# Patient Record
Sex: Male | Born: 1940
Health system: Southern US, Community
[De-identification: ages and names within clinical notes are randomized; demographics above are authoritative.]

## PROBLEM LIST (undated history)

## (undated) DIAGNOSIS — D509 Iron deficiency anemia, unspecified: Secondary | ICD-10-CM

## (undated) DIAGNOSIS — J189 Pneumonia, unspecified organism: Secondary | ICD-10-CM

## (undated) DIAGNOSIS — F32A Depression, unspecified: Secondary | ICD-10-CM

## (undated) DIAGNOSIS — K259 Gastric ulcer, unspecified as acute or chronic, without hemorrhage or perforation: Secondary | ICD-10-CM

## (undated) DIAGNOSIS — J449 Chronic obstructive pulmonary disease, unspecified: Secondary | ICD-10-CM

## (undated) DIAGNOSIS — L570 Actinic keratosis: Secondary | ICD-10-CM

## (undated) DIAGNOSIS — E039 Hypothyroidism, unspecified: Secondary | ICD-10-CM

## (undated) DIAGNOSIS — M810 Age-related osteoporosis without current pathological fracture: Secondary | ICD-10-CM

## (undated) DIAGNOSIS — G8929 Other chronic pain: Secondary | ICD-10-CM

## (undated) DIAGNOSIS — K3580 Unspecified acute appendicitis: Secondary | ICD-10-CM

## (undated) DIAGNOSIS — E785 Hyperlipidemia, unspecified: Secondary | ICD-10-CM

## (undated) DIAGNOSIS — K5730 Diverticulosis of large intestine without perforation or abscess without bleeding: Secondary | ICD-10-CM

## (undated) DIAGNOSIS — C679 Malignant neoplasm of bladder, unspecified: Secondary | ICD-10-CM

## (undated) DIAGNOSIS — Z8719 Personal history of other diseases of the digestive system: Secondary | ICD-10-CM

## (undated) DIAGNOSIS — F329 Major depressive disorder, single episode, unspecified: Secondary | ICD-10-CM

## (undated) DIAGNOSIS — M1990 Unspecified osteoarthritis, unspecified site: Secondary | ICD-10-CM

## (undated) DIAGNOSIS — I10 Essential (primary) hypertension: Secondary | ICD-10-CM

## (undated) DIAGNOSIS — M545 Low back pain, unspecified: Secondary | ICD-10-CM

## (undated) DIAGNOSIS — M48062 Spinal stenosis, lumbar region with neurogenic claudication: Secondary | ICD-10-CM

## (undated) HISTORY — PX: JOINT REPLACEMENT: SHX530

## (undated) HISTORY — PX: FIXATION KYPHOPLASTY LUMBAR SPINE: SHX1642

## (undated) HISTORY — DX: Actinic keratosis: L57.0

## (undated) HISTORY — PX: APPENDECTOMY: SHX54

## (undated) HISTORY — PX: CARDIAC CATHETERIZATION: SHX172

## (undated) HISTORY — PX: BACK SURGERY: SHX140

---

## 2002-05-13 ENCOUNTER — Encounter: Payer: Self-pay | Admitting: Orthopedic Surgery

## 2002-05-17 ENCOUNTER — Encounter: Payer: Self-pay | Admitting: Orthopedic Surgery

## 2002-05-17 ENCOUNTER — Inpatient Hospital Stay (HOSPITAL_COMMUNITY): Admission: RE | Admit: 2002-05-17 | Discharge: 2002-05-20 | Payer: Self-pay | Admitting: Orthopedic Surgery

## 2002-05-17 HISTORY — PX: TOTAL HIP ARTHROPLASTY: SHX124

## 2004-12-06 ENCOUNTER — Ambulatory Visit: Payer: Self-pay | Admitting: Gastroenterology

## 2008-06-02 ENCOUNTER — Ambulatory Visit: Payer: Self-pay | Admitting: Gastroenterology

## 2008-11-29 ENCOUNTER — Ambulatory Visit: Payer: Self-pay | Admitting: Cardiology

## 2009-01-09 ENCOUNTER — Ambulatory Visit: Payer: Self-pay | Admitting: Gastroenterology

## 2009-02-27 ENCOUNTER — Ambulatory Visit: Payer: Self-pay | Admitting: Gastroenterology

## 2010-06-09 ENCOUNTER — Ambulatory Visit: Payer: Self-pay | Admitting: Sports Medicine

## 2010-10-25 ENCOUNTER — Ambulatory Visit
Admission: RE | Admit: 2010-10-25 | Discharge: 2010-10-25 | Payer: Self-pay | Source: Home / Self Care | Attending: Family Medicine | Admitting: Family Medicine

## 2010-11-29 NOTE — Assessment & Plan Note (Signed)
Summary: FLU SHOT/JBB  Nurse Visit   Immunizations Administered:  Influenza Vaccine:    Vaccine Type: FLULAVAL    Site: left deltoid    Mfr: GlaxoSmithKline    Dose: 0.5 ml    Route: IM    Given by: Levonne Spiller EMT-P    Exp. Date: 04/27/2011    Lot #: [id number]    VIS given: 05/22/10 version given October 25, 2010.   Immunizations Administered:  Influenza Vaccine:    Vaccine Type: FLULAVAL    Site: left deltoid    Mfr: GlaxoSmithKline    Dose: 0.5 ml    Route: IM    Given by: Levonne Spiller EMT-P    Exp. Date: 04/27/2011    Lot #: [id number]    VIS given: 05/22/10 version given October 25, 2010.  Flu Vaccine Consent Questions:    Do you have a history of severe allergic reactions to this vaccine? no    Any prior history of allergic reactions to egg and/or gelatin? no    Do you have a sensitivity to the preservative Thimersol? no    Do you have a past history of Guillan-Barre Syndrome? no    Do you currently have an acute febrile illness? no    Have you ever had a severe reaction to latex? no    Vaccine information given and explained to patient? yes

## 2011-03-12 ENCOUNTER — Ambulatory Visit: Payer: Self-pay

## 2011-03-15 ENCOUNTER — Ambulatory Visit: Payer: Self-pay | Admitting: Unknown Physician Specialty

## 2011-03-15 NOTE — Discharge Summary (Signed)
NAME:  Thomas Palmer, Thomas Palmer                  ACCOUNT NO.:  [id number]   MEDICAL RECORD NO.:  [id number]                   PATIENT TYPE:  INP   LOCATION:  5035                                 FACILITY:  [facility]   PHYSICIAN:  Jamelle Rushing, P.A.                DATE OF BIRTH:  Feb 07, 1941   DATE OF ADMISSION:  05/17/2002  DATE OF DISCHARGE:  05/20/2002                                 DISCHARGE SUMMARY   ADMISSION DIAGNOSES:  1. Osteoarthritis right hip.  2. Thyroid disease.   DISCHARGE DIAGNOSES:  1. Right total hip arthroplasty.  2. Thyroid disease.   HISTORY OF PRESENT ILLNESS:  The patient is a 70year-old white male with a  three to four-year history of gradually onset progressively worsening right  hip pain.  The pain has significantly worsened over the last six months.  It  is now a constant dull aching sensation on the posterior aspect of the hip  with sharp, shooting pains with awkward movements.  The pain is present with  laying down, sitting, and standing.  He does have night pain.  He has a  sense of catching in the hip with range of motion and has noted loss of  external rotation of the foot with pain and blocking.  He does note a  grinding sensation of the hip with ambulation.   ALLERGIES:  No known drug allergies   CURRENT MEDICATIONS:  1. Levoxyl 0.125 mg p.o. q.d.  2. Vioxx 25 mg p.o. q.d.  3. Aspirin 81 mg p.o. q.d.  4. Multivitamin 1 tablet p.o. q.d.   PAST SURGICAL HISTORY:  On May 17, 2002, the patient was taken to the  operating room by Dr. Mila Homer. Lucey, assisted by Dr. Jamelle Rushing, P.A.-  C.  Under general anesthesia, the patient underwent a right total hip  arthroplasty.  The procedure went well.  There were no complications.  There  were no drains left in place.  Estimated blood loss was 300 cc.  The patient  was transferred to the recovery room and then to the orthopedic floor in  good condition.   The following routine consults were  requested.  Physical therapy,  occupational therapy, case management.   HOSPITAL COURSE:  On May 17, 2002, the patient was admitted to [facility] under the care of Dr. Mila Homer. Lucey.  The patient was  taken to the OR where a right total hip arthroplasty was performed.  There  were no complications.  The patient tolerated the procedure well and was  transferred to the recovery room and then to the orthopedic floor for  routine postoperative care.  The patient was placed on Lovenox for routine  DVT prophylaxis.   The patient then incurred a total of three days of postoperative care on the  orthopedic floor in which the patient progressed and did very nicely with  physical therapy.  The patient had no  untorrid events.  His labs remained  stable, his vital signs stable, and he remained afebrile.  His wounds  remained benign for any signs of infection.  The leg remained  neurovascularly intact and he was very comfortable on Percocet p.r.n.   The patient worked very well and progressed very rapidly with physical  therapy and was ready for discharge home on postoperative day #3.   LABORATORY DATA:  CBC on May 20, 2002 showed a WBC of 8.7, hemoglobin 8.8,  hematocrit 25.7, platelets 149,000.  Routine chemistries on May 19, 2002  showed sodium of 129, potassium of 4.2, glucose of 158, BUN 19, creatinine  0.8.  Urinalysis on admission was normal.   DISCHARGE MEDICATIONS:  1. Colace 100 mg p.o. b.i.d.  2. Trinsicon 1 tablet p.o. t.i.d.  3. Laxative or enema of choice p.r.n.  4. Percocet 1-2 tablets q.4-6h. p.r.n.  5. Phenergan 25 mg p.o. q.4h. p.r.n.  6. Tylenol 650 mg p.o. q.4h. p.r.n.  7. Robaxin 500 mg p.o. q.6h. p.r.n.  8. Restoril 30 mg p.o. q.h.s. p.r.n.  9. Lovenox 30 mg subcutaneously q.12h.  10.      Synthroid 0.125 mg p.o. q.d.  11.      Vioxx 25 mg p.o. q.d.  12.      Multivitamin 1 tablet p.o. q.d.   DISCHARGE INSTRUCTIONS:  Continue home medications.   Percocet 1-2 tablets  q.4-6h. p.r.n.  Lovenox 40 mg injection once a day for 10 days.  Pain  management with Percocet as noted above.   ACTIVITY:  As instructed.   DIET:  No restrictions.   WOUND CARE:  Keep wound clean.  Change dressing daily.  Call if any signs of  an infection.   FOLLOW UP:  Call [phone] for an follow-up appointment in one week with Dr.  Mila Homer. Lucey.   CONDITION ON DISCHARGE:  His condition on discharge to home is listed as  improved and good.                                               Jamelle Rushing, P.A.    RWK/MEDQ  D:  06/04/2002  T:  06/09/2002  Job:  [id number]   cc:   Corliss [place]

## 2011-03-15 NOTE — H&P (Signed)
[place]  Patient:    Thomas Palmer, Thomas Palmer Visit Number: [id number] MRN: [id number]          Service Type: SUR Location: 5000 5035 01 Attending Physician:  Georgena Spurling Dictated by:   Jamelle Rushing, P.A. Admit Date:  05/17/2002                           History and Physical  DATE OF BIRTH:  06/06/1941  CHIEF COMPLAINT:  Right hip pain over the last three to four years.  HISTORY OF PRESENT ILLNESS:  The patient is a 70 year old white male with a complaint of progressively worsening right hip pain over the last three to four years.  Initially the pain was gradually increased in intensity, but since this last January it has significantly worsened at a far more rapid rate.  The patient does have a catching sensation with rotation of the hip. Otherwise he has a constant dull achy pain and with rotation and awkward turns at time, he does have a sharp shooting pain.  He does have pain at night.  The pain is located primarily in the deep right buttocks region.  He does note some loss of external rotation of the foot.  He does have a grinding sensation in the hip with ambulation.  DRUG ALLERGIES:  No known drug allergies.  CURRENT MEDICATIONS: 1. Levoxyl 0.125 mg p.o. q.d. 2. Vioxx 25 mg p.o. q.d. 3. Aspirin 81 mg p.o. q.d. 4. Multivitamins one tablet p.o. q.d.  PAST MEDICAL HISTORY:  Thyroid disease currently managed by Huntley Dec, M.D., at the [facility].  The patient denies any diabetes, hypertension, heart disease, ulcers, or respiratory problems.  PAST SURGICAL HISTORY:  None.  SOCIAL HISTORY:  The patient is a 70 year old white male who appears to be healthy.  He denies any history of smoking.  He does have one or two beers on a daily basis.  He is married.  He does have one daughter.  He lives in a [place] house.  He is currently employed in [employer].  His family physician is Huntley Dec, M.D., at the [facility],  [phone].  FAMILY MEDICAL HISTORY:  His mother is deceased from kidney failure due to hypertension.  His father is deceased from a heart attack.  The patient has one sister deceased from an acute asthma attack.  The patient has two sisters alive, both in good health.  The patient does have a strong family history of diabetes on his fathers side.  REVIEW OF SYSTEMS:  Positive for using reading glasses.  He does occasionally have shortness of breath with exertion, which is related to discomfort in his hip and loss of physical conditioning.  Otherwise the patient denies any other problems and any other sensory, respiratory, cardiac, GI, GU, hematologic, musculoskeletal, neurologic, or mental status problems not mentioned above.  PHYSICAL EXAMINATION:  Height is 5 feet 11 inches.  Weight is 205 pounds.  VITAL SIGNS:  The pulse is 72 and regular, respirations 12, temperature 99.2 degrees, and blood pressure 130/86.  GENERAL APPEARANCE:  This is a healthy-appearing white male who ambulates with a right-sided limp.  He is able to get on and off the exam table without any difficulty or significant obvious discomfort.  HEENT:  The head was normocephalic and atraumatic.  Nontender over maxillary and frontal sinuses.  Pupils equal, round, reactive, and accommodating to light.  Extraocular movements intact.  The sclerae are  nonicteric.  The conjunctivae are pink and moist.  External ears without deformities.  Canals patent.  TMs pearly gray and intact.  Gross hearing is intact.  The nasal septum is midline.  Mucous membranes pink and moist.  No polyps noted.  The oral buccal mucosa was pink and moist and without lesions.  Dentition was in good repair.  He does have a permanent partial plate on the upper left side. The uvula was midline and moved symmetrically with phonation.  He was able to swallow without difficulty.  CHEST:  The lung sounds were clear and equal bilaterally.  There were  no wheezes, rhonchi, or rubs noted.  HEART:  Regular rate and rhythm.  S1 and S2 were auscultated.  No murmurs, rubs, or gallops noted.  ABDOMEN:  Round, soft, and nontender.  Bowel sounds were normoactive throughout.  He had no tenderness to deep palpation and no hepatosplenomegaly. CVA was nontender to percussion.  EXTREMITIES:  The upper extremities were symmetric in size and shape.  He had good range of motion of his shoulders, elbows, and wrists without any difficulty.  Motor strength was 5/5.  The right hip had full extension and flexion up to 110 degrees.  He had 10 degrees of external rotation and 10 degrees of internal rotation limited by discomfort.  There were no mechanical symptoms with this exam.  The left hip had full extension and flexion up to 130 degrees.  He had 20-30 degrees of internal and external rotation.  Bilateral knees were symmetric in size and shape without any signs of erythema, ecchymosis, or effusion.  He was nontender along the medial or lateral joint line.  He had full extension and flexion back to 120 degrees.  No valgus or varus laxity.  No anterior or posterior drawer.  The calves were nontender.  The ankles were symmetrical with good dorsiflexion and plantar flexion.  NEUROLOGIC:  The patient was conscious, alert, and appropriate and held an easy conversation with the examiner.  Cranial nerves II-XII were grossly intact.  Deep tendon reflexes of the upper and lower extremities were symmetrical right to left.  The patient was grossly intact to light touch sensation from head to toe.  PERIPHERAL PULSES:  Cranial pulses were 2+, radial pulses 2+, and dorsalis pedis and posterior tibial pulses were 2+.  He had no lower extremity edema. He had a few scattered varicosities, but no pigmentation changes.  BREASTS:  Exam was deferred at this time.  RECTAL:  Exam was deferred at this time.  GENITOURINARY:  Exam was deferred at this  time.  IMPRESSION: 1. Osteoarthritis of the right hip. 2. Thyroid disease.   PLAN:  The patient will be admitted to [facility] on May 17, 2002, under the care of Georgena Spurling, M.D.  The patient will undergo all of the routine labs and tests prior to have a right total hip arthroplasty by Georgena Spurling, M.D. Dictated by:   Jamelle Rushing, P.A. Attending Physician:  Georgena Spurling DD:  05/13/02 TD:  05/17/02 Job: [id number]

## 2011-03-15 NOTE — Op Note (Signed)
[place]  Patient:    Thomas Palmer, Thomas Palmer Visit Number: [id number] MRN: [id number]          Service Type: SUR Location: 5000 5035 01 Attending Physician:  Georgena Spurling Dictated by:   Georgena Spurling, M.D. Proc. Date: 05/17/02 Admit Date:  05/17/2002 Discharge Date: 05/20/2002                             Operative Report  PREOPERATIVE DIAGNOSIS:  Right hip osteoarthritis.  POSTOPERATIVE DIAGNOSIS:  Right hip osteoarthritis.  OPERATION PERFORMED:  Right total hip arthroplasty.  SURGEON:  Georgena Spurling, M.D.  ASSISTANT:  Jamelle Rushing, P.A.  ANESTHESIA:  General.  INDICATIONS FOR PROCEDURE:  The patient is a 70 year old white male status post failure of conservative measures for treatment of osteoarthritis of the knee.  Informed consent was obtained.  DESCRIPTION OF PROCEDURE:  The patient was taken to the operating room, laid supine, administered general anesthesia with Foley catheter placement.  He was then placed in a left down, right up lateral decubitus position and the right hip was prepped and draped in the usual sterile fashion.  A straight incision was made with a #10 blade approximately four inches in length.  Dissection was continued down to the fascia lata and hemostasis was obtained.  I then made a straight incision in the fascia lata along the length of the incision and then placed a Charnley retractor in place.  I then performed a bursectomy.  Then dissected off the anterior one half of the gluteus medius, gluteus minimus and vastus lateralis as one complete sleeve.  I then removed the anterior hip capsule and dislocated the hip with external rotation and flexion with the leg off into a sterile pouch.  I then used a cutting guide to mark the femoral neck and cut it with a sagittal saw and removed the arthritic femoral head.  I then brought the leg back up under the table, then external rotation and laced a Hohmann  retractor anteriorly and posteriorly.  I then used extended Bovie and forceps to remove the labrum from 360 degrees and then released some of the inferior capsule.  I then changed sides of the table and reamed sequentially up to a 56 and placed in a 58 no hole, no spike, ____________ press-fit with 2 mm press-fit.  At this point I went back to the other side of the table, flexed and externally rotated the leg off the bed again and turned our attention to the femur.  I used a canal finder, a lateralizing reamer and sequentially broached from 9 up to 15.  I had cylindrically reamed up to 15 first.  I then left a 15 broach in place and trialed it with different liners and head and chose a standard offset liner with a 0 + 0 head.  I then removed all of the trial components, irrigated copiously and then placed in a 15 fully porous coated stem, tamped on a 0 head, tamped in a standard liner and located the hip.  Testing for stability was excellent.  I then irrigated once again, closed the minimus, medius, vastus lateralis sleeve through three drill holes and then reinforced with figure-of-eight #2 Ethibond sutures as well.  I then closed the fascia lata with interrupted #1 Vicryls and deep soft tissue with interrupted 0 Vicryls, the subcuticular with 2-0 Vicryls and skin staples. Dressed with Adaptic, 4 x 4s, sterile ABD and  Ioban drape.  The patient tolerated the procedure well.  COMPLICATIONS:  None.  DRAINS:  None.  ESTIMATED BLOOD LOSS:  300 cc. Dictated by:   Georgena Spurling, M.D. Attending Physician:  Georgena Spurling DD:  05/17/02 TD:  05/20/02 Job: [id number]

## 2011-03-19 ENCOUNTER — Ambulatory Visit: Payer: Self-pay | Admitting: Unknown Physician Specialty

## 2011-03-21 ENCOUNTER — Ambulatory Visit: Payer: Self-pay

## 2011-08-29 DIAGNOSIS — J189 Pneumonia, unspecified organism: Secondary | ICD-10-CM

## 2011-08-29 HISTORY — DX: Pneumonia, unspecified organism: J18.9

## 2011-09-08 ENCOUNTER — Inpatient Hospital Stay: Payer: Self-pay | Admitting: Internal Medicine

## 2012-12-28 ENCOUNTER — Ambulatory Visit: Payer: Self-pay | Admitting: Family Medicine

## 2012-12-28 LAB — CREATININE, SERUM
Creatinine: 0.88 mg/dL (ref 0.60–1.30)
EGFR (African American): >60
EGFR (Non-African Amer.): >60

## 2013-06-29 ENCOUNTER — Encounter (HOSPITAL_COMMUNITY): Payer: Self-pay | Admitting: Pharmacy Technician

## 2013-06-30 ENCOUNTER — Other Ambulatory Visit: Payer: Self-pay | Admitting: Orthopedic Surgery

## 2013-07-07 ENCOUNTER — Encounter (HOSPITAL_COMMUNITY)
Admission: RE | Admit: 2013-07-07 | Discharge: 2013-07-07 | Disposition: A | Discharge status: Home / Self Care | Payer: Medicare Other | Source: Ambulatory Visit | Attending: Orthopedic Surgery | Admitting: Orthopedic Surgery

## 2013-07-07 ENCOUNTER — Encounter (HOSPITAL_COMMUNITY): Payer: Self-pay

## 2013-07-07 DIAGNOSIS — Z01810 Encounter for preprocedural cardiovascular examination: Secondary | ICD-10-CM | POA: Insufficient documentation

## 2013-07-07 DIAGNOSIS — Z01818 Encounter for other preprocedural examination: Secondary | ICD-10-CM | POA: Insufficient documentation

## 2013-07-07 DIAGNOSIS — Z01812 Encounter for preprocedural laboratory examination: Secondary | ICD-10-CM | POA: Insufficient documentation

## 2013-07-07 HISTORY — DX: Depression, unspecified: F32.A

## 2013-07-07 HISTORY — DX: Personal history of other diseases of the digestive system: Z87.19

## 2013-07-07 HISTORY — DX: Hypothyroidism, unspecified: E03.9

## 2013-07-07 HISTORY — DX: Unspecified osteoarthritis, unspecified site: M19.90

## 2013-07-07 HISTORY — DX: Major depressive disorder, single episode, unspecified: F32.9

## 2013-07-07 HISTORY — DX: Pneumonia, unspecified organism: J18.9

## 2013-07-07 LAB — URINALYSIS, ROUTINE W REFLEX MICROSCOPIC
Glucose, UA: NEGATIVE mg/dL
Leukocytes, UA: NEGATIVE
Nitrite: NEGATIVE
Specific Gravity, Urine: 1.013 (ref 1.005–1.030)
pH: 7.5 (ref 5.0–8.0)

## 2013-07-07 LAB — TYPE AND SCREEN: ABO/RH(D): O POS

## 2013-07-07 LAB — CBC WITH DIFFERENTIAL/PLATELET
Basophils Absolute: 0.1 10*3/uL (ref 0.0–0.1)
Basophils Relative: 1 % (ref 0–1)
Eosinophils Relative: 3 % (ref 0–5)
HCT: 46.3 % (ref 39.0–52.0)
Lymphocytes Relative: 23 % (ref 12–46)
MCHC: 32.4 g/dL (ref 30.0–36.0)
Monocytes Absolute: 0.6 10*3/uL (ref 0.1–1.0)
Neutro Abs: 4.6 10*3/uL (ref 1.7–7.7)
Platelets: 219 10*3/uL (ref 150–400)
RDW: 13.6 % (ref 11.5–15.5)
WBC: 7.1 10*3/uL (ref 4.0–10.5)

## 2013-07-07 LAB — SURGICAL PCR SCREEN
MRSA, PCR: NEGATIVE
Staphylococcus aureus: NEGATIVE

## 2013-07-07 LAB — COMPREHENSIVE METABOLIC PANEL
ALT: 15 U/L (ref 0–53)
AST: 22 U/L (ref 0–37)
Albumin: 4 g/dL (ref 3.5–5.2)
Calcium: 9.6 mg/dL (ref 8.4–10.5)
Creatinine, Ser: 0.86 mg/dL (ref 0.50–1.35)
Sodium: 137 mEq/L (ref 135–145)

## 2013-07-07 LAB — PROTIME-INR: INR: 0.95 (ref 0.00–1.49)

## 2013-07-07 LAB — ABO/RH: ABO/RH(D): O POS

## 2013-07-07 LAB — APTT: aPTT: 33 seconds (ref 24–37)

## 2013-07-07 MED ORDER — CHLORHEXIDINE GLUCONATE 4 % EX LIQD: 60.0000 mL | Freq: Once | CUTANEOUS | Status: DC

## 2013-07-07 NOTE — Pre-Procedure Instructions (Signed)
Thomas Palmer.  07/07/2013   Your procedure is scheduled on:  Monday July 12, 2013  Report to [facility] at 0800 AM.  Call this number if you have problems the morning of surgery: [phone]   Remember:   Do not eat food or drink liquids after midnight Sunday.   Take these medicines the morning of surgery with A SIP OF WATER: Synthroid and Prozac. Stop  Aspirin  And all herbal medications/supplements, and NSAIDS.   Do not wear jewelry.  Do not wear lotions, powders, or perfumes. You may wear deodorant.             Men may shave face and neck.  Do not bring valuables to the hospital.  [facility] is not responsible for any belongings or valuables.  Contacts, dentures or bridgework may not be worn into surgery.  Leave suitcase in the car. After surgery it may be brought to your room.  For patients admitted to the hospital, checkout time is 11:00 AM the day of discharge.   Patients discharged the day of surgery will not be allowed to drive home.    Special Instructions: Incentive Spirometry - Practice and bring it with you on the day of surgery. Shower using CHG 2 nights before surgery and the night before surgery.  If you shower the day of surgery use CHG.  Use special wash - you have one bottle of CHG for all showers.  You should use approximately 1/3 of the bottle for each shower.   Please read over the following fact sheets that you were given: Pain Booklet, Coughing and Deep Breathing, Blood Transfusion Information, MRSA Information and Surgical Site Infection Prevention

## 2013-07-08 LAB — URINE CULTURE
Colony Count: NO GROWTH
Culture: NO GROWTH

## 2013-07-08 NOTE — Progress Notes (Signed)
Anesthesia Chart Review:  Patient is a 72 year old male scheduled for left THA on 07/12/13 by Dr. Sherlean Foot.  History includes former smoker, hypothyroidism, depression, PNA '12, arthritis, hiatal hernia, right THA 04/2002, kyphoplasty '13.  EKG on 07/07/13 showed NSR, right BBB.  CXR on 07/07/13 showed: No acute cardiopulmonary abnormality. Suspect hyperinflation. Moderate size hiatal hernia.  Preoperative labs noted.  Urine culture is still pending.  If no acute changes then anticipate that he can proceed as planned.    Velna Ochs [facility]/Anesthesiology Phone [phone] 07/08/2013 11:25 AM

## 2013-07-11 MED ORDER — TRANEXAMIC ACID 100 MG/ML IV SOLN
1000.0000 mg | INTRAVENOUS | Status: AC
  Administered 2013-07-12: 1000 mg via INTRAVENOUS
  Filled 2013-07-11: qty 10

## 2013-07-11 MED ORDER — CEFAZOLIN SODIUM-DEXTROSE 2-3 GM-% IV SOLR
2.0000 g | INTRAVENOUS | Status: AC
  Administered 2013-07-12: 2 g via INTRAVENOUS
  Filled 2013-07-11: qty 50

## 2013-07-12 ENCOUNTER — Encounter (HOSPITAL_COMMUNITY)
Admission: RE | Disposition: A | Discharge status: Home Health | Payer: Self-pay | Source: Ambulatory Visit | Attending: Orthopedic Surgery

## 2013-07-12 ENCOUNTER — Inpatient Hospital Stay (HOSPITAL_COMMUNITY): Payer: Medicare Other

## 2013-07-12 ENCOUNTER — Inpatient Hospital Stay (HOSPITAL_COMMUNITY)
Admission: RE | Admit: 2013-07-12 | Discharge: 2013-07-13 | DRG: 470 | Disposition: A | Discharge status: Home Health | Payer: Medicare Other | Source: Ambulatory Visit | Attending: Orthopedic Surgery | Admitting: Orthopedic Surgery

## 2013-07-12 ENCOUNTER — Encounter (HOSPITAL_COMMUNITY): Payer: Self-pay | Admitting: *Deleted

## 2013-07-12 ENCOUNTER — Encounter (HOSPITAL_COMMUNITY): Payer: Self-pay | Admitting: Vascular Surgery

## 2013-07-12 DIAGNOSIS — M1610 Unilateral primary osteoarthritis, unspecified hip: Principal | ICD-10-CM | POA: Diagnosis present

## 2013-07-12 DIAGNOSIS — Z23 Encounter for immunization: Secondary | ICD-10-CM

## 2013-07-12 DIAGNOSIS — Z87891 Personal history of nicotine dependence: Secondary | ICD-10-CM

## 2013-07-12 DIAGNOSIS — F329 Major depressive disorder, single episode, unspecified: Secondary | ICD-10-CM | POA: Diagnosis present

## 2013-07-12 DIAGNOSIS — M169 Osteoarthritis of hip, unspecified: Principal | ICD-10-CM | POA: Diagnosis present

## 2013-07-12 DIAGNOSIS — Z96649 Presence of unspecified artificial hip joint: Secondary | ICD-10-CM

## 2013-07-12 DIAGNOSIS — E039 Hypothyroidism, unspecified: Secondary | ICD-10-CM | POA: Diagnosis present

## 2013-07-12 DIAGNOSIS — F3289 Other specified depressive episodes: Secondary | ICD-10-CM | POA: Diagnosis present

## 2013-07-12 DIAGNOSIS — K449 Diaphragmatic hernia without obstruction or gangrene: Secondary | ICD-10-CM | POA: Diagnosis present

## 2013-07-12 DIAGNOSIS — D62 Acute posthemorrhagic anemia: Secondary | ICD-10-CM | POA: Diagnosis not present

## 2013-07-12 HISTORY — PX: TOTAL HIP ARTHROPLASTY: SHX124

## 2013-07-12 HISTORY — DX: Low back pain, unspecified: M54.50

## 2013-07-12 HISTORY — DX: Other chronic pain: G89.29

## 2013-07-12 HISTORY — DX: Low back pain: M54.5

## 2013-07-12 LAB — CBC
HCT: 40.3 % (ref 39.0–52.0)
Hemoglobin: 13.2 g/dL (ref 13.0–17.0)
MCH: 30.6 pg (ref 26.0–34.0)
MCV: 93.3 fL (ref 78.0–100.0)
Platelets: 183 10*3/uL (ref 150–400)
RBC: 4.32 MIL/uL (ref 4.22–5.81)
WBC: 11.9 10*3/uL — ABNORMAL HIGH (ref 4.0–10.5)

## 2013-07-12 LAB — CREATININE, SERUM: GFR calc Af Amer: >90 mL/min (ref >90)

## 2013-07-12 SURGERY — ARTHROPLASTY, HIP, TOTAL,POSTERIOR APPROACH
Anesthesia: General | Site: Hip | Laterality: Left | Wound class: Clean

## 2013-07-12 MED ORDER — TESTOSTERONE 12.5 MG/ACT (1%) TD GEL: 1.0000 | Freq: Every day | TRANSDERMAL | Status: DC

## 2013-07-12 MED ORDER — DOCUSATE SODIUM 100 MG PO CAPS
100.0000 mg | ORAL_CAPSULE | Freq: Two times a day (BID) | ORAL | Status: DC
  Administered 2013-07-12 – 2013-07-13 (×2): 100 mg via ORAL
  Filled 2013-07-12 (×3): qty 1

## 2013-07-12 MED ORDER — HYDROMORPHONE HCL PF 1 MG/ML IJ SOLN
0.2500 mg | INTRAMUSCULAR | Status: DC | PRN
  Administered 2013-07-12 (×4): 0.5 mg via INTRAVENOUS

## 2013-07-12 MED ORDER — METOCLOPRAMIDE HCL 10 MG PO TABS: 5.0000 mg | ORAL_TABLET | Freq: Three times a day (TID) | ORAL | Status: DC | PRN

## 2013-07-12 MED ORDER — CELECOXIB 200 MG PO CAPS
200.0000 mg | ORAL_CAPSULE | Freq: Two times a day (BID) | ORAL | Status: DC
  Administered 2013-07-12: 200 mg via ORAL
  Filled 2013-07-12 (×3): qty 1

## 2013-07-12 MED ORDER — PROPOFOL 10 MG/ML IV BOLUS
INTRAVENOUS | Status: DC | PRN
  Administered 2013-07-12: 150 mg via INTRAVENOUS
  Administered 2013-07-12: 50 mg via INTRAVENOUS

## 2013-07-12 MED ORDER — METOCLOPRAMIDE HCL 5 MG/ML IJ SOLN: 5.0000 mg | Freq: Three times a day (TID) | INTRAMUSCULAR | Status: DC | PRN

## 2013-07-12 MED ORDER — LUTEIN 20 MG PO CAPS: 1.0000 | ORAL_CAPSULE | Freq: Every day | ORAL | Status: DC

## 2013-07-12 MED ORDER — LEVOTHYROXINE SODIUM 137 MCG PO TABS
137.0000 ug | ORAL_TABLET | Freq: Every day | ORAL | Status: DC
  Administered 2013-07-13: 137 ug via ORAL
  Filled 2013-07-12 (×2): qty 1

## 2013-07-12 MED ORDER — ONDANSETRON HCL 4 MG PO TABS: 4.0000 mg | ORAL_TABLET | Freq: Four times a day (QID) | ORAL | Status: DC | PRN

## 2013-07-12 MED ORDER — OXYCODONE HCL 5 MG PO TABS
5.0000 mg | ORAL_TABLET | ORAL | Status: DC | PRN
  Administered 2013-07-12: 5 mg via ORAL
  Administered 2013-07-13: 10 mg via ORAL
  Administered 2013-07-13 (×2): 5 mg via ORAL
  Administered 2013-07-13: 10 mg via ORAL
  Filled 2013-07-12 (×2): qty 1
  Filled 2013-07-12 (×2): qty 2
  Filled 2013-07-12: qty 1

## 2013-07-12 MED ORDER — ROCURONIUM BROMIDE 100 MG/10ML IV SOLN
INTRAVENOUS | Status: DC | PRN
  Administered 2013-07-12: 40 mg via INTRAVENOUS
  Administered 2013-07-12: 10 mg via INTRAVENOUS

## 2013-07-12 MED ORDER — ALUM & MAG HYDROXIDE-SIMETH 200-200-20 MG/5ML PO SUSP: 30.0000 mL | ORAL | Status: DC | PRN

## 2013-07-12 MED ORDER — HYDROMORPHONE HCL PF 1 MG/ML IJ SOLN: 1.0000 mg | INTRAMUSCULAR | Status: DC | PRN

## 2013-07-12 MED ORDER — ACETAMINOPHEN 500 MG PO TABS
1000.0000 mg | ORAL_TABLET | Freq: Once | ORAL | Status: AC
  Administered 2013-07-12: 1000 mg via ORAL
  Filled 2013-07-12: qty 2

## 2013-07-12 MED ORDER — HYDROMORPHONE HCL PF 1 MG/ML IJ SOLN
INTRAMUSCULAR | Status: AC
  Filled 2013-07-12: qty 1

## 2013-07-12 MED ORDER — ONDANSETRON HCL 4 MG/2ML IJ SOLN: 4.0000 mg | Freq: Once | INTRAMUSCULAR | Status: DC | PRN

## 2013-07-12 MED ORDER — SENNOSIDES-DOCUSATE SODIUM 8.6-50 MG PO TABS: 1.0000 | ORAL_TABLET | Freq: Every evening | ORAL | Status: DC | PRN

## 2013-07-12 MED ORDER — FENTANYL CITRATE 0.05 MG/ML IJ SOLN
INTRAMUSCULAR | Status: DC | PRN
  Administered 2013-07-12: 150 ug via INTRAVENOUS
  Administered 2013-07-12 (×2): 100 ug via INTRAVENOUS

## 2013-07-12 MED ORDER — ENOXAPARIN SODIUM 40 MG/0.4ML ~~LOC~~ SOLN
40.0000 mg | SUBCUTANEOUS | Status: DC
  Administered 2013-07-13: 40 mg via SUBCUTANEOUS
  Filled 2013-07-12 (×2): qty 0.4

## 2013-07-12 MED ORDER — SODIUM CHLORIDE 0.9 % IR SOLN
Status: DC | PRN
  Administered 2013-07-12: 1000 mL

## 2013-07-12 MED ORDER — BUPIVACAINE LIPOSOME 1.3 % IJ SUSP
20.0000 mL | Freq: Once | INTRAMUSCULAR | Status: DC
  Filled 2013-07-12: qty 20

## 2013-07-12 MED ORDER — LIDOCAINE HCL (CARDIAC) 20 MG/ML IV SOLN
INTRAVENOUS | Status: DC | PRN
  Administered 2013-07-12: 70 mg via INTRAVENOUS

## 2013-07-12 MED ORDER — OXYCODONE HCL ER 10 MG PO T12A
10.0000 mg | EXTENDED_RELEASE_TABLET | Freq: Two times a day (BID) | ORAL | Status: DC
  Administered 2013-07-12 – 2013-07-13 (×2): 10 mg via ORAL
  Filled 2013-07-12 (×2): qty 1

## 2013-07-12 MED ORDER — INFLUENZA VAC SPLIT QUAD 0.5 ML IM SUSP
0.5000 mL | INTRAMUSCULAR | Status: AC
  Administered 2013-07-13: 0.5 mL via INTRAMUSCULAR
  Filled 2013-07-12: qty 0.5

## 2013-07-12 MED ORDER — FLUOXETINE HCL 20 MG PO CAPS
20.0000 mg | ORAL_CAPSULE | Freq: Every day | ORAL | Status: DC
  Administered 2013-07-12 – 2013-07-13 (×2): 20 mg via ORAL
  Filled 2013-07-12 (×2): qty 1

## 2013-07-12 MED ORDER — PNEUMOCOCCAL VAC POLYVALENT 25 MCG/0.5ML IJ INJ
0.5000 mL | INJECTION | INTRAMUSCULAR | Status: AC
  Administered 2013-07-13: 0.5 mL via INTRAMUSCULAR
  Filled 2013-07-12: qty 0.5

## 2013-07-12 MED ORDER — ZOLPIDEM TARTRATE 5 MG PO TABS: 5.0000 mg | ORAL_TABLET | Freq: Every evening | ORAL | Status: DC | PRN

## 2013-07-12 MED ORDER — TESTOSTERONE 50 MG/5GM (1%) TD GEL: 5.0000 g | Freq: Every day | TRANSDERMAL | Status: DC

## 2013-07-12 MED ORDER — ACETAMINOPHEN 650 MG RE SUPP: 650.0000 mg | Freq: Four times a day (QID) | RECTAL | Status: DC | PRN

## 2013-07-12 MED ORDER — ONDANSETRON HCL 4 MG/2ML IJ SOLN: 4.0000 mg | Freq: Four times a day (QID) | INTRAMUSCULAR | Status: DC | PRN

## 2013-07-12 MED ORDER — PHENOL 1.4 % MT LIQD: 1.0000 | OROMUCOSAL | Status: DC | PRN

## 2013-07-12 MED ORDER — MENTHOL 3 MG MT LOZG: 1.0000 | LOZENGE | OROMUCOSAL | Status: DC | PRN

## 2013-07-12 MED ORDER — VECURONIUM BROMIDE 10 MG IV SOLR
INTRAVENOUS | Status: DC | PRN
  Administered 2013-07-12: 2 mg via INTRAVENOUS
  Administered 2013-07-12: 3 mg via INTRAVENOUS

## 2013-07-12 MED ORDER — DIPHENHYDRAMINE HCL 12.5 MG/5ML PO ELIX: 12.5000 mg | ORAL_SOLUTION | ORAL | Status: DC | PRN

## 2013-07-12 MED ORDER — SODIUM CHLORIDE 0.9 % IV SOLN: INTRAVENOUS | Status: DC

## 2013-07-12 MED ORDER — ACETAMINOPHEN 325 MG PO TABS: 650.0000 mg | ORAL_TABLET | Freq: Four times a day (QID) | ORAL | Status: DC | PRN

## 2013-07-12 MED ORDER — METHOCARBAMOL 100 MG/ML IJ SOLN
500.0000 mg | Freq: Four times a day (QID) | INTRAVENOUS | Status: DC | PRN
  Filled 2013-07-12: qty 5

## 2013-07-12 MED ORDER — MIDAZOLAM HCL 5 MG/5ML IJ SOLN
INTRAMUSCULAR | Status: DC | PRN
  Administered 2013-07-12: 2 mg via INTRAVENOUS

## 2013-07-12 MED ORDER — ONDANSETRON HCL 4 MG/2ML IJ SOLN
INTRAMUSCULAR | Status: DC | PRN
  Administered 2013-07-12: 4 mg via INTRAVENOUS

## 2013-07-12 MED ORDER — LACTATED RINGERS IV SOLN
INTRAVENOUS | Status: DC
  Administered 2013-07-12 (×2): via INTRAVENOUS

## 2013-07-12 MED ORDER — BISACODYL 5 MG PO TBEC: 5.0000 mg | DELAYED_RELEASE_TABLET | Freq: Every day | ORAL | Status: DC | PRN

## 2013-07-12 MED ORDER — NEOSTIGMINE METHYLSULFATE 1 MG/ML IJ SOLN
INTRAMUSCULAR | Status: DC | PRN
  Administered 2013-07-12: 2.5 mg via INTRAVENOUS

## 2013-07-12 MED ORDER — METHOCARBAMOL 500 MG PO TABS
500.0000 mg | ORAL_TABLET | Freq: Four times a day (QID) | ORAL | Status: DC | PRN
  Administered 2013-07-13 (×2): 500 mg via ORAL
  Filled 2013-07-12: qty 1

## 2013-07-12 MED ORDER — EPHEDRINE SULFATE 50 MG/ML IJ SOLN
INTRAMUSCULAR | Status: DC | PRN
  Administered 2013-07-12: 15 mg via INTRAVENOUS

## 2013-07-12 MED ORDER — CEFAZOLIN SODIUM-DEXTROSE 2-3 GM-% IV SOLR
2.0000 g | Freq: Four times a day (QID) | INTRAVENOUS | Status: AC
  Administered 2013-07-12 (×2): 2 g via INTRAVENOUS
  Filled 2013-07-12 (×2): qty 50

## 2013-07-12 MED ORDER — CHLORHEXIDINE GLUCONATE 4 % EX LIQD: 60.0000 mL | Freq: Once | CUTANEOUS | Status: DC

## 2013-07-12 MED ORDER — GLYCOPYRROLATE 0.2 MG/ML IJ SOLN
INTRAMUSCULAR | Status: DC | PRN
  Administered 2013-07-12: .5 mg via INTRAVENOUS

## 2013-07-12 MED ORDER — FLEET ENEMA 7-19 GM/118ML RE ENEM: 1.0000 | ENEMA | Freq: Once | RECTAL | Status: AC | PRN

## 2013-07-12 SURGICAL SUPPLY — 54 items
BLADE SAW RECIP 87.9 MT (BLADE) ×2 IMPLANT
BRUSH FEMORAL CANAL (MISCELLANEOUS) IMPLANT
CABLE CERLAGE W/CRIMP 1.8 (Cable) ×2 IMPLANT
CAP POR ST CUP XLPE LN STD HD ×2 IMPLANT
CLOTH BEACON ORANGE TIMEOUT ST (SAFETY) ×2 IMPLANT
COVER BACK TABLE 24X17X13 BIG (DRAPES) IMPLANT
COVER SURGICAL LIGHT HANDLE (MISCELLANEOUS) ×2 IMPLANT
DRAPE HIP W/POCKET STRL (DRAPE) ×2 IMPLANT
DRAPE INCISE IOBAN 66X45 STRL (DRAPES) ×2 IMPLANT
DRAPE U-SHAPE 47X51 STRL (DRAPES) ×2 IMPLANT
DRILL BIT 7/64X5 (BIT) ×2 IMPLANT
DRSG MEPILEX BORDER 4X12 (GAUZE/BANDAGES/DRESSINGS) ×2 IMPLANT
DRSG MEPILEX BORDER 4X8 (GAUZE/BANDAGES/DRESSINGS) IMPLANT
DURAPREP 26ML APPLICATOR (WOUND CARE) ×2 IMPLANT
ELECT BLADE 4.0 EZ CLEAN MEGAD (MISCELLANEOUS) ×2
ELECT CAUTERY BLADE 6.4 (BLADE) ×2 IMPLANT
ELECT REM PT RETURN 9FT ADLT (ELECTROSURGICAL) ×2
ELECTRODE BLDE 4.0 EZ CLN MEGD (MISCELLANEOUS) ×1 IMPLANT
ELECTRODE REM PT RTRN 9FT ADLT (ELECTROSURGICAL) ×1 IMPLANT
FACESHIELD LNG OPTICON STERILE (SAFETY) ×2 IMPLANT
GLOVE BIOGEL M 7.0 STRL (GLOVE) IMPLANT
GLOVE BIOGEL PI IND STRL 7.5 (GLOVE) IMPLANT
GLOVE BIOGEL PI IND STRL 8.5 (GLOVE) ×2 IMPLANT
GLOVE BIOGEL PI INDICATOR 7.5 (GLOVE)
GLOVE BIOGEL PI INDICATOR 8.5 (GLOVE) ×2
GLOVE SURG ORTHO 8.0 STRL STRW (GLOVE) ×4 IMPLANT
GOWN PREVENTION PLUS XLARGE (GOWN DISPOSABLE) ×2 IMPLANT
GOWN STRL NON-REIN LRG LVL3 (GOWN DISPOSABLE) ×4 IMPLANT
HANDPIECE INTERPULSE COAX TIP (DISPOSABLE)
HOOD PEEL AWAY FACE SHEILD DIS (HOOD) ×8 IMPLANT
KIT BASIN OR (CUSTOM PROCEDURE TRAY) ×2 IMPLANT
KIT ROOM TURNOVER OR (KITS) ×2 IMPLANT
MANIFOLD NEPTUNE II (INSTRUMENTS) ×2 IMPLANT
NDL SUT 2 .5 CRC MAYO 1.732X (NEEDLE) ×1 IMPLANT
NEEDLE 21 GA WING INFUSION (NEEDLE) ×2 IMPLANT
NEEDLE MAYO TAPER (NEEDLE) ×1
NS IRRIG 1000ML POUR BTL (IV SOLUTION) ×2 IMPLANT
PACK TOTAL JOINT (CUSTOM PROCEDURE TRAY) ×2 IMPLANT
PAD ARMBOARD 7.5X6 YLW CONV (MISCELLANEOUS) ×4 IMPLANT
PASSER SUT SWANSON 36MM LOOP (INSTRUMENTS) ×2 IMPLANT
PILLOW ABDUCTION HIP (SOFTGOODS) IMPLANT
PRESSURIZER FEMORAL UNIV (MISCELLANEOUS) IMPLANT
SET HNDPC FAN SPRY TIP SCT (DISPOSABLE) IMPLANT
STAPLER VISISTAT 35W (STAPLE) ×2 IMPLANT
SUT ETHIBOND 2 V 37 (SUTURE) ×2 IMPLANT
SUT VIC AB 0 CTX 27 (SUTURE) ×4 IMPLANT
SUT VIC AB 1 CTB1 27 (SUTURE) ×4 IMPLANT
SUT VIC AB 2-0 CTB1 (SUTURE) ×2 IMPLANT
SYR 50ML LL SCALE MARK (SYRINGE) ×2 IMPLANT
TOWEL OR 17X24 6PK STRL BLUE (TOWEL DISPOSABLE) ×2 IMPLANT
TOWEL OR 17X26 10 PK STRL BLUE (TOWEL DISPOSABLE) ×2 IMPLANT
TOWER CARTRIDGE SMART MIX (DISPOSABLE) IMPLANT
TRAY FOLEY CATH 16FRSI W/METER (SET/KITS/TRAYS/PACK) ×2 IMPLANT
WATER STERILE IRR 1000ML POUR (IV SOLUTION) ×8 IMPLANT

## 2013-07-12 NOTE — H&P (Signed)
  NAME: Thomas Palmer. MRN:   [id number] DOB:   1941/03/07     HISTORY AND PHYSICAL  CHIEF COMPLAINT:  Left hip pain  HISTORY:   GERHARDT GLEED Palmeris a 72 y.o. male  with left  Hip Pain Patient complains of left hip pain. Onset of the symptoms was several years ago. Inciting event: known DJD. The patient reports the hip pain is worse with weight bearing. Associated symptoms: none. Aggravating symptoms include: any weight bearing, going up and down stairs and inactivity. Patient has had no prior hip problems. Previous visits for this problem: yes, last seen several weeks ago by me. Evaluation to date: plain films, which were abnormal  osteoarthrtis. Treatment to date: OTC analgesics, which have been not very effective, prescription analgesics, which have been not very effective, home exercise program, which has been not very effective and physical therapy, which has been not very effective.   Plan for left total hip replacement  PAST MEDICAL HISTORY:   Past Medical History  Diagnosis Date  . Hypothyroidism   . Depression   . Pneumonia 2012  . Arthritis   . H/O hiatal hernia     PAST SURGICAL HISTORY:   Past Surgical History  Procedure Laterality Date  . Joint replacement Right 2002    hip  . Back surgery  2013    kyphoplasty    MEDICATIONS:   No prescriptions prior to admission    ALLERGIES:  No Known Allergies  REVIEW OF SYSTEMS:   Negative except Thomas Palmer  FAMILY HISTORY:  No family history on file.  SOCIAL HISTORY:   reports that he quit smoking about 24 years ago. His smoking use included Pipe. He has never used smokeless tobacco. He reports that  drinks alcohol. His drug history is not on file.  PHYSICAL EXAM:  General appearance: alert, cooperative and no distress Resp: clear to auscultation bilaterally Cardio: regular rate and rhythm, S1, S2 normal, no murmur, click, rub or gallop Extremities: extremities normal, atraumatic, no cyanosis or edema and Homans  sign is negative, no sign of DVT Pulses: 2+ and symmetric Skin: Skin color, texture, turgor normal. No rashes or lesions Neurologic: Alert and oriented X 3, normal strength and tone. Normal symmetric reflexes. Normal coordination and gait    LABORATORY STUDIES: No results found for this basename: WBC, HGB, HCT, PLT,  in the last 72 hours  No results found for this basename: NA, K, CL, CO2, GLUCOSE, BUN, CREATININE, CALCIUM,  in the last 72 hours  STUDIES/RESULTS:  Dg Chest 2 View  07/07/2013   CLINICAL DATA:  72 year old male preoperative study for hip surgery. Hiatal hernia.  EXAM: CHEST  2 VIEW  COMPARISON:  None.  FINDINGS: Moderate size gastric hiatal hernia suspected. Other mediastinal contours are within normal limits. Previous lumbar spinal compression fracture status post augmentation. Somewhat large lung volumes. No pneumothorax, pulmonary edema, pleural effusion or confluent pulmonary opacity. Flowing osteophytes worsened asthma fight in the thoracic spine.  IMPRESSION: No acute cardiopulmonary abnormality. Suspect hyperinflation. Moderate size hiatal hernia.   Electronically Signed   By: Augusto Gamble M.D.   On: 07/07/2013 13:52    ASSESSMENT:  End stage osteoarthritis left hip        Active Problems:   * No active hospital problems. *    PLAN:  Left Primary Total Hip   Thomas Palmer 07/12/2013. 7:03 AM

## 2013-07-12 NOTE — Anesthesia Preprocedure Evaluation (Addendum)
Anesthesia Evaluation  Patient identified by MRN, date of birth, ID band Patient awake    Reviewed: Allergy & Precautions, H&P , NPO status , Patient's Chart, lab work & pertinent test results, reviewed documented beta blocker date and time   Airway       Dental   Pulmonary former smoker,          Cardiovascular Exercise Tolerance: Good II    Neuro/Psych    GI/Hepatic Neg liver ROS, hiatal hernia,   Endo/Other    Renal/GU negative Renal ROS     Musculoskeletal negative musculoskeletal ROS (+)   Abdominal   Peds  Hematology negative hematology ROS (+)   Anesthesia Other Findings   Reproductive/Obstetrics                          Anesthesia Physical Anesthesia Plan  ASA: II  Anesthesia Plan:    Post-op Pain Management:    Induction:   Airway Management Planned:   Additional Equipment:   Intra-op Plan:   Post-operative Plan:   Informed Consent:   Plan Discussed with:   Anesthesia Plan Comments:         Anesthesia Quick Evaluation

## 2013-07-12 NOTE — Anesthesia Procedure Notes (Signed)
Procedure Name: Intubation Date/Time: 07/12/2013 10:49 AM Performed by: Whitman Hero Pre-anesthesia Checklist: Patient identified, Emergency Drugs available, Suction available, Patient being monitored and Timeout performed Patient Re-evaluated:Patient Re-evaluated prior to inductionOxygen Delivery Method: Circle system utilized Preoxygenation: Pre-oxygenation with 100% oxygen Intubation Type: IV induction Ventilation: Mask ventilation without difficulty Laryngoscope Size: Mac and 3 Grade View: Grade I Tube type: Oral Tube size: 7.5 mm Number of attempts: 1 Airway Equipment and Method: Stylet Placement Confirmation: ETT inserted through vocal cords under direct vision,  positive ETCO2 and breath sounds checked- equal and bilateral Secured at: 22 cm Tube secured with: Tape Dental Injury: Teeth and Oropharynx as per pre-operative assessment

## 2013-07-12 NOTE — Transfer of Care (Signed)
Immediate Anesthesia Transfer of Care Note  Patient: Thomas Palmer.  Procedure(s) Performed: Procedure(s): TOTAL HIP ARTHROPLASTY (Left)  Patient Location: PACU  Anesthesia Type:General  Level of Consciousness: alert  and sedated  Airway & Oxygen Therapy: Patient Spontanous Breathing and Patient connected to face mask  Post-op Assessment: Report given to PACU RN and Post -op Vital signs reviewed and stable  Post vital signs: Reviewed and stable  Complications: No apparent anesthesia complications

## 2013-07-12 NOTE — Anesthesia Postprocedure Evaluation (Signed)
  Anesthesia Post-op Note  Patient: Thomas Palmer.  Procedure(s) Performed: Procedure(s): TOTAL HIP ARTHROPLASTY (Left)  Patient Location: PACU  Anesthesia Type:General  Level of Consciousness: awake, alert  and oriented  Airway and Oxygen Therapy: Patient Spontanous Breathing and Patient connected to nasal cannula oxygen  Post-op Pain: mild  Post-op Assessment: Post-op Vital signs reviewed, Patient's Cardiovascular Status Stable, Respiratory Function Stable, Patent Airway and Pain level controlled  Post-op Vital Signs: stable  Complications: No apparent anesthesia complications

## 2013-07-12 NOTE — Progress Notes (Signed)
Report given to rebecca slade rn as caregiver 

## 2013-07-12 NOTE — Progress Notes (Signed)
UR COMPLETED  

## 2013-07-12 NOTE — Op Note (Signed)
Total Hip Replacement Operative Report:  07/12/2013  3:24 PM  PATIENT:  Thomas Palmer.  72 y.o. male  PRE-OPERATIVE DIAGNOSIS:  osteoarthritis left hip  POST-OPERATIVE DIAGNOSIS:  osteoarthritis left hip  PROCEDURE:  Procedure(s): TOTAL HIP ARTHROPLASTY  SURGEON:  Surgeon(s): Dannielle Huh, MD  PHYSICIAN ASSISTANT: Altamese Cabal, [facility]  ANESTHESIA:   general  DRAINS: Hemovac and On-Q Marcaine Pain Pump  SPECIMEN: None  COUNTS:  Correct  DICTATION:   Indication for procedure:    The patient is a 72 y.o. male who has failed conservative measures for @PREOPDX @.  Informed consent was obtained prior to anesthesia.  The risks versus benefits of the surgery was explained in terms that they can and did understand.  Description:  The patient was taken to the operating and administered general anesthesia.  They were then place in the lateral decubitis position with an axillary roll under the axilla and all areas padded.  The hip was prepped and draped in the usual sterile fashion.  A curvilinear incision was made with a #10 blade.  A new blade was used to incise the adipose layer down to the fascia.  The cautery was used to obtain hemostasis in this layer.  The bovie was used to divide the fascia lata and a charnley retractor was placed to exposed the lateral aspect of the hip.  The anterior one half of the gluteus medius and vastus lateralis was incised and a continuous sleeve elevated of the femur, including the gluteus minimus.  This sleeve was tagged with #2 Ethibond sutures. A large curved homan was used to protect this sleeve and expose the anterior hip joint capsule.  An anterior hip capsulectomy was performed with the bovie and forceps.  The leg was extended and externally rotatated.  The neck cutting guide was laid on the femoral neck and marked.  The neck was then cut with a reciprocating saw.  The head and neck segment was removed with a reaming corkscrew device.  The  curved homan was repositioned over the anterior acetabular rim.  A second curved homan was place over the posterior acetabular lip and retracted the femur.  The labral was removed circumferentially with the bovie.  I then switched sides of the table to the front of the patient.  The acetabulum was reamed with the reamers up to 58 mm.  A trial was then placed and checked for stability and sizing.  The trial was then removed.  The fiber mesh titanium acetabular component was then opened and was tamped into place in appropriate abduction and anteversion.  I then went back to the posterior side of the table to prepare the femur.  The leg was flexed at the hip and knee and place in a sterile pouch off the table, exposing the cut surface of the femoral neck. A Mueller retractor was used to deliver the femoral neck for reaming.  The lateralizing reamer was used first to prevent varus positioning of the component.  I then sequentially ream the canal up to 15 mm. I then broached the femur up to the corresponding size to match the reamer.  I then used the calcar planer to plan the portion of the proud femoral neck above the final trial broach.  A +0 head by 32mm head was then trialed with reduction of the hip and full range of motion checked for adequate stability.  I then dislocated the hip and removed the trial femoral components.  I then copiously irrigated.  The size  15 femoral component was opened and then tamped down into the femoral canal with a mallet.  The +0 by 32mm head was tamped onto the clean morse taper of the femoral component.  The hip was then reduced and tested for stability.  I then repaired the vastus lateralis/gluteus medius/gluteus minimus sleeve to the trochanter through small drill holes.  I oversewed the interval with several figure eight #2 tevdec sutures.  The charnley retractor was removed and the fascia lata closed with a running #1 vicryl stitch.    The deep soft tissues were closed with  burried #0 vicryl sutures.  The subcutilar layer was closed with a running #2 vicryl stitch.  The skin was closed with skin staples.  The wound was dressed with a mepilex dressing.  The patient was then laid supine, awakened, extubated, and taken to the recovery room in stable condition.  EBL:  300cc DRAINS: None COMPLICATIONS:  None

## 2013-07-13 ENCOUNTER — Encounter (HOSPITAL_COMMUNITY): Payer: Self-pay | Admitting: Orthopedic Surgery

## 2013-07-13 LAB — BASIC METABOLIC PANEL
BUN: 12 mg/dL (ref 6–23)
CO2: 30 mEq/L (ref 19–32)
Chloride: 99 mEq/L (ref 96–112)
Creatinine, Ser: 0.82 mg/dL (ref 0.50–1.35)
GFR calc Af Amer: >90 mL/min (ref >90)
Glucose, Bld: 99 mg/dL (ref 70–99)
Potassium: 5 mEq/L (ref 3.5–5.1)

## 2013-07-13 LAB — CBC
HCT: 36.4 % — ABNORMAL LOW (ref 39.0–52.0)
Hemoglobin: 11.7 g/dL — ABNORMAL LOW (ref 13.0–17.0)
MCV: 93.1 fL (ref 78.0–100.0)
RBC: 3.91 MIL/uL — ABNORMAL LOW (ref 4.22–5.81)
RDW: 13.9 % (ref 11.5–15.5)
WBC: 8.2 10*3/uL (ref 4.0–10.5)

## 2013-07-13 MED ORDER — ENOXAPARIN SODIUM 40 MG/0.4ML ~~LOC~~ SOLN: 40.0000 mg | SUBCUTANEOUS | Status: DC

## 2013-07-13 MED ORDER — OXYCODONE HCL ER 10 MG PO T12A: 10.0000 mg | EXTENDED_RELEASE_TABLET | Freq: Two times a day (BID) | ORAL | Status: DC

## 2013-07-13 MED ORDER — OXYCODONE HCL 5 MG PO TABS: 5.0000 mg | ORAL_TABLET | ORAL | Status: DC | PRN

## 2013-07-13 MED ORDER — METHOCARBAMOL 500 MG PO TABS: 500.0000 mg | ORAL_TABLET | Freq: Four times a day (QID) | ORAL | Status: DC | PRN

## 2013-07-13 NOTE — Evaluation (Signed)
Physical Therapy Evaluation Patient Details Name: Thomas Palmer. MRN: [id number] DOB: September 05, 1941 Today's Date: 07/13/2013 Time: 4098-1191 PT Time Calculation (min): 23 min  PT Assessment / Plan / Recommendation History of Present Illness  s/p left anterior lateral THA  Clinical Impression  Pt is s/p left anterior lateral THA resulting in the deficits listed below (see PT Problem List). Plan to perform stair negotiation this PM.  Pt will benefit from skilled PT to increase their independence and safety with mobility to allow discharge to the venue listed below.      PT Assessment  Patient needs continued PT services    Follow Up Recommendations  Home health PT    Equipment Recommendations  None recommended by PT    Frequency 7X/week    Precautions / Restrictions Precautions Precautions: Anterior Hip;Fall Precaution Booklet Issued: Yes (comment) Precaution Comments: Anterior handout given and reviewed. Restrictions Weight Bearing Restrictions: Yes LLE Weight Bearing: Weight bearing as tolerated   Pertinent Vitals/Pain 6/10 left hip      Mobility  Bed Mobility Bed Mobility: Sit to Supine Sit to Supine: 4: Min assist;HOB flat Details for Bed Mobility Assistance: (A) to elevate LLE into bed with cues for proper technique to maintain hip precautions. Transfers Transfers: Sit to Stand;Stand to Sit Sit to Stand: 4: Min guard;From chair/3-in-1 Stand to Sit: 4: Min guard;To bed Details for Transfer Assistance: Minguard for safety with cues for hand placement Ambulation/Gait Ambulation/Gait Assistance: 4: Min guard;5: Supervision Ambulation Distance (Feet): 125 Feet Assistive device: Rolling walker Ambulation/Gait Assistance Details: Minguard for safety with cues for step to gait per MD orders.  Gait Pattern: Step-to pattern;Decreased stride length;Antalgic Gait velocity: decreased due to step to gait Stairs: No    Exercises Total Joint Exercises Ankle  Circles/Pumps: AROM;Both;10 reps Quad Sets: Strengthening;Both;5 reps Short Arc Quad: Strengthening;Left;5 reps Heel Slides: Strengthening;AAROM;5 reps;Left   PT Diagnosis: Difficulty walking  PT Problem List: Decreased strength;Decreased range of motion;Decreased activity tolerance;Decreased balance;Decreased mobility;Decreased knowledge of use of DME;Pain;Decreased knowledge of precautions PT Treatment Interventions: DME instruction;Gait training;Stair training;Functional mobility training;Therapeutic activities;Therapeutic exercise;Balance training;Patient/family education     PT Goals(Current goals can be found in the care plan section) Acute Rehab PT Goals Patient Stated Goal: To go home today. PT Goal Formulation: With patient Time For Goal Achievement: 07/20/13 Potential to Achieve Goals: Good  Visit Information  Last PT Received On: 07/13/13 Assistance Needed: +1 History of Present Illness: s/p left anterior lateral THA       Prior Functioning  Home Living Family/patient expects to be discharged to:: Private residence Living Arrangements: Spouse/significant other;Children Available Help at Discharge: Family Type of Home: House Home Access: Stairs to enter Secretary/administrator of Steps: 1 Home Layout: One level Home Equipment: Environmental consultant - 2 wheels Prior Function Level of Independence: Independent Communication Communication: No difficulties Dominant Hand: Right    Cognition  Cognition Arousal/Alertness: Awake/alert Behavior During Therapy: WFL for tasks assessed/performed Overall Cognitive Status: Within Functional Limits for tasks assessed    Extremity/Trunk Assessment Lower Extremity Assessment Lower Extremity Assessment: LLE deficits/detail LLE Deficits / Details: Limited due to pain and unable to fully assess due to surgery LLE: Unable to fully assess due to pain   Balance    End of Session PT - End of Session Equipment Utilized During Treatment: Gait  belt Activity Tolerance: Patient tolerated treatment well Patient left: in bed;with call bell/phone within reach Nurse Communication: Mobility status  GP     Salil Raineri 07/13/2013, 8:41 AM  Jake Shark, PT  DPT [phone]

## 2013-07-13 NOTE — Progress Notes (Signed)
SPORTS MEDICINE AND JOINT REPLACEMENT  Georgena Spurling, MD   Altamese Cabal, PA-C [address] [place], [place], [place]  [address]                             [phone]   PROGRESS NOTE  Subjective:  negative for Chest Pain  negative for Shortness of Breath  negative for Nausea/Vomiting   negative for Calf Pain  negative for Bowel Movement   Tolerating Diet: yes         Patient reports pain as 4 on 0-10 scale.    Objective: Vital signs in last 24 hours:   Patient Vitals for the past 24 hrs:  BP Temp Temp src Pulse Resp SpO2  07/13/13 0525 106/66 mmHg 98.6 F (37 C) Oral 77 19 97 %  07/13/13 0200 105/61 mmHg 98.6 F (37 C) Oral 78 19 96 %  07/12/13 2013 109/69 mmHg 98.4 F (36.9 C) Oral 91 18 97 %  07/12/13 2000 - - - - 18 95 %  07/12/13 1500 116/67 mmHg 97.8 F (36.6 C) - 73 16 96 %  07/12/13 1440 - 97.2 F (36.2 C) - 75 17 94 %  07/12/13 1435 117/63 mmHg - - 74 11 96 %  07/12/13 1430 - - - 73 14 94 %  07/12/13 1425 - - - 72 12 96 %  07/12/13 1419 102/60 mmHg - - 77 14 97 %  07/12/13 1415 - - - 70 17 93 %  07/12/13 1410 - - - 81 18 93 %  07/12/13 1405 91/49 mmHg - - 75 15 96 %  07/12/13 1400 - - - 72 13 97 %  07/12/13 1349 122/63 mmHg - - - 12 96 %  07/12/13 1345 - - - 74 16 94 %  07/12/13 1334 - - - 66 19 97 %  07/12/13 1330 113/74 mmHg - - 65 15 94 %  07/12/13 1319 - - - 64 16 100 %  07/12/13 1315 127/72 mmHg - - 108 18 100 %  07/12/13 1300 130/68 mmHg 97 F (36.1 C) - 79 17 98 %  07/12/13 0838 128/77 mmHg 98.3 F (36.8 C) Oral 69 20 96 %    @flow {1959:LAST@   Intake/Output from previous day:   09/15 0701 - 09/16 0700 In: 1600 [P.O.:600; I.V.:1000] Out: 550 [Urine:550]   Intake/Output this shift:       Intake/Output     09/15 0701 - 09/16 0700 09/16 0701 - 09/17 0700   P.O. 600    I.V. 1000    Total Intake 1600     Urine 550    Total Output 550     Net +1050          Urine Occurrence 1 x       LABORATORY DATA:  Recent Labs  07/07/13 1305  07/12/13 1819 07/13/13 0445  WBC 7.1 11.9* 8.2  HGB 15.0 13.2 11.7*  HCT 46.3 40.3 36.4*  PLT 219 183 171    Recent Labs  07/07/13 1305 07/12/13 1819 07/13/13 0445  NA 137  --  136  K 4.9  --  5.0  CL 97  --  99  CO2 29  --  30  BUN 12  --  12  CREATININE 0.86 0.80 0.82  GLUCOSE 95  --  99  CALCIUM 9.6  --  7.9*   Lab Results  Component Value Date   INR 0.95 07/07/2013  Examination:  General appearance: alert, cooperative and no distress Extremities: Homans sign is negative, no sign of DVT  Wound Exam: clean, dry, intact   Drainage:  Scant/small amount Serosanguinous exudate  Motor Exam: EHL and FHL Intact  Sensory Exam: Deep Peroneal normal   Assessment:    1 Day Post-Op  Procedure(s) (LRB): TOTAL HIP ARTHROPLASTY (Left)  ADDITIONAL DIAGNOSIS:  Active Problems:   * No active hospital problems. *  Acute Blood Loss Anemia   Plan: Physical Therapy as ordered Weight Bearing as Tolerated (WBAT)  DVT Prophylaxis:  Lovenox  DISCHARGE PLAN: Home  DISCHARGE NEEDS: HHPT, CPM, Walker and 3-in-1 comode seat         Cele Mote 07/13/2013, 8:05 AM

## 2013-07-13 NOTE — Plan of Care (Signed)
Problem: Consults Goal: Diagnosis- Total Joint Replacement Primary Total Hip Left     

## 2013-07-13 NOTE — Progress Notes (Signed)
07/13/13 Patient set up for HHPT with Gordy Clement by MD office. Spoke with patient, no change in d/c plan. Patient states that he already has rolling walker and 3N1, no equipment needs identified. Jacquelynn Cree RN,BSN, CCM

## 2013-07-13 NOTE — Progress Notes (Signed)
Physical Therapy Treatment Patient Details Name: Thomas Palmer. MRN: [id number] DOB: 1941/09/30 Today's Date: 07/13/2013 Time: 1137-1200 PT Time Calculation (min): 23 min  PT Assessment / Plan / Recommendation  History of Present Illness s/p left anterior lateral THA   PT Comments   Pt ready to eat when entering room however wanting to work with therapy prior to eating.  Pt able to ambulate and perform stair negotiation this session.  Wife present and educated pt's wife on HEP handout.    Follow Up Recommendations  Home health PT     Equipment Recommendations  None recommended by PT    Frequency 7X/week   Progress towards PT Goals Progress towards PT goals: Progressing toward goals  Plan Current plan remains appropriate    Precautions / Restrictions Precautions Precautions: Anterior Hip;Fall Precaution Booklet Issued: Yes (comment) Precaution Comments: Anterior handout given and reviewed with wife Restrictions Weight Bearing Restrictions: Yes LLE Weight Bearing: Weight bearing as tolerated   Pertinent Vitals/Pain Mild c/o pain does not rate    Mobility  Bed Mobility Bed Mobility: Sit to Supine Sit to Supine: 4: Min guard Details for Bed Mobility Assistance: Minguard for safety Transfers Transfers: Sit to Stand;Stand to Sit Sit to Stand: 4: Min guard;From chair/3-in-1;From bed Stand to Sit: 4: Min guard;To chair/3-in-1 Details for Transfer Assistance: Minguard for safety with cues for hand placement Ambulation/Gait Ambulation/Gait Assistance: 4: Min guard;5: Supervision Ambulation Distance (Feet): 125 Feet Assistive device: Rolling walker Ambulation/Gait Assistance Details: Minguard for safety with cues for turns. Step to per MD orders Gait Pattern: Step-to pattern;Decreased stride length;Antalgic Gait velocity: decreased due to step to gait Stairs: Yes Stairs Assistance: 4: Min guard Stair Management Technique: Backwards;No rails;With walker Number of  Stairs: 1    Exercises     PT Diagnosis:    PT Problem List:   PT Treatment Interventions:     PT Goals (current goals can now be found in the care plan section) Acute Rehab PT Goals Patient Stated Goal: To go home today. PT Goal Formulation: With patient Time For Goal Achievement: 07/20/13 Potential to Achieve Goals: Good  Visit Information  Last PT Received On: 07/13/13 Assistance Needed: +1 History of Present Illness: s/p left anterior lateral THA    Subjective Data  Subjective: I'm hungry and ready to eat. Patient Stated Goal: To go home today.   Cognition  Cognition Arousal/Alertness: Awake/alert Behavior During Therapy: WFL for tasks assessed/performed (slightly impuslive at times) Overall Cognitive Status: Within Functional Limits for tasks assessed    Balance     End of Session PT - End of Session Equipment Utilized During Treatment: Gait belt Activity Tolerance: Patient tolerated treatment well Patient left: in chair;with call bell/phone within reach Nurse Communication: Mobility status   GP     [name] 07/13/2013, 12:04 PM   Jake Shark, PT DPT [phone]

## 2013-07-13 NOTE — Evaluation (Signed)
Occupational Therapy Evaluation Patient Details Name: Thomas Palmer. MRN: [id number] DOB: November 12, 1940 Today's Date: 07/13/2013 Time: 8295-6213 OT Time Calculation (min): 16 min  OT Assessment / Plan / Recommendation History of present illness s/p left anterior lateral THA   Clinical Impression   Pt doing well and requires mod A with LB ADLs and min guard A with ADL mobility. Pt will have 24 hour assist/sup at home. All education completed and no further acute OT indicated at this time. Pt's wife present during session and there were provided with education and demo of ADL A/E use for home    OT Assessment  Patient does not need any further OT services    Follow Up Recommendations  No OT follow up    Barriers to Discharge      Equipment Recommendations  3 in 1 bedside comode;Other (comment) (ADL A/E kit)    Recommendations for Other Services    Frequency       Precautions / Restrictions Precautions Precautions: Anterior Hip;Fall Precaution Booklet Issued: Yes (comment) Precaution Comments: Avoid hyperextension of hip, avoid external rotation, avoid abduction Restrictions Weight Bearing Restrictions: Yes LLE Weight Bearing: Weight bearing as tolerated   Pertinent Vitals/Pain 4/10    ADL  Grooming: Performed;Wash/dry hands;Wash/dry face;Set up Where Assessed - Grooming: Unsupported sitting Upper Body Bathing: Simulated;Supervision/safety;Set up Lower Body Bathing: Simulated;Moderate assistance Upper Body Dressing: Performed;Supervision/safety;Set up Lower Body Dressing: Performed;Moderate assistance Toilet Transfer: Simulated;Min Pension scheme manager Method: Sit to stand Toileting - Clothing Manipulation and Hygiene: Minimal assistance Where Assessed - Toileting Clothing Manipulation and Hygiene: Standing Transfers/Ambulation Related to ADLs: pt declined transfer in bathroom ADL Comments: pt and wife provided with education and demo of ADL A/E    OT Diagnosis:     OT Problem List:   OT Treatment Interventions:     OT Goals(Current goals can be found in the care plan section) Acute Rehab OT Goals Patient Stated Goal: To go home today.  Visit Information  Last OT Received On: 07/13/13 Assistance Needed: +1 History of Present Illness: s/p left anterior lateral THA       Prior Functioning     Home Living Family/patient expects to be discharged to:: Private residence Living Arrangements: Spouse/significant other;Children Available Help at Discharge: Family Type of Home: House Home Access: Stairs to enter Secretary/administrator of Steps: 1 Home Layout: One level Home Equipment: Environmental consultant - 2 wheels Prior Function Level of Independence: Independent Communication Communication: No difficulties Dominant Hand: Right         Vision/Perception Vision - History Baseline Vision: Wears glasses all the time Patient Visual Report: No change from baseline Perception Perception: Within Functional Limits   Cognition  Cognition Arousal/Alertness: Awake/alert Behavior During Therapy: WFL for tasks assessed/performed Overall Cognitive Status: Within Functional Limits for tasks assessed    Extremity/Trunk Assessment Upper Extremity Assessment Upper Extremity Assessment: Overall WFL for tasks assessed     Mobility Bed Mobility Bed Mobility: Supine to Sit;Sitting - Scoot to [place] of Bed;Sit to Supine;Scooting to HOB Supine to Sit: 4: Min guard Sitting - Scoot to Edge of Bed: 4: Min guard Sit to Supine: 4: Min guard Scooting to [facility]: With rail;4: Min guard Details for Bed Mobility Assistance: Minguard for safety Transfers Sit to Stand: 4: Min guard;From chair/3-in-1;From bed Stand to Sit: 4: Min guard;To chair/3-in-1 Details for Transfer Assistance: pt declined transfers, wants to wait for PT to return for more walking     Exercise     Balance Balance Balance Assessed:  No   End of Session OT - End of Session Activity Tolerance:  Patient tolerated treatment well Patient left: in bed;with call bell/phone within reach;with family/visitor present  GO     Galen Manila 07/13/2013, 12:59 PM

## 2013-07-19 NOTE — Discharge Summary (Signed)
SPORTS MEDICINE & JOINT REPLACEMENT   Georgena Spurling, MD   Altamese Cabal, PA-C [address] [place], [place], [place]  [address]                             [phone]  PATIENT ID: Thomas Palmer.        MRN:  [id number]          DOB/AGE: 1941/03/17 / 72 y.o.    DISCHARGE SUMMARY  ADMISSION DATE:    07/12/2013 DISCHARGE DATE:  07/13/2013  ADMISSION DIAGNOSIS: osteoarthritis left hip    DISCHARGE DIAGNOSIS:  osteoarthritis left hip    ADDITIONAL DIAGNOSIS: Active Problems:   * No active hospital problems. *  Past Medical History  Diagnosis Date  . Hypothyroidism   . Depression   . H/O hiatal hernia   . Pneumonia 08/2011  . Arthritis     "joints in hips and fingers; shoulders too" (07/13/2013)  . Chronic lower back pain     PROCEDURE: Procedure(s): TOTAL HIP ARTHROPLASTY on 07/12/2013  CONSULTS:     HISTORY:  See H&P in chart  HOSPITAL COURSE:  Douglass [place]. is a 72 y.o. admitted on 07/12/2013 and found to have a diagnosis of osteoarthritis left hip.  After appropriate laboratory studies were obtained  they were taken to the operating room on 07/12/2013 and underwent Procedure(s): TOTAL HIP ARTHROPLASTY.   They were given perioperative antibiotics:  Anti-infectives   Start     Dose/Rate Route Frequency Ordered Stop   07/12/13 1700  ceFAZolin (ANCEF) IVPB 2 g/50 mL premix     2 g 100 mL/hr over 30 Minutes Intravenous Every 6 hours 07/12/13 1518 07/12/13 2251   07/12/13 0600  ceFAZolin (ANCEF) IVPB 2 g/50 mL premix     2 g 100 mL/hr over 30 Minutes Intravenous On call to O.R. 07/11/13 1407 07/12/13 1042    .  Tolerated the procedure well.  Placed with a foley intraoperatively.  Given Ofirmev at induction and for 48 hours.    POD# 1: Vital signs were stable.  Patient denied Chest pain, shortness of breath, or calf pain.  Patient was started on Lovenox 30 mg subcutaneously twice daily at 8am.  Consults to PT, OT, and care management were made.  The  patient was weight bearing as tolerated.  CPM was placed on the operative leg 0-90 degrees for 6-8 hours a day.  Incentive spirometry was taught.  Dressing was changed.  Marcaine pump and hemovac were discontinued.      POD #2, Continued  PT for ambulation and exercise program.  IV saline locked.  O2 discontinued.    The remainder of the hospital course was dedicated to ambulation and strengthening.   The patient was discharged on 1 day post op in  Good condition.  Blood products given:none  DIAGNOSTIC STUDIES: Recent vital signs: No data found.      Recent laboratory studies:  Recent Labs  07/12/13 1819 07/13/13 0445  WBC 11.9* 8.2  HGB 13.2 11.7*  HCT 40.3 36.4*  PLT 183 171    Recent Labs  07/12/13 1819 07/13/13 0445  NA  --  136  K  --  5.0  CL  --  99  CO2  --  30  BUN  --  12  CREATININE 0.80 0.82  GLUCOSE  --  99  CALCIUM  --  7.9*   Lab Results  Component Value Date  INR 0.95 07/07/2013     Recent Radiographic Studies :  Dg Chest 2 View  07/07/2013   CLINICAL DATA:  72 year old male preoperative study for hip surgery. Hiatal hernia.  EXAM: CHEST  2 VIEW  COMPARISON:  None.  FINDINGS: Moderate size gastric hiatal hernia suspected. Other mediastinal contours are within normal limits. Previous lumbar spinal compression fracture status post augmentation. Somewhat large lung volumes. No pneumothorax, pulmonary edema, pleural effusion or confluent pulmonary opacity. Flowing osteophytes worsened asthma fight in the thoracic spine.  IMPRESSION: No acute cardiopulmonary abnormality. Suspect hyperinflation. Moderate size hiatal hernia.   Electronically Signed   By: Augusto Gamble M.D.   On: 07/07/2013 13:52   Dg Pelvis Portable  07/12/2013   CLINICAL DATA:  Postop left hip.  EXAM: PORTABLE PELVIS  COMPARISON:  None  FINDINGS: Changes of left hip replacement. Normal AP alignment. No bony complicating feature. Soft tissue gas noted. Remote changes of right hip replacement.   IMPRESSION: Left hip replacement without visible complicating feature.   Electronically Signed   By: Charlett Nose M.D.   On: 07/12/2013 15:24   Dg Hip Portable 1 View Left  07/12/2013   *RADIOLOGY REPORT*  Clinical Data: Left hip replacement.  PORTABLE LEFT HIP - 1 VIEW  Comparison: None.  Findings: Cross-table lateral view of the left hip demonstrates a total arthroplasty in place.  Cerclage wire about the proximal femur is noted.  There is gas in the soft tissues from surgery and surgical staples are noted.  The device is located and no fracture is identified.  IMPRESSION: Left total hip replacement without acute abnormality.   Original Report Authenticated By: Holley Dexter, M.D.    DISCHARGE INSTRUCTIONS: Discharge Orders   Future Orders Complete By Expires   Call MD / Call 911  As directed    Comments:     If you experience chest pain or shortness of breath, CALL 911 and be transported to the hospital emergency room.  If you develope a fever above 101 F, pus (white drainage) or increased drainage or redness at the wound, or calf pain, call your surgeon's office.   Change dressing  As directed    Comments:     You may change your dressing on wednesday, then change the dressing daily with sterile 4 x 4 inch gauze dressing and paper tape.   Constipation Prevention  As directed    Comments:     Drink plenty of fluids.  Prune juice may be helpful.  You may use a stool softener, such as Colace (over the counter) 100 mg twice a day.  Use MiraLax (over the counter) for constipation as needed.   Diet - low sodium heart healthy  As directed    Driving restrictions  As directed    Comments:     No driving for 6 weeks   Follow the hip precautions as taught in Physical Therapy  As directed    Increase activity slowly as tolerated  As directed    Lifting restrictions  As directed    Comments:     No lifting for 6 weeks   TED hose  As directed    Comments:     Use stockings (TED hose) for 3  both weeks on both  leg(s).  You may remove them at night for sleeping.      DISCHARGE MEDICATIONS:     Medication List    STOP taking these medications       aspirin EC 81 MG  tablet      TAKE these medications       enoxaparin 40 MG/0.4ML injection  Commonly known as:  LOVENOX  Inject 0.4 mLs (40 mg total) into the skin daily.     ferrous sulfate 324 (65 FE) MG Tbec  Take 2 tablets by mouth daily.     FLUoxetine 20 MG capsule  Commonly known as:  PROZAC  Take 20 mg by mouth daily.     levothyroxine 137 MCG tablet  Commonly known as:  SYNTHROID, LEVOTHROID  Take 137 mcg by mouth daily before breakfast.     Lutein 20 MG Caps  Take 1 capsule by mouth daily.     methocarbamol 500 MG tablet  Commonly known as:  ROBAXIN  Take 1-2 tablets (500-1,000 mg total) by mouth every 6 (six) hours as needed.     multivitamin with minerals tablet  Take 1 tablet by mouth daily.     oxyCODONE 5 MG immediate release tablet  Commonly known as:  Oxy IR/ROXICODONE  Take 1-2 tablets (5-10 mg total) by mouth every 4 (four) hours as needed.     OxyCODONE 10 mg T12a 12 hr tablet  Commonly known as:  OXYCONTIN  Take 1 tablet (10 mg total) by mouth every 12 (twelve) hours.     Testosterone 12.5 MG/ACT (1%) Gel  Place 1 packet onto the skin daily.     vitamin C 1000 MG tablet  Take 1,000 mg by mouth daily.        FOLLOW UP VISIT:       Follow-up Information   Follow up with Raymon Mutton, MD. Call on 07/27/2013.   Specialty:  Orthopedic Surgery   Contact information:   [address] [place] [address] [phone]       Please follow up. [facility] [phone] home physical therapy, they will contact you)       DISPOSITION: HOME  CONDITION:  Good   Beckem Tomberlin 07/19/2013, 7:15 AM

## 2013-07-27 DIAGNOSIS — Z96649 Presence of unspecified artificial hip joint: Secondary | ICD-10-CM | POA: Insufficient documentation

## 2013-11-23 ENCOUNTER — Ambulatory Visit: Payer: Self-pay | Admitting: Gastroenterology

## 2013-11-30 ENCOUNTER — Ambulatory Visit: Payer: Self-pay | Admitting: Gastroenterology

## 2013-12-02 ENCOUNTER — Ambulatory Visit: Payer: Self-pay | Admitting: Gastroenterology

## 2013-12-30 ENCOUNTER — Ambulatory Visit: Payer: Self-pay | Admitting: Surgery

## 2013-12-30 LAB — CBC
HCT: 41.8 % (ref 40.0–52.0)
HGB: 13.7 g/dL (ref 13.0–18.0)
MCH: 28.1 pg (ref 26.0–34.0)
MCHC: 32.9 g/dL (ref 32.0–36.0)
MCV: 86 fL (ref 80–100)
PLATELETS: 185 10*3/uL (ref 150–440)
RBC: 4.88 10*6/uL (ref 4.40–5.90)
RDW: 13.9 % (ref 11.5–14.5)
WBC: 6.5 10*3/uL (ref 3.8–10.6)

## 2014-01-07 ENCOUNTER — Ambulatory Visit: Payer: Self-pay | Admitting: Surgery

## 2014-01-08 LAB — CBC WITH DIFFERENTIAL/PLATELET
Basophil #: 0 10*3/uL (ref 0.0–0.1)
Basophil %: 0.3 %
EOS ABS: 0.1 10*3/uL (ref 0.0–0.7)
EOS PCT: 1.4 %
HCT: 39.3 % — AB (ref 40.0–52.0)
HGB: 12.6 g/dL — ABNORMAL LOW (ref 13.0–18.0)
Lymphocyte #: 0.6 10*3/uL — ABNORMAL LOW (ref 1.0–3.6)
Lymphocyte %: 6.4 %
MCH: 27.6 pg (ref 26.0–34.0)
MCHC: 32.2 g/dL (ref 32.0–36.0)
MCV: 86 fL (ref 80–100)
Monocyte #: 0.9 x10 3/mm (ref 0.2–1.0)
Monocyte %: 10.4 %
NEUTROS ABS: 7.4 10*3/uL — AB (ref 1.4–6.5)
Neutrophil %: 81.5 %
PLATELETS: 155 10*3/uL (ref 150–440)
RBC: 4.58 10*6/uL (ref 4.40–5.90)
RDW: 14.1 % (ref 11.5–14.5)
WBC: 9.1 10*3/uL (ref 3.8–10.6)

## 2014-07-07 DIAGNOSIS — K259 Gastric ulcer, unspecified as acute or chronic, without hemorrhage or perforation: Secondary | ICD-10-CM | POA: Insufficient documentation

## 2014-07-07 DIAGNOSIS — D509 Iron deficiency anemia, unspecified: Secondary | ICD-10-CM | POA: Insufficient documentation

## 2014-09-01 ENCOUNTER — Other Ambulatory Visit: Payer: Self-pay | Admitting: Neurosurgery

## 2014-09-01 DIAGNOSIS — S32000S Wedge compression fracture of unspecified lumbar vertebra, sequela: Secondary | ICD-10-CM

## 2014-09-05 ENCOUNTER — Ambulatory Visit
Admission: RE | Admit: 2014-09-05 | Discharge: 2014-09-05 | Disposition: A | Discharge status: Home / Self Care | Payer: Commercial Managed Care - HMO | Source: Ambulatory Visit | Attending: Neurosurgery | Admitting: Neurosurgery

## 2014-09-05 DIAGNOSIS — S32000S Wedge compression fracture of unspecified lumbar vertebra, sequela: Secondary | ICD-10-CM

## 2014-11-29 DIAGNOSIS — M545 Low back pain: Secondary | ICD-10-CM | POA: Diagnosis not present

## 2014-11-29 DIAGNOSIS — Z6829 Body mass index (BMI) 29.0-29.9, adult: Secondary | ICD-10-CM | POA: Diagnosis not present

## 2014-11-29 DIAGNOSIS — S32000S Wedge compression fracture of unspecified lumbar vertebra, sequela: Secondary | ICD-10-CM | POA: Diagnosis not present

## 2015-01-02 DIAGNOSIS — M545 Low back pain: Secondary | ICD-10-CM | POA: Diagnosis not present

## 2015-01-02 DIAGNOSIS — M47816 Spondylosis without myelopathy or radiculopathy, lumbar region: Secondary | ICD-10-CM | POA: Diagnosis not present

## 2015-01-17 DIAGNOSIS — M545 Low back pain: Secondary | ICD-10-CM | POA: Diagnosis not present

## 2015-01-17 DIAGNOSIS — M47816 Spondylosis without myelopathy or radiculopathy, lumbar region: Secondary | ICD-10-CM | POA: Diagnosis not present

## 2015-02-02 DIAGNOSIS — M545 Low back pain: Secondary | ICD-10-CM | POA: Diagnosis not present

## 2015-02-02 DIAGNOSIS — M47816 Spondylosis without myelopathy or radiculopathy, lumbar region: Secondary | ICD-10-CM | POA: Diagnosis not present

## 2015-02-16 DIAGNOSIS — Z0100 Encounter for examination of eyes and vision without abnormal findings: Secondary | ICD-10-CM | POA: Diagnosis not present

## 2015-02-16 DIAGNOSIS — M545 Low back pain: Secondary | ICD-10-CM | POA: Diagnosis not present

## 2015-02-16 DIAGNOSIS — M47816 Spondylosis without myelopathy or radiculopathy, lumbar region: Secondary | ICD-10-CM | POA: Diagnosis not present

## 2015-02-18 NOTE — Discharge Summary (Signed)
PATIENT NAME:  Thomas Palmer, Thomas Palmer MR#:  [id number] DATE OF BIRTH:  1941-01-06  DATE OF ADMISSION:  01/07/2014 DATE OF DISCHARGE:  01/10/2014  HISTORY OF PRESENT ILLNESS: This 74 year old male was admitted with a history of epigastric pain and endoscopy findings of hiatus hernia. He had pain, particularly with bending over. Had recent findings of gastric mucosal erosions and taken pantoprazole. It appeared that his symptoms were related to the hiatus hernia, and repair was recommended for definitive treatment.   Details of the past medical history, physical findings and medicines are recorded on the typed H and [place]: The patient came in through the outpatient surgery department and was carried to the operating room, where he had laparoscopy. However, during the course of the procedure, it was found that there was a large amount of fat in the operative area, and it was necessary to convert to an open procedure. His hiatus hernia was repaired.   Postoperatively, he was treated with IV fluids and kept in the hospital for a period of observation. He later was able to begin a liquid diet and advance to full liquids, which he tolerated well. He had minimal postoperative pain and was in satisfactory condition on 01/10/2014 for discharge.   FINAL DIAGNOSIS: Hiatus hernia.   OPERATION: Laparoscopy, open hiatus hernia repair.   DISCHARGE INSTRUCTIONS: Wound care instructions given, and plans made for followup in the office.   ____________________________ J. Rochel Brome, MD jws:lb D: 01/25/2014 11:24:42 ET T: 01/25/2014 12:00:15 ET JOB#: [id number]  cc: Loreli Dollar, MD, <Dictator> Loreli Dollar MD ELECTRONICALLY SIGNED 01/27/2014 9:25

## 2015-02-18 NOTE — Op Note (Signed)
PATIENT NAME:  Thomas Palmer, Thomas Palmer MR#:  [id number] DATE OF BIRTH:  [birth year]  DATE OF PROCEDURE:  01/07/2014  PREOPERATIVE DIAGNOSIS: Paraesophageal hiatus hernia.   POSTOPERATIVE DIAGNOSIS: Paraesophageal hiatus hernia.   PROCEDURE PERFORMED: Laparoscopy followed by open hiatus hernia repair.   SURGEON: Loreli Dollar, MD  ASSISTANT: Lew Dawes. Oaks, MD  ANESTHESIA: General.   INDICATION: This 74 year old male has a history of bleeding erosions in the stomach and findings of hiatus hernia and symptoms of discomfort in the lower sternal area with activities such as bending over, which has been limiting his normal activities. Upper GI x-ray demonstrated paraesophageal hiatus hernia. Repair was recommended for definitive treatment.   DESCRIPTION OF PROCEDURE: The patient was placed on the operating table in the supine position under general endotracheal anesthesia. The abdomen was clipped and prepared with ChloraPrep and draped in a sterile manner.   A short incision was made above the umbilicus and carried down to the deep fascia, which was grasped with laryngeal hook and elevated. A Veress needle was inserted, aspirated, and irrigated with a saline solution. Next, the peritoneal cavity was inflated with carbon dioxide. The Veress needle was removed. The 10 mm cannula was inserted. The 10 mm 30 degree laparoscope was inserted to view the peritoneal cavity. The liver appeared normal. Brief survey of the visible colon and small bowel appeared typical. Next, another incision was made in the subxiphoid position to insert an 11 mm cannula, and another incision was made in the left upper quadrant at the costal margin at the anterior axillary line to insert an 11 mm cannula. Another incision was made in the right upper quadrant at the costal margin at the midclavicular line to insert an 11 mm cannula. Another incision was made midway between that site and the port site for the camera to insert a 12 mm  cannula, and then ultimately a sixth incision in the anterior axillary line at the right upper quadrant to introduce an 11 mm cannula for a total of 6 ports.   With the patient in the reverse Trendelenburg position, the fan retractor was introduced through the subxiphoid port and held in place with the Bookwalter mechanical arm to retract the left lobe of the liver, exposing the diaphragmatic hiatus. Next, traction was applied to the stomach and it was demonstrated there was a large hiatus hernia and a large portion of the stomach, probably 50%, was in the chest, and traction was applied as it was delivered down towards the abdomen.   There was a finding of a large amount of fatty tissue in the region of the hiatus, making exposure difficult. The dissection was begun with an incision of the gastrohepatic ligament using Harmonic scalpel for hemostasis, and an incision was made in the sac extending anteriorly from left to right. Next, with further dissection, the right crus of the diaphragm was identified. However, there was a lot of fatty tissue requiring traction and frequent repositioning of traction, which was primarily done with Babcock clamps and grasper. Further dissection was carried out along the right crus of the diaphragm and dissected hernia sac posteriorly. Visibility was difficult with large amount of fatty tissue and it appeared that progress was very slow, and subsequently there was some bleeding, appeared to be primarily venous bleeding, and at this point after suctioning and some persistent bleeding elected to convert to an open procedure. Therefore, the laparoscopic instruments were removed. An upper abdominal midline incision was made from the xiphoid process to the umbilicus,  incorporating the two midline incisions into this incision, and the peritoneal cavity was opened. Next, the [facility] retractor was used to retract the upper abdominal wall, and also the hepatophrenic ligament was incised  on the patient's left side, and the malleable retractor was placed over a moist lap pack to retract the left lobe of the liver, further exposing the diaphragmatic hiatus. Some clotted blood was suctioned, and several small bleeding points were cauterized with the Harmonic scalpel. It again appeared that exposure was difficult. I did use headlight and also called and asked Dr. Nestor Lewandowsky to come, and he helped with assistance and exposure and technique. Further dissection was carried out with somewhat tedious dissection, dissecting the sac using the Harmonic scalpel and with time, the left crus of the diaphragm was identified. Additional stomach was mobilized posteriorly, and subsequently with appropriate retraction with malleable retractors and moist lap packs, the left and right crura were further demonstrated, and also we could see that the stomach had been fully reduced into the peritoneal cavity. Next, repair was carried out with a row of 0 Surgilon sutures, suturing the left crus of the diaphragm to the right crus, and this was carried up until satisfactory narrowing of the hiatus around the esophagus, and at this point it appeared that hemostasis was subsequently intact. The repair looked good. It appeared that there was some satisfactory tissue for holding sutures. The operative field was inspected. All lap packs were removed. Count was correct. Next, with all instruments removed, the closure of the midline incision was carried out with 0 Maxon figure-of-eight sutures in the midline fascia, and all skin incisions were closed with clips. Dressings were applied with paper tape. The patient appeared to be in satisfactory condition and was prepared for transfer to the recovery room.    ____________________________ Lenna Sciara. Rochel Brome, MD jws:jcm D: 01/07/2014 13:37:40 ET T: 01/07/2014 16:53:17 ET JOB#: [id number]  cc: Loreli Dollar, MD, <Dictator> Loreli Dollar MD ELECTRONICALLY SIGNED 01/12/2014 17:47

## 2015-03-02 DIAGNOSIS — M545 Low back pain: Secondary | ICD-10-CM | POA: Diagnosis not present

## 2015-03-02 DIAGNOSIS — M47816 Spondylosis without myelopathy or radiculopathy, lumbar region: Secondary | ICD-10-CM | POA: Diagnosis not present

## 2015-03-03 DIAGNOSIS — Z0100 Encounter for examination of eyes and vision without abnormal findings: Secondary | ICD-10-CM | POA: Diagnosis not present

## 2015-03-20 DIAGNOSIS — M545 Low back pain: Secondary | ICD-10-CM | POA: Diagnosis not present

## 2015-03-29 DIAGNOSIS — R1030 Lower abdominal pain, unspecified: Secondary | ICD-10-CM | POA: Diagnosis not present

## 2015-03-29 DIAGNOSIS — E119 Type 2 diabetes mellitus without complications: Secondary | ICD-10-CM | POA: Diagnosis not present

## 2015-03-29 DIAGNOSIS — K219 Gastro-esophageal reflux disease without esophagitis: Secondary | ICD-10-CM | POA: Diagnosis not present

## 2015-04-04 DIAGNOSIS — R1030 Lower abdominal pain, unspecified: Secondary | ICD-10-CM | POA: Diagnosis not present

## 2015-04-06 DIAGNOSIS — E039 Hypothyroidism, unspecified: Secondary | ICD-10-CM | POA: Diagnosis not present

## 2015-04-06 DIAGNOSIS — E559 Vitamin D deficiency, unspecified: Secondary | ICD-10-CM | POA: Diagnosis not present

## 2015-04-06 DIAGNOSIS — R7309 Other abnormal glucose: Secondary | ICD-10-CM | POA: Diagnosis not present

## 2015-04-06 DIAGNOSIS — M545 Low back pain: Secondary | ICD-10-CM | POA: Diagnosis not present

## 2015-04-13 DIAGNOSIS — K5732 Diverticulitis of large intestine without perforation or abscess without bleeding: Secondary | ICD-10-CM | POA: Diagnosis not present

## 2015-04-13 DIAGNOSIS — F328 Other depressive episodes: Secondary | ICD-10-CM | POA: Diagnosis not present

## 2015-04-13 DIAGNOSIS — E039 Hypothyroidism, unspecified: Secondary | ICD-10-CM | POA: Diagnosis not present

## 2015-04-13 DIAGNOSIS — M545 Low back pain: Secondary | ICD-10-CM | POA: Diagnosis not present

## 2015-07-13 DIAGNOSIS — R109 Unspecified abdominal pain: Secondary | ICD-10-CM | POA: Diagnosis not present

## 2015-07-13 DIAGNOSIS — Z8719 Personal history of other diseases of the digestive system: Secondary | ICD-10-CM | POA: Diagnosis not present

## 2015-07-13 DIAGNOSIS — R197 Diarrhea, unspecified: Secondary | ICD-10-CM | POA: Diagnosis not present

## 2015-07-26 ENCOUNTER — Other Ambulatory Visit: Payer: Self-pay | Admitting: Gastroenterology

## 2015-07-26 DIAGNOSIS — R1084 Generalized abdominal pain: Secondary | ICD-10-CM

## 2015-07-31 ENCOUNTER — Ambulatory Visit
Admission: RE | Admit: 2015-07-31 | Discharge: 2015-07-31 | Disposition: A | Discharge status: Home / Self Care | Payer: Commercial Managed Care - HMO | Source: Ambulatory Visit | Attending: Gastroenterology | Admitting: Gastroenterology

## 2015-07-31 DIAGNOSIS — R634 Abnormal weight loss: Secondary | ICD-10-CM | POA: Diagnosis not present

## 2015-07-31 DIAGNOSIS — R10819 Abdominal tenderness, unspecified site: Secondary | ICD-10-CM | POA: Diagnosis not present

## 2015-07-31 DIAGNOSIS — K5730 Diverticulosis of large intestine without perforation or abscess without bleeding: Secondary | ICD-10-CM | POA: Insufficient documentation

## 2015-07-31 DIAGNOSIS — R197 Diarrhea, unspecified: Secondary | ICD-10-CM | POA: Diagnosis not present

## 2015-07-31 DIAGNOSIS — R1084 Generalized abdominal pain: Secondary | ICD-10-CM | POA: Insufficient documentation

## 2015-07-31 LAB — POCT I-STAT CREATININE: Creatinine, Ser: 0.9 mg/dL (ref 0.61–1.24)

## 2015-07-31 MED ORDER — IOHEXOL 300 MG/ML  SOLN
100.0000 mL | Freq: Once | INTRAMUSCULAR | Status: AC | PRN
  Administered 2015-07-31: 100 mL via INTRAVENOUS

## 2015-08-04 DIAGNOSIS — Z23 Encounter for immunization: Secondary | ICD-10-CM | POA: Diagnosis not present

## 2015-08-17 DIAGNOSIS — R197 Diarrhea, unspecified: Secondary | ICD-10-CM | POA: Diagnosis not present

## 2015-09-05 DIAGNOSIS — H524 Presbyopia: Secondary | ICD-10-CM | POA: Diagnosis not present

## 2015-09-05 DIAGNOSIS — H52223 Regular astigmatism, bilateral: Secondary | ICD-10-CM | POA: Diagnosis not present

## 2015-09-05 DIAGNOSIS — H5203 Hypermetropia, bilateral: Secondary | ICD-10-CM | POA: Diagnosis not present

## 2015-09-05 DIAGNOSIS — H2513 Age-related nuclear cataract, bilateral: Secondary | ICD-10-CM | POA: Diagnosis not present

## 2015-09-15 DIAGNOSIS — D2312 Other benign neoplasm of skin of left eyelid, including canthus: Secondary | ICD-10-CM | POA: Diagnosis not present

## 2015-09-15 DIAGNOSIS — D2310 Other benign neoplasm of skin of unspecified eyelid, including canthus: Secondary | ICD-10-CM | POA: Diagnosis not present

## 2015-09-15 DIAGNOSIS — H25813 Combined forms of age-related cataract, bilateral: Secondary | ICD-10-CM | POA: Diagnosis not present

## 2015-09-25 DIAGNOSIS — H25811 Combined forms of age-related cataract, right eye: Secondary | ICD-10-CM | POA: Diagnosis not present

## 2015-09-25 DIAGNOSIS — Z961 Presence of intraocular lens: Secondary | ICD-10-CM | POA: Diagnosis not present

## 2015-09-25 DIAGNOSIS — H2511 Age-related nuclear cataract, right eye: Secondary | ICD-10-CM | POA: Diagnosis not present

## 2015-10-26 DIAGNOSIS — Z125 Encounter for screening for malignant neoplasm of prostate: Secondary | ICD-10-CM | POA: Diagnosis not present

## 2015-10-26 DIAGNOSIS — F3289 Other specified depressive episodes: Secondary | ICD-10-CM | POA: Diagnosis not present

## 2015-10-26 DIAGNOSIS — E039 Hypothyroidism, unspecified: Secondary | ICD-10-CM | POA: Diagnosis not present

## 2015-10-26 DIAGNOSIS — M545 Low back pain: Secondary | ICD-10-CM | POA: Diagnosis not present

## 2015-10-26 DIAGNOSIS — G8929 Other chronic pain: Secondary | ICD-10-CM | POA: Diagnosis not present

## 2015-10-26 DIAGNOSIS — K5732 Diverticulitis of large intestine without perforation or abscess without bleeding: Secondary | ICD-10-CM | POA: Diagnosis not present

## 2015-11-02 DIAGNOSIS — M810 Age-related osteoporosis without current pathological fracture: Secondary | ICD-10-CM | POA: Diagnosis not present

## 2015-11-02 DIAGNOSIS — G8929 Other chronic pain: Secondary | ICD-10-CM | POA: Diagnosis not present

## 2015-11-02 DIAGNOSIS — M545 Low back pain: Secondary | ICD-10-CM

## 2015-11-02 DIAGNOSIS — Z136 Encounter for screening for cardiovascular disorders: Secondary | ICD-10-CM | POA: Diagnosis not present

## 2015-11-02 DIAGNOSIS — R7989 Other specified abnormal findings of blood chemistry: Secondary | ICD-10-CM | POA: Insufficient documentation

## 2015-11-02 DIAGNOSIS — K5730 Diverticulosis of large intestine without perforation or abscess without bleeding: Secondary | ICD-10-CM | POA: Diagnosis not present

## 2015-11-02 DIAGNOSIS — E291 Testicular hypofunction: Secondary | ICD-10-CM | POA: Diagnosis not present

## 2015-11-02 DIAGNOSIS — Z0000 Encounter for general adult medical examination without abnormal findings: Secondary | ICD-10-CM | POA: Diagnosis not present

## 2015-11-09 DIAGNOSIS — H2512 Age-related nuclear cataract, left eye: Secondary | ICD-10-CM | POA: Diagnosis not present

## 2015-11-09 DIAGNOSIS — H52223 Regular astigmatism, bilateral: Secondary | ICD-10-CM | POA: Diagnosis not present

## 2015-11-09 DIAGNOSIS — H25812 Combined forms of age-related cataract, left eye: Secondary | ICD-10-CM | POA: Diagnosis not present

## 2015-11-09 DIAGNOSIS — H5203 Hypermetropia, bilateral: Secondary | ICD-10-CM | POA: Diagnosis not present

## 2015-11-09 DIAGNOSIS — H2513 Age-related nuclear cataract, bilateral: Secondary | ICD-10-CM | POA: Diagnosis not present

## 2015-11-09 DIAGNOSIS — H524 Presbyopia: Secondary | ICD-10-CM | POA: Diagnosis not present

## 2015-11-21 ENCOUNTER — Other Ambulatory Visit: Payer: Self-pay | Admitting: Internal Medicine

## 2015-11-21 DIAGNOSIS — Z136 Encounter for screening for cardiovascular disorders: Secondary | ICD-10-CM

## 2015-11-24 ENCOUNTER — Ambulatory Visit: Admission: RE | Admit: 2015-11-24 | Payer: Commercial Managed Care - HMO | Source: Ambulatory Visit

## 2016-03-18 DIAGNOSIS — M5416 Radiculopathy, lumbar region: Secondary | ICD-10-CM | POA: Diagnosis not present

## 2016-04-22 DIAGNOSIS — M47816 Spondylosis without myelopathy or radiculopathy, lumbar region: Secondary | ICD-10-CM | POA: Diagnosis not present

## 2016-04-22 DIAGNOSIS — M5416 Radiculopathy, lumbar region: Secondary | ICD-10-CM | POA: Diagnosis not present

## 2016-04-24 DIAGNOSIS — M545 Low back pain: Secondary | ICD-10-CM | POA: Diagnosis not present

## 2016-04-24 DIAGNOSIS — M810 Age-related osteoporosis without current pathological fracture: Secondary | ICD-10-CM | POA: Diagnosis not present

## 2016-04-24 DIAGNOSIS — Z136 Encounter for screening for cardiovascular disorders: Secondary | ICD-10-CM | POA: Diagnosis not present

## 2016-04-24 DIAGNOSIS — K5730 Diverticulosis of large intestine without perforation or abscess without bleeding: Secondary | ICD-10-CM | POA: Diagnosis not present

## 2016-04-24 DIAGNOSIS — Z125 Encounter for screening for malignant neoplasm of prostate: Secondary | ICD-10-CM | POA: Diagnosis not present

## 2016-04-24 DIAGNOSIS — Z0000 Encounter for general adult medical examination without abnormal findings: Secondary | ICD-10-CM | POA: Diagnosis not present

## 2016-04-24 DIAGNOSIS — E291 Testicular hypofunction: Secondary | ICD-10-CM | POA: Diagnosis not present

## 2016-04-24 DIAGNOSIS — G8929 Other chronic pain: Secondary | ICD-10-CM | POA: Diagnosis not present

## 2016-05-01 DIAGNOSIS — G8929 Other chronic pain: Secondary | ICD-10-CM | POA: Diagnosis not present

## 2016-05-01 DIAGNOSIS — E291 Testicular hypofunction: Secondary | ICD-10-CM | POA: Diagnosis not present

## 2016-05-01 DIAGNOSIS — G5790 Unspecified mononeuropathy of unspecified lower limb: Secondary | ICD-10-CM | POA: Diagnosis not present

## 2016-05-01 DIAGNOSIS — K5730 Diverticulosis of large intestine without perforation or abscess without bleeding: Secondary | ICD-10-CM | POA: Diagnosis not present

## 2016-05-01 DIAGNOSIS — M545 Low back pain: Secondary | ICD-10-CM | POA: Diagnosis not present

## 2016-08-22 DIAGNOSIS — Z0100 Encounter for examination of eyes and vision without abnormal findings: Secondary | ICD-10-CM | POA: Diagnosis not present

## 2016-08-22 DIAGNOSIS — H2513 Age-related nuclear cataract, bilateral: Secondary | ICD-10-CM | POA: Diagnosis not present

## 2016-11-01 DIAGNOSIS — E291 Testicular hypofunction: Secondary | ICD-10-CM | POA: Diagnosis not present

## 2016-11-01 DIAGNOSIS — M545 Low back pain: Secondary | ICD-10-CM | POA: Diagnosis not present

## 2016-11-01 DIAGNOSIS — G5790 Unspecified mononeuropathy of unspecified lower limb: Secondary | ICD-10-CM | POA: Diagnosis not present

## 2016-11-01 DIAGNOSIS — K5730 Diverticulosis of large intestine without perforation or abscess without bleeding: Secondary | ICD-10-CM | POA: Diagnosis not present

## 2016-11-01 DIAGNOSIS — G8929 Other chronic pain: Secondary | ICD-10-CM | POA: Diagnosis not present

## 2016-11-01 DIAGNOSIS — E039 Hypothyroidism, unspecified: Secondary | ICD-10-CM | POA: Diagnosis not present

## 2016-11-07 DIAGNOSIS — E039 Hypothyroidism, unspecified: Secondary | ICD-10-CM | POA: Diagnosis not present

## 2016-11-07 DIAGNOSIS — K5909 Other constipation: Secondary | ICD-10-CM | POA: Diagnosis not present

## 2016-11-07 DIAGNOSIS — E291 Testicular hypofunction: Secondary | ICD-10-CM | POA: Diagnosis not present

## 2016-11-07 DIAGNOSIS — H918X9 Other specified hearing loss, unspecified ear: Secondary | ICD-10-CM | POA: Diagnosis not present

## 2016-11-07 DIAGNOSIS — Z0000 Encounter for general adult medical examination without abnormal findings: Secondary | ICD-10-CM | POA: Diagnosis not present

## 2016-11-07 DIAGNOSIS — G8929 Other chronic pain: Secondary | ICD-10-CM | POA: Diagnosis not present

## 2016-11-07 DIAGNOSIS — M545 Low back pain: Secondary | ICD-10-CM | POA: Diagnosis not present

## 2016-11-29 DIAGNOSIS — H903 Sensorineural hearing loss, bilateral: Secondary | ICD-10-CM | POA: Diagnosis not present

## 2016-11-29 DIAGNOSIS — H9311 Tinnitus, right ear: Secondary | ICD-10-CM | POA: Diagnosis not present

## 2016-12-24 DIAGNOSIS — H903 Sensorineural hearing loss, bilateral: Secondary | ICD-10-CM | POA: Diagnosis not present

## 2017-02-22 DIAGNOSIS — K3580 Unspecified acute appendicitis: Secondary | ICD-10-CM | POA: Diagnosis not present

## 2017-02-22 DIAGNOSIS — R1031 Right lower quadrant pain: Secondary | ICD-10-CM | POA: Diagnosis not present

## 2017-05-01 DIAGNOSIS — Z0000 Encounter for general adult medical examination without abnormal findings: Secondary | ICD-10-CM | POA: Diagnosis not present

## 2017-05-01 DIAGNOSIS — G8929 Other chronic pain: Secondary | ICD-10-CM | POA: Diagnosis not present

## 2017-05-01 DIAGNOSIS — Z125 Encounter for screening for malignant neoplasm of prostate: Secondary | ICD-10-CM | POA: Diagnosis not present

## 2017-05-01 DIAGNOSIS — E039 Hypothyroidism, unspecified: Secondary | ICD-10-CM | POA: Diagnosis not present

## 2017-05-01 DIAGNOSIS — H918X9 Other specified hearing loss, unspecified ear: Secondary | ICD-10-CM | POA: Diagnosis not present

## 2017-05-01 DIAGNOSIS — M545 Low back pain: Secondary | ICD-10-CM | POA: Diagnosis not present

## 2017-05-01 DIAGNOSIS — R7989 Other specified abnormal findings of blood chemistry: Secondary | ICD-10-CM | POA: Diagnosis not present

## 2017-05-01 DIAGNOSIS — K5909 Other constipation: Secondary | ICD-10-CM | POA: Diagnosis not present

## 2017-05-07 ENCOUNTER — Other Ambulatory Visit: Payer: Self-pay | Admitting: Internal Medicine

## 2017-05-07 DIAGNOSIS — M47816 Spondylosis without myelopathy or radiculopathy, lumbar region: Secondary | ICD-10-CM

## 2017-05-07 DIAGNOSIS — M48062 Spinal stenosis, lumbar region with neurogenic claudication: Secondary | ICD-10-CM

## 2017-05-07 DIAGNOSIS — R7989 Other specified abnormal findings of blood chemistry: Secondary | ICD-10-CM | POA: Diagnosis not present

## 2017-05-07 DIAGNOSIS — D500 Iron deficiency anemia secondary to blood loss (chronic): Secondary | ICD-10-CM | POA: Diagnosis not present

## 2017-05-07 DIAGNOSIS — E039 Hypothyroidism, unspecified: Secondary | ICD-10-CM | POA: Diagnosis not present

## 2017-05-07 DIAGNOSIS — R29818 Other symptoms and signs involving the nervous system: Secondary | ICD-10-CM

## 2017-05-07 DIAGNOSIS — G9519 Other vascular myelopathies: Secondary | ICD-10-CM

## 2017-05-14 ENCOUNTER — Ambulatory Visit
Admission: RE | Admit: 2017-05-14 | Discharge: 2017-05-14 | Disposition: A | Discharge status: Home / Self Care | Payer: Medicare HMO | Source: Ambulatory Visit | Attending: Internal Medicine | Admitting: Internal Medicine

## 2017-05-14 DIAGNOSIS — M47816 Spondylosis without myelopathy or radiculopathy, lumbar region: Secondary | ICD-10-CM | POA: Diagnosis not present

## 2017-05-14 DIAGNOSIS — M48062 Spinal stenosis, lumbar region with neurogenic claudication: Secondary | ICD-10-CM | POA: Insufficient documentation

## 2017-05-14 DIAGNOSIS — M5127 Other intervertebral disc displacement, lumbosacral region: Secondary | ICD-10-CM | POA: Insufficient documentation

## 2017-05-14 DIAGNOSIS — M5126 Other intervertebral disc displacement, lumbar region: Secondary | ICD-10-CM | POA: Insufficient documentation

## 2017-05-14 DIAGNOSIS — G9519 Other vascular myelopathies: Secondary | ICD-10-CM

## 2017-05-14 MED ORDER — GADOBENATE DIMEGLUMINE 529 MG/ML IV SOLN
20.0000 mL | Freq: Once | INTRAVENOUS | Status: AC | PRN
  Administered 2017-05-14: 20 mL via INTRAVENOUS

## 2017-05-29 DIAGNOSIS — M545 Low back pain: Secondary | ICD-10-CM | POA: Diagnosis not present

## 2017-05-29 DIAGNOSIS — M48062 Spinal stenosis, lumbar region with neurogenic claudication: Secondary | ICD-10-CM | POA: Diagnosis not present

## 2017-05-29 DIAGNOSIS — Z6826 Body mass index (BMI) 26.0-26.9, adult: Secondary | ICD-10-CM | POA: Diagnosis not present

## 2017-05-29 DIAGNOSIS — R030 Elevated blood-pressure reading, without diagnosis of hypertension: Secondary | ICD-10-CM | POA: Diagnosis not present

## 2017-06-10 ENCOUNTER — Other Ambulatory Visit: Payer: Self-pay | Admitting: Neurosurgery

## 2017-06-10 DIAGNOSIS — M48062 Spinal stenosis, lumbar region with neurogenic claudication: Secondary | ICD-10-CM

## 2017-06-23 ENCOUNTER — Ambulatory Visit
Admission: RE | Admit: 2017-06-23 | Discharge: 2017-06-23 | Disposition: A | Discharge status: Home / Self Care | Payer: Medicare HMO | Source: Ambulatory Visit | Attending: Neurosurgery | Admitting: Neurosurgery

## 2017-06-23 DIAGNOSIS — M4807 Spinal stenosis, lumbosacral region: Secondary | ICD-10-CM | POA: Diagnosis not present

## 2017-06-23 DIAGNOSIS — M48062 Spinal stenosis, lumbar region with neurogenic claudication: Secondary | ICD-10-CM

## 2017-06-23 MED ORDER — ONDANSETRON HCL 4 MG/2ML IJ SOLN: 4.0000 mg | Freq: Four times a day (QID) | INTRAMUSCULAR | Status: DC | PRN

## 2017-06-23 MED ORDER — IOPAMIDOL (ISOVUE-M 200) INJECTION 41%
15.0000 mL | Freq: Once | INTRAMUSCULAR | Status: AC
  Administered 2017-06-23: 15 mL via INTRATHECAL

## 2017-06-23 MED ORDER — DIAZEPAM 5 MG PO TABS
5.0000 mg | ORAL_TABLET | Freq: Once | ORAL | Status: AC
  Administered 2017-06-23: 5 mg via ORAL

## 2017-06-23 NOTE — Discharge Instructions (Signed)

## 2017-09-05 DIAGNOSIS — M545 Low back pain: Secondary | ICD-10-CM | POA: Diagnosis not present

## 2017-09-25 IMAGING — CT CT L SPINE W/ CM
1 of 6 series · 5 of 14 positions shown, 7 images · non-contrast
Comparison: 05/14/2017 lumbar spine MRI

CLINICAL DATA: Low back pain and bilateral lower extremity
weakness, worse with standing.
TECHNIQUE: Contiguous axial images were obtained through the Lumbar spine after
the intrathecal infusion of infusion. Coronal and sagittal
reconstructions were obtained of the axial image sets.

[Series 2: l spine soft · axial · 0.27mm/px · z∈[-338,-182]mm · 5 of 79 slices shown, 7 images]
[im 14/79  soft-tissue]
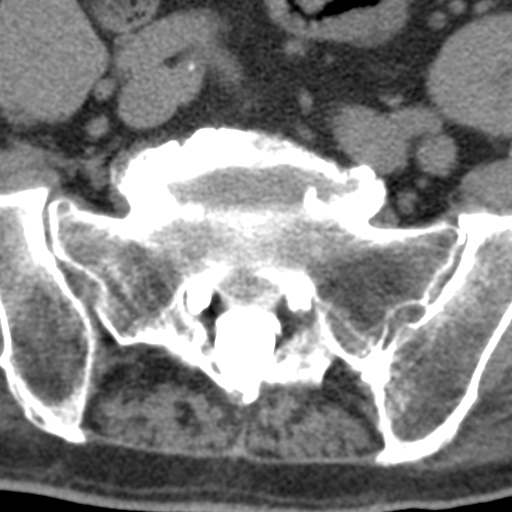
[im 14/79  bone]
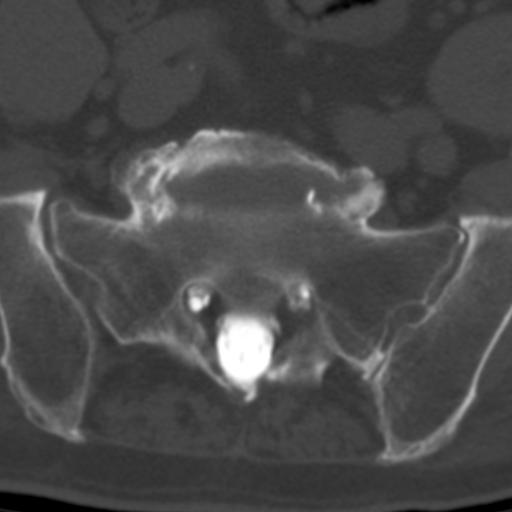
[im 27/79  bone]
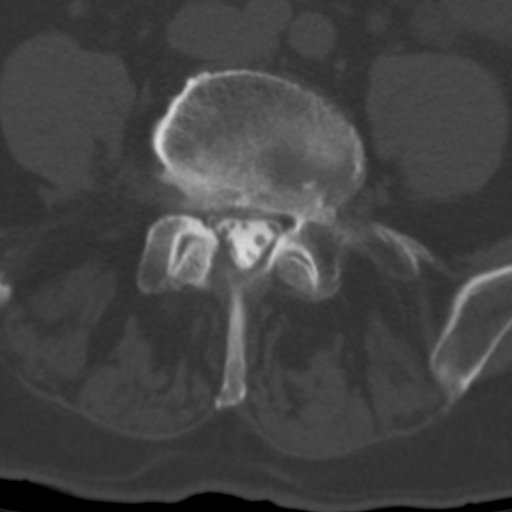
[im 40/79  bone]
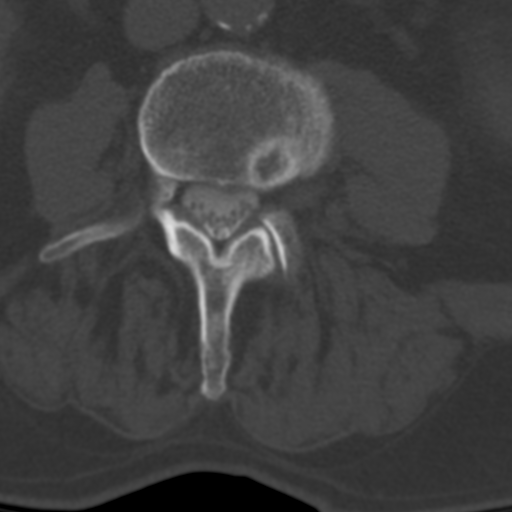
[im 53/79  bone]
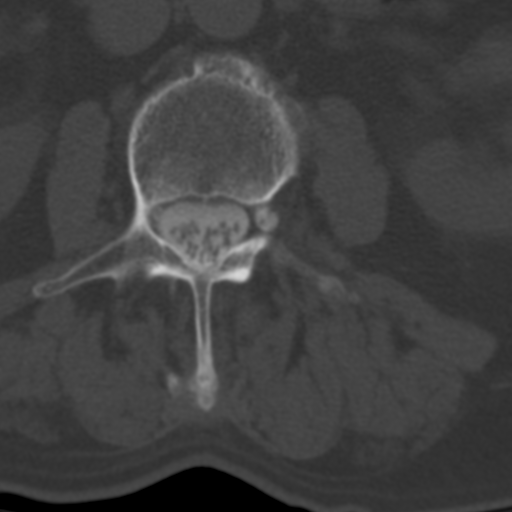
[im 66/79  soft-tissue]
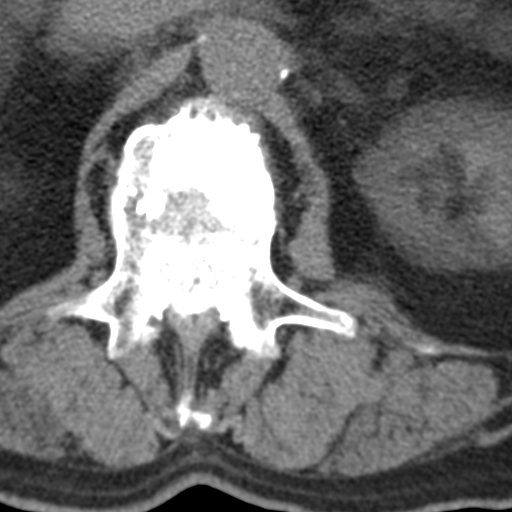
[im 66/79  bone]
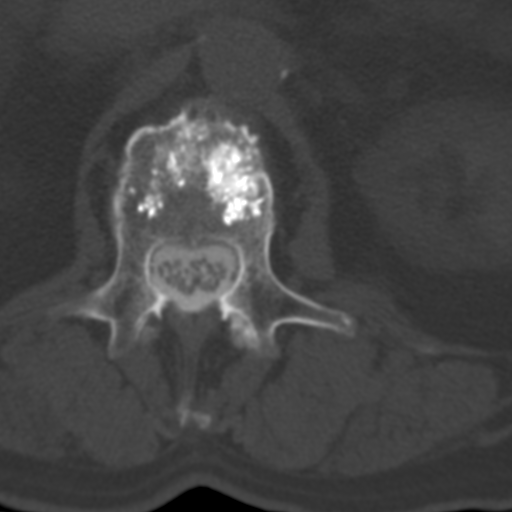

[5 of 14 positions shown; findings below may reference images not displayed]

EXAM:
LUMBAR MYELOGRAM

FLUOROSCOPY TIME:  Radiation Exposure Index (as provided by the
fluoroscopic device): 338.31 microGray*m^2

Fluoroscopy Time (in minutes and seconds):  25 seconds

PROCEDURE:
After thorough discussion of risks and benefits of the procedure
including bleeding, infection, injury to nerves, blood vessels,
adjacent structures as well as headache and CSF leak, written and
oral informed consent was obtained. Consent was obtained by Dr.
Pellumb Gjini Jeshile. Time out form was completed.

Patient was positioned prone on the fluoroscopy table. Local
anesthesia was provided with 1% lidocaine without epinephrine after
prepped and draped in the usual sterile fashion. Puncture was
performed at L4-5 using a 3 1/2 inch 22-gauge spinal needle via a
left interlaminar approach. Using a single pass through the dura,
the needle was placed within the thecal sac, with return of clear
CSF. 15 mL of Isovue-M 200 was injected into the thecal sac, with
normal opacification of the nerve roots and cauda equina consistent
with free flow within the subarachnoid space.

I personally performed the lumbar puncture and administered the
intrathecal contrast. I also personally supervised acquisition of
the myelogram images.
FINDINGS: LUMBAR MYELOGRAM FINDINGS:

There are 5 non rib-bearing lumbar type vertebrae. There is minimal
retrolisthesis of L2 on L3 without significant change during flexion
or extension. A ventral extradural defect at L3-4 results in mild
spinal stenosis which may slightly worsen with standing. Small
ventral extradural defects are present at L2-3 and L4-5 without
evidence of significant spinal stenosis. There is the suggestion of
asymmetric right lateral recess narrowing at L4-5 and at L2-3.

CT LUMBAR MYELOGRAM FINDINGS:

Minimal retrolisthesis of L2 on L3 measures 2 mm and is unchanged. A
chronic L1 compression fracture is again noted with mild vertebral
height loss and prior cement augmentation. No new fracture is
identified. L2 and L3 Schmorl's nodes are again noted. Vacuum disc
phenomenon is present from L1-2 to L4-5 with associated mild disc
space narrowing.

The conus medullaris terminates at the upper L1 level. A 7 mm
intradural nodule just left of midline at L1-2 is unchanged. Aortic
atherosclerosis is noted.

T12-L1: Small left paracentral osteophyte without stenosis,
unchanged.

L1-2: Circumferential disc bulging asymmetric to the left, endplate
spurring, and mild facet hypertrophy result in minimal left lateral
recess stenosis without significant spinal or neural foraminal
stenosis, unchanged.

L2-3: Disc bulging and mild facet hypertrophy result in minimal
bilateral lateral recess stenosis without significant spinal or
neural foraminal stenosis, unchanged.

L3-4: Disc bulging, small central calcified disc protrusion, and
mild-to-moderate facet hypertrophy result in mild-to-moderate right
lateral recess stenosis, mild overall spinal stenosis, and minimal
right neural foraminal stenosis.

L4-5: Disc bulging, endplate spurring, and mild facet hypertrophy
result in mild-to-moderate right and mild left lateral recess
stenosis and mild bilateral neural foraminal stenosis, unchanged. No
spinal stenosis.

L5-S1: Partial ankylosis across the disc space and facets. Foraminal
endplate spurring results in mild bilateral neural foraminal
stenosis, unchanged. No spinal stenosis.
IMPRESSION: 1. Multilevel lumbar disc and facet degeneration.
2. Mild-to-moderate right lateral recess and mild spinal stenosis at
L3-4.
3. Mild-to-moderate right and mild left lateral recess stenosis at
L4-5.
4. Mild bilateral neural foraminal stenosis at L4-5 and L5-S1.
5. Chronic 7 mm intradural nodule at L1-2, likely a small nerve
sheath tumor.
6.  Aortic Atherosclerosis ([id number]).

## 2017-10-15 DIAGNOSIS — M47816 Spondylosis without myelopathy or radiculopathy, lumbar region: Secondary | ICD-10-CM | POA: Diagnosis not present

## 2017-11-04 DIAGNOSIS — H52223 Regular astigmatism, bilateral: Secondary | ICD-10-CM | POA: Diagnosis not present

## 2017-11-04 DIAGNOSIS — Z9842 Cataract extraction status, left eye: Secondary | ICD-10-CM | POA: Diagnosis not present

## 2017-11-04 DIAGNOSIS — H353131 Nonexudative age-related macular degeneration, bilateral, early dry stage: Secondary | ICD-10-CM | POA: Diagnosis not present

## 2017-11-04 DIAGNOSIS — Z0100 Encounter for examination of eyes and vision without abnormal findings: Secondary | ICD-10-CM | POA: Diagnosis not present

## 2017-11-04 DIAGNOSIS — Z9841 Cataract extraction status, right eye: Secondary | ICD-10-CM | POA: Diagnosis not present

## 2017-11-06 DIAGNOSIS — R7989 Other specified abnormal findings of blood chemistry: Secondary | ICD-10-CM | POA: Diagnosis not present

## 2017-11-06 DIAGNOSIS — M47816 Spondylosis without myelopathy or radiculopathy, lumbar region: Secondary | ICD-10-CM | POA: Diagnosis not present

## 2017-11-06 DIAGNOSIS — M48062 Spinal stenosis, lumbar region with neurogenic claudication: Secondary | ICD-10-CM | POA: Diagnosis not present

## 2017-11-06 DIAGNOSIS — Z0000 Encounter for general adult medical examination without abnormal findings: Secondary | ICD-10-CM | POA: Diagnosis not present

## 2017-11-06 DIAGNOSIS — E039 Hypothyroidism, unspecified: Secondary | ICD-10-CM | POA: Diagnosis not present

## 2017-11-13 DIAGNOSIS — R1013 Epigastric pain: Secondary | ICD-10-CM | POA: Diagnosis not present

## 2017-11-13 DIAGNOSIS — G8929 Other chronic pain: Secondary | ICD-10-CM | POA: Diagnosis not present

## 2017-11-13 DIAGNOSIS — K5730 Diverticulosis of large intestine without perforation or abscess without bleeding: Secondary | ICD-10-CM | POA: Diagnosis not present

## 2017-11-13 DIAGNOSIS — Z0000 Encounter for general adult medical examination without abnormal findings: Secondary | ICD-10-CM | POA: Diagnosis not present

## 2017-11-13 DIAGNOSIS — M810 Age-related osteoporosis without current pathological fracture: Secondary | ICD-10-CM | POA: Diagnosis not present

## 2017-11-13 DIAGNOSIS — R7989 Other specified abnormal findings of blood chemistry: Secondary | ICD-10-CM | POA: Diagnosis not present

## 2017-11-13 DIAGNOSIS — M545 Low back pain: Secondary | ICD-10-CM | POA: Diagnosis not present

## 2017-11-18 DIAGNOSIS — M47816 Spondylosis without myelopathy or radiculopathy, lumbar region: Secondary | ICD-10-CM | POA: Diagnosis not present

## 2018-01-06 DIAGNOSIS — M47816 Spondylosis without myelopathy or radiculopathy, lumbar region: Secondary | ICD-10-CM | POA: Diagnosis not present

## 2018-02-22 ENCOUNTER — Emergency Department: Payer: Medicare HMO | Admitting: Anesthesiology

## 2018-02-22 ENCOUNTER — Encounter
Admission: EM | Disposition: A | Discharge status: Home / Self Care | Payer: Self-pay | Source: Home / Self Care | Attending: Emergency Medicine

## 2018-02-22 ENCOUNTER — Emergency Department: Payer: Medicare HMO

## 2018-02-22 ENCOUNTER — Other Ambulatory Visit: Payer: Self-pay

## 2018-02-22 ENCOUNTER — Observation Stay
Admission: EM | Admit: 2018-02-22 | Discharge: 2018-02-23 | Disposition: A | Discharge status: Home / Self Care | Payer: Medicare HMO | Attending: Surgery | Admitting: Surgery

## 2018-02-22 DIAGNOSIS — I700 Atherosclerosis of aorta: Secondary | ICD-10-CM | POA: Diagnosis not present

## 2018-02-22 DIAGNOSIS — K381 Appendicular concretions: Secondary | ICD-10-CM | POA: Insufficient documentation

## 2018-02-22 DIAGNOSIS — K353 Acute appendicitis with localized peritonitis, without perforation or gangrene: Principal | ICD-10-CM | POA: Insufficient documentation

## 2018-02-22 DIAGNOSIS — K5730 Diverticulosis of large intestine without perforation or abscess without bleeding: Secondary | ICD-10-CM | POA: Diagnosis not present

## 2018-02-22 DIAGNOSIS — F329 Major depressive disorder, single episode, unspecified: Secondary | ICD-10-CM | POA: Diagnosis not present

## 2018-02-22 DIAGNOSIS — K3580 Unspecified acute appendicitis: Secondary | ICD-10-CM | POA: Diagnosis not present

## 2018-02-22 DIAGNOSIS — E039 Hypothyroidism, unspecified: Secondary | ICD-10-CM | POA: Insufficient documentation

## 2018-02-22 DIAGNOSIS — Z7982 Long term (current) use of aspirin: Secondary | ICD-10-CM | POA: Diagnosis not present

## 2018-02-22 DIAGNOSIS — K37 Unspecified appendicitis: Secondary | ICD-10-CM | POA: Diagnosis not present

## 2018-02-22 DIAGNOSIS — K219 Gastro-esophageal reflux disease without esophagitis: Secondary | ICD-10-CM | POA: Insufficient documentation

## 2018-02-22 DIAGNOSIS — D509 Iron deficiency anemia, unspecified: Secondary | ICD-10-CM | POA: Diagnosis not present

## 2018-02-22 DIAGNOSIS — R1031 Right lower quadrant pain: Secondary | ICD-10-CM | POA: Diagnosis not present

## 2018-02-22 DIAGNOSIS — Z79899 Other long term (current) drug therapy: Secondary | ICD-10-CM | POA: Insufficient documentation

## 2018-02-22 DIAGNOSIS — Z96643 Presence of artificial hip joint, bilateral: Secondary | ICD-10-CM | POA: Diagnosis not present

## 2018-02-22 DIAGNOSIS — Z87891 Personal history of nicotine dependence: Secondary | ICD-10-CM | POA: Diagnosis not present

## 2018-02-22 HISTORY — PX: LAPAROSCOPIC APPENDECTOMY: SHX408

## 2018-02-22 LAB — COMPREHENSIVE METABOLIC PANEL
ALBUMIN: 4.2 g/dL (ref 3.5–5.0)
ALK PHOS: 52 U/L (ref 38–126)
ALT: 19 U/L (ref 17–63)
AST: 34 U/L (ref 15–41)
Anion gap: 6 (ref 5–15)
BILIRUBIN TOTAL: 1.5 mg/dL — AB (ref 0.3–1.2)
BUN: 14 mg/dL (ref 6–20)
CALCIUM: 8.8 mg/dL — AB (ref 8.9–10.3)
CO2: 30 mmol/L (ref 22–32)
Chloride: 99 mmol/L — ABNORMAL LOW (ref 101–111)
Creatinine, Ser: 0.71 mg/dL (ref 0.61–1.24)
GFR calc Af Amer: >60 mL/min (ref >60)
GFR calc non Af Amer: >60 mL/min (ref >60)
GLUCOSE: 117 mg/dL — AB (ref 65–99)
Potassium: 4.4 mmol/L (ref 3.5–5.1)
SODIUM: 135 mmol/L (ref 135–145)
TOTAL PROTEIN: 7.4 g/dL (ref 6.5–8.1)

## 2018-02-22 LAB — CBC
HEMATOCRIT: 43.3 % (ref 40.0–52.0)
Hemoglobin: 14.8 g/dL (ref 13.0–18.0)
MCH: 31 pg (ref 26.0–34.0)
MCHC: 34.1 g/dL (ref 32.0–36.0)
MCV: 91 fL (ref 80.0–100.0)
Platelets: 161 10*3/uL (ref 150–440)
RBC: 4.76 MIL/uL (ref 4.40–5.90)
RDW: 12.8 % (ref 11.5–14.5)
WBC: 11.2 10*3/uL — ABNORMAL HIGH (ref 3.8–10.6)

## 2018-02-22 LAB — LIPASE, BLOOD: Lipase: 22 U/L (ref 11–51)

## 2018-02-22 SURGERY — APPENDECTOMY, LAPAROSCOPIC
Anesthesia: General

## 2018-02-22 MED ORDER — LIDOCAINE HCL (PF) 2 % IJ SOLN
INTRAMUSCULAR | Status: AC
  Filled 2018-02-22: qty 10

## 2018-02-22 MED ORDER — LIDOCAINE HCL 1 % IJ SOLN
INTRAMUSCULAR | Status: DC | PRN
  Administered 2018-02-22: 20 mL

## 2018-02-22 MED ORDER — ROCURONIUM BROMIDE 50 MG/5ML IV SOLN
INTRAVENOUS | Status: AC
  Filled 2018-02-22: qty 1

## 2018-02-22 MED ORDER — GABAPENTIN 300 MG PO CAPS
300.0000 mg | ORAL_CAPSULE | ORAL | Status: AC
  Administered 2018-02-23: 300 mg via ORAL
  Filled 2018-02-22: qty 1

## 2018-02-22 MED ORDER — ROCURONIUM BROMIDE 50 MG/5ML IV SOLN
INTRAVENOUS | Status: AC
Start: 2018-02-22 — End: ?
  Filled 2018-02-22: qty 1

## 2018-02-22 MED ORDER — MORPHINE SULFATE (PF) 4 MG/ML IV SOLN
4.0000 mg | Freq: Once | INTRAVENOUS | Status: AC
  Administered 2018-02-22: 4 mg via INTRAVENOUS
  Filled 2018-02-22: qty 1

## 2018-02-22 MED ORDER — PROPOFOL 10 MG/ML IV BOLUS
INTRAVENOUS | Status: DC | PRN
  Administered 2018-02-22: 20 mg via INTRAVENOUS
  Administered 2018-02-22: 150 mg via INTRAVENOUS

## 2018-02-22 MED ORDER — CHLORHEXIDINE GLUCONATE CLOTH 2 % EX PADS: 6.0000 | MEDICATED_PAD | Freq: Once | CUTANEOUS | Status: DC

## 2018-02-22 MED ORDER — DEXAMETHASONE SODIUM PHOSPHATE 10 MG/ML IJ SOLN
INTRAMUSCULAR | Status: AC
  Filled 2018-02-22: qty 1

## 2018-02-22 MED ORDER — ONDANSETRON HCL 4 MG/2ML IJ SOLN
4.0000 mg | Freq: Once | INTRAMUSCULAR | Status: AC
  Administered 2018-02-22: 4 mg via INTRAVENOUS
  Filled 2018-02-22: qty 2

## 2018-02-22 MED ORDER — MORPHINE SULFATE (PF) 2 MG/ML IV SOLN: 2.0000 mg | INTRAVENOUS | Status: DC | PRN

## 2018-02-22 MED ORDER — ACETAMINOPHEN 650 MG RE SUPP: 650.0000 mg | Freq: Four times a day (QID) | RECTAL | Status: DC | PRN

## 2018-02-22 MED ORDER — FENTANYL CITRATE (PF) 100 MCG/2ML IJ SOLN: 25.0000 ug | INTRAMUSCULAR | Status: DC | PRN

## 2018-02-22 MED ORDER — ENOXAPARIN SODIUM 40 MG/0.4ML ~~LOC~~ SOLN
40.0000 mg | SUBCUTANEOUS | Status: DC
  Administered 2018-02-23: 40 mg via SUBCUTANEOUS
  Filled 2018-02-22: qty 0.4

## 2018-02-22 MED ORDER — SUCCINYLCHOLINE CHLORIDE 20 MG/ML IJ SOLN
INTRAMUSCULAR | Status: AC
  Filled 2018-02-22: qty 1

## 2018-02-22 MED ORDER — DEXAMETHASONE SODIUM PHOSPHATE 10 MG/ML IJ SOLN
INTRAMUSCULAR | Status: DC | PRN
  Administered 2018-02-22: 5 mg via INTRAVENOUS

## 2018-02-22 MED ORDER — ONDANSETRON HCL 4 MG/2ML IJ SOLN
INTRAMUSCULAR | Status: DC | PRN
  Administered 2018-02-22: 4 mg via INTRAVENOUS

## 2018-02-22 MED ORDER — KETOROLAC TROMETHAMINE 30 MG/ML IJ SOLN
INTRAMUSCULAR | Status: AC
  Filled 2018-02-22: qty 1

## 2018-02-22 MED ORDER — SUGAMMADEX SODIUM 200 MG/2ML IV SOLN
INTRAVENOUS | Status: DC | PRN
  Administered 2018-02-22: 150 mg via INTRAVENOUS

## 2018-02-22 MED ORDER — ACETAMINOPHEN 325 MG PO TABS: 650.0000 mg | ORAL_TABLET | Freq: Four times a day (QID) | ORAL | Status: DC | PRN

## 2018-02-22 MED ORDER — ONDANSETRON 4 MG PO TBDP: 4.0000 mg | ORAL_TABLET | Freq: Four times a day (QID) | ORAL | Status: DC | PRN

## 2018-02-22 MED ORDER — EPHEDRINE SULFATE 50 MG/ML IJ SOLN
INTRAMUSCULAR | Status: DC | PRN
  Administered 2018-02-22: 5 mg via INTRAVENOUS

## 2018-02-22 MED ORDER — SUGAMMADEX SODIUM 200 MG/2ML IV SOLN
INTRAVENOUS | Status: AC
  Filled 2018-02-22: qty 2

## 2018-02-22 MED ORDER — ONDANSETRON HCL 4 MG/2ML IJ SOLN: 4.0000 mg | Freq: Four times a day (QID) | INTRAMUSCULAR | Status: DC | PRN

## 2018-02-22 MED ORDER — KETOROLAC TROMETHAMINE 15 MG/ML IJ SOLN: 15.0000 mg | Freq: Four times a day (QID) | INTRAMUSCULAR | Status: DC | PRN

## 2018-02-22 MED ORDER — OXYCODONE HCL 5 MG/5ML PO SOLN: 5.0000 mg | Freq: Once | ORAL | Status: DC | PRN

## 2018-02-22 MED ORDER — OXYCODONE HCL 5 MG PO TABS: 5.0000 mg | ORAL_TABLET | Freq: Once | ORAL | Status: DC | PRN

## 2018-02-22 MED ORDER — CHLORHEXIDINE GLUCONATE CLOTH 2 % EX PADS: 6.0000 | MEDICATED_PAD | Freq: Once | CUTANEOUS | Status: AC

## 2018-02-22 MED ORDER — KETOROLAC TROMETHAMINE 30 MG/ML IJ SOLN
INTRAMUSCULAR | Status: DC | PRN
  Administered 2018-02-22: 15 mg via INTRAVENOUS

## 2018-02-22 MED ORDER — METRONIDAZOLE IN NACL 5-0.79 MG/ML-% IV SOLN: 500.0000 mg | Freq: Three times a day (TID) | INTRAVENOUS | Status: DC

## 2018-02-22 MED ORDER — PROPOFOL 10 MG/ML IV BOLUS
INTRAVENOUS | Status: AC
  Filled 2018-02-22: qty 20

## 2018-02-22 MED ORDER — ACETAMINOPHEN 10 MG/ML IV SOLN
INTRAVENOUS | Status: AC
  Filled 2018-02-22: qty 100

## 2018-02-22 MED ORDER — BUPIVACAINE HCL (PF) 0.5 % IJ SOLN
INTRAMUSCULAR | Status: AC
  Filled 2018-02-22: qty 30

## 2018-02-22 MED ORDER — SODIUM CHLORIDE 0.9 % IV SOLN
2.0000 g | INTRAVENOUS | Status: DC
  Filled 2018-02-22: qty 20

## 2018-02-22 MED ORDER — LACTATED RINGERS IV SOLN
INTRAVENOUS | Status: DC | PRN
  Administered 2018-02-22: 14:00:00 via INTRAVENOUS

## 2018-02-22 MED ORDER — METRONIDAZOLE IN NACL 5-0.79 MG/ML-% IV SOLN
500.0000 mg | Freq: Three times a day (TID) | INTRAVENOUS | Status: AC
  Administered 2018-02-22 – 2018-02-23 (×2): 500 mg via INTRAVENOUS
  Filled 2018-02-22 (×2): qty 100

## 2018-02-22 MED ORDER — KCL IN DEXTROSE-NACL 20-5-0.45 MEQ/L-%-% IV SOLN
INTRAVENOUS | Status: DC
  Administered 2018-02-22 – 2018-02-23 (×2): via INTRAVENOUS
  Filled 2018-02-22 (×4): qty 1000

## 2018-02-22 MED ORDER — OXYCODONE HCL 5 MG PO TABS: 5.0000 mg | ORAL_TABLET | ORAL | Status: DC | PRN

## 2018-02-22 MED ORDER — ACETAMINOPHEN 10 MG/ML IV SOLN
INTRAVENOUS | Status: DC | PRN
  Administered 2018-02-22: 1000 mg via INTRAVENOUS

## 2018-02-22 MED ORDER — CEFTRIAXONE SODIUM 2 G IJ SOLR
2.0000 g | INTRAMUSCULAR | Status: AC
Start: 2018-02-23 — End: 2018-02-23
  Administered 2018-02-22: 2 g via INTRAVENOUS
  Filled 2018-02-22: qty 20

## 2018-02-22 MED ORDER — FENTANYL CITRATE (PF) 100 MCG/2ML IJ SOLN
INTRAMUSCULAR | Status: DC | PRN
  Administered 2018-02-22: 100 ug via INTRAVENOUS

## 2018-02-22 MED ORDER — FENTANYL CITRATE (PF) 100 MCG/2ML IJ SOLN
INTRAMUSCULAR | Status: AC
  Filled 2018-02-22: qty 2

## 2018-02-22 MED ORDER — LEVOTHYROXINE SODIUM 100 MCG PO TABS
200.0000 ug | ORAL_TABLET | Freq: Every day | ORAL | Status: DC
  Administered 2018-02-23: 200 ug via ORAL
  Filled 2018-02-22: qty 2

## 2018-02-22 MED ORDER — ONDANSETRON HCL 4 MG/2ML IJ SOLN
INTRAMUSCULAR | Status: AC
  Filled 2018-02-22: qty 2

## 2018-02-22 MED ORDER — EPHEDRINE SULFATE 50 MG/ML IJ SOLN
INTRAMUSCULAR | Status: AC
  Filled 2018-02-22: qty 1

## 2018-02-22 MED ORDER — SEVOFLURANE IN SOLN
RESPIRATORY_TRACT | Status: AC
  Filled 2018-02-22: qty 250

## 2018-02-22 MED ORDER — BARIUM SULFATE 2.1 % PO SUSP: 450.0000 mL | ORAL | Status: AC

## 2018-02-22 MED ORDER — SUCCINYLCHOLINE CHLORIDE 20 MG/ML IJ SOLN
INTRAMUSCULAR | Status: DC | PRN
  Administered 2018-02-22: 100 mg via INTRAVENOUS

## 2018-02-22 MED ORDER — LIDOCAINE HCL (CARDIAC) PF 100 MG/5ML IV SOSY
PREFILLED_SYRINGE | INTRAVENOUS | Status: DC | PRN
  Administered 2018-02-22: 80 mg via INTRAVENOUS

## 2018-02-22 MED ORDER — LIDOCAINE HCL (PF) 1 % IJ SOLN
INTRAMUSCULAR | Status: AC
  Filled 2018-02-22: qty 30

## 2018-02-22 MED ORDER — ACETAMINOPHEN 500 MG PO TABS
1000.0000 mg | ORAL_TABLET | ORAL | Status: AC
  Administered 2018-02-23: 1000 mg via ORAL
  Filled 2018-02-22: qty 2

## 2018-02-22 MED ORDER — ROCURONIUM BROMIDE 100 MG/10ML IV SOLN
INTRAVENOUS | Status: DC | PRN
  Administered 2018-02-22: 30 mg via INTRAVENOUS
  Administered 2018-02-22: 10 mg via INTRAVENOUS

## 2018-02-22 SURGICAL SUPPLY — 40 items
BLADE CLIPPER SURG (BLADE) ×3 IMPLANT
BLADE SURG SZ11 CARB STEEL (BLADE) ×3 IMPLANT
CANISTER SUCT 1200ML W/VALVE (MISCELLANEOUS) ×3 IMPLANT
CANISTER SUCT 3000ML PPV (MISCELLANEOUS) IMPLANT
CHLORAPREP W/TINT 26ML (MISCELLANEOUS) ×3 IMPLANT
CUTTER FLEX LINEAR 45M (STAPLE) ×3 IMPLANT
DECANTER SPIKE VIAL GLASS SM (MISCELLANEOUS) IMPLANT
DERMABOND ADVANCED (GAUZE/BANDAGES/DRESSINGS) ×2
DERMABOND ADVANCED .7 DNX12 (GAUZE/BANDAGES/DRESSINGS) ×1 IMPLANT
ELECT REM PT RETURN 9FT ADLT (ELECTROSURGICAL) ×3
ELECTRODE REM PT RTRN 9FT ADLT (ELECTROSURGICAL) ×1 IMPLANT
GLOVE BIO SURGEON STRL SZ7 (GLOVE) ×9 IMPLANT
GLOVE BIOGEL PI IND STRL 7.5 (GLOVE) ×1 IMPLANT
GLOVE BIOGEL PI INDICATOR 7.5 (GLOVE) ×2
GOWN STRL REUS W/ TWL LRG LVL4 (GOWN DISPOSABLE) ×1 IMPLANT
GOWN STRL REUS W/ TWL XL LVL3 (GOWN DISPOSABLE) ×1 IMPLANT
GOWN STRL REUS W/TWL LRG LVL4 (GOWN DISPOSABLE) ×2
GOWN STRL REUS W/TWL XL LVL3 (GOWN DISPOSABLE) ×2
GRASPER SUT TROCAR 14GX15 (MISCELLANEOUS) ×3 IMPLANT
IRRIGATION STRYKERFLOW (MISCELLANEOUS) IMPLANT
IRRIGATOR STRYKERFLOW (MISCELLANEOUS)
KIT TURNOVER KIT A (KITS) ×3 IMPLANT
NEEDLE HYPO 22GX1.5 SAFETY (NEEDLE) ×3 IMPLANT
NEEDLE INSUFFLATION 14GA 120MM (NEEDLE) ×3 IMPLANT
NS IRRIG 1000ML POUR BTL (IV SOLUTION) ×3 IMPLANT
PACK LAP CHOLECYSTECTOMY (MISCELLANEOUS) ×3 IMPLANT
POUCH SPECIMEN RETRIEVAL 10MM (ENDOMECHANICALS) ×3 IMPLANT
RELOAD 45 VASCULAR/THIN (ENDOMECHANICALS) IMPLANT
RELOAD STAPLE TA45 3.5 REG BLU (ENDOMECHANICALS) ×3 IMPLANT
SHEARS HARMONIC ACE PLUS 36CM (ENDOMECHANICALS) ×3 IMPLANT
SLEEVE ENDOPATH XCEL 5M (ENDOMECHANICALS) ×3 IMPLANT
SOL .9 NS 3000ML IRR  AL (IV SOLUTION)
SOL .9 NS 3000ML IRR UROMATIC (IV SOLUTION) IMPLANT
SUT MNCRL AB 4-0 PS2 18 (SUTURE) ×3 IMPLANT
SUT VICRYL 0 UR6 27IN ABS (SUTURE) ×3 IMPLANT
SUT VICRYL AB 3-0 FS1 BRD 27IN (SUTURE) ×3 IMPLANT
TRAY FOLEY W/METER SILVER 16FR (SET/KITS/TRAYS/PACK) ×3 IMPLANT
TROCAR XCEL 12X100 BLDLESS (ENDOMECHANICALS) ×3 IMPLANT
TROCAR XCEL NON-BLD 5MMX100MML (ENDOMECHANICALS) ×3 IMPLANT
TUBING INSUFFLATION (TUBING) ×3 IMPLANT

## 2018-02-22 NOTE — Progress Notes (Signed)
Patient given po sprite, patient tolerated well.  States he feels great.  No pain, no complaints.

## 2018-02-22 NOTE — ED Notes (Signed)
Spoke with pharmacy - they still havent sent rocephin - they will send to [facility].

## 2018-02-22 NOTE — ED Notes (Signed)
Pt to surgery.

## 2018-02-22 NOTE — ED Triage Notes (Signed)
Pt c/o right side pain that is tender to the touch - the pain started yesterday - denies V/D - reports nausea - denies difficulty with urination or pain with urination - pt states that eating causes the pain to be worse

## 2018-02-22 NOTE — Anesthesia Postprocedure Evaluation (Signed)
Anesthesia Post Note  Patient: Thomas Palmer.  Procedure(s) Performed: APPENDECTOMY LAPAROSCOPIC (N/A )  Patient location during evaluation: PACU Anesthesia Type: General Level of consciousness: awake and alert Pain management: pain level controlled Vital Signs Assessment: post-procedure vital signs reviewed and stable Respiratory status: spontaneous breathing, nonlabored ventilation, respiratory function stable and patient connected to nasal cannula oxygen Cardiovascular status: blood pressure returned to baseline and stable Postop Assessment: no apparent nausea or vomiting Anesthetic complications: no     Last Vitals:  Vitals:   02/22/18 1633 02/22/18 1649  BP: 125/75 130/78  Pulse: 64 65  Resp: 15 18  Temp: 36.8 C 36.7 C  SpO2: 98% 98%    Last Pain:  Vitals:   02/22/18 1653  TempSrc:   PainSc: 3                  Precious Haws Piscitello

## 2018-02-22 NOTE — Anesthesia Preprocedure Evaluation (Signed)
Anesthesia Evaluation  Patient identified by MRN, date of birth, ID band Patient awake    Reviewed: Allergy & Precautions, H&P , NPO status , Patient's Chart, lab work & pertinent test results  History of Anesthesia Complications Negative for: history of anesthetic complications  Airway Mallampati: III  TM Distance: >3 FB Neck ROM: limited    Dental  (+) Chipped, Poor Dentition, Missing, Caps   Pulmonary neg shortness of breath, pneumonia, former smoker,           Cardiovascular Exercise Tolerance: Good (-) angina(-) Past MI and (-) DOE negative cardio ROS       Neuro/Psych PSYCHIATRIC DISORDERS Depression negative neurological ROS     GI/Hepatic Neg liver ROS, hiatal hernia, PUD, GERD  Medicated and Controlled,  Endo/Other  Hypothyroidism   Renal/GU      Musculoskeletal  (+) Arthritis ,   Abdominal   Peds  Hematology negative hematology ROS (+)   Anesthesia Other Findings Past Medical History: No date: Arthritis     Comment:  "joints in hips and fingers; shoulders too" (07/13/2013) No date: Chronic lower back pain No date: Depression No date: H/O hiatal hernia No date: Hypothyroidism 08/2011: Pneumonia  Past Surgical History: No date: BACK SURGERY     Comment:  kyphoplasty ~ 2009: CARDIAC CATHETERIZATION ~ 2012: FIXATION KYPHOPLASTY LUMBAR SPINE     Comment:  "L1" (07/13/2013) No date: JOINT REPLACEMENT 07/12/2013: TOTAL HIP ARTHROPLASTY; Left     Comment:  Procedure: TOTAL HIP ARTHROPLASTY;  Surgeon: Vickey Huger, MD;  Location: [facility];  Service: Orthopedics;                Laterality: Left; 05/17/2002: TOTAL HIP ARTHROPLASTY; Right  BMI    Body Mass Index:  24.41 kg/m      Reproductive/Obstetrics negative OB ROS                             Anesthesia Physical Anesthesia Plan  ASA: III  Anesthesia Plan: General ETT   Post-op Pain Management:     Induction: Intravenous  PONV Risk Score and Plan: Ondansetron, Dexamethasone, Midazolam and Treatment may vary due to age or medical condition  Airway Management Planned: Oral ETT  Additional Equipment:   Intra-op Plan:   Post-operative Plan: Extubation in OR  Informed Consent: I have reviewed the patients History and Physical, chart, labs and discussed the procedure including the risks, benefits and alternatives for the proposed anesthesia with the patient or authorized representative who has indicated his/her understanding and acceptance.   Dental Advisory Given  Plan Discussed with: Anesthesiologist, CRNA and Surgeon  Anesthesia Plan Comments: (Patient consented for risks of anesthesia including but not limited to:  - adverse reactions to medications - damage to teeth, lips or other oral mucosa - sore throat or hoarseness - Damage to heart, brain, lungs or loss of life  Patient voiced understanding.)        Anesthesia Quick Evaluation

## 2018-02-22 NOTE — Anesthesia Procedure Notes (Signed)
Procedure Name: Intubation Date/Time: 02/22/2018 2:05 PM Performed by: Hedda Slade, CRNA Pre-anesthesia Checklist: Patient identified, Patient being monitored, Timeout performed, Emergency Drugs available and Suction available Patient Re-evaluated:Patient Re-evaluated prior to induction Oxygen Delivery Method: Circle system utilized Preoxygenation: Pre-oxygenation with 100% oxygen Induction Type: IV induction, Rapid sequence and Cricoid Pressure applied Laryngoscope Size: Mac and 4 Grade View: Grade I Tube type: Oral Tube size: 7.5 mm Number of attempts: 1 Airway Equipment and Method: Stylet Placement Confirmation: ETT inserted through vocal cords under direct vision,  positive ETCO2 and breath sounds checked- equal and bilateral Secured at: 22 cm Tube secured with: Tape Dental Injury: Teeth and Oropharynx as per pre-operative assessment

## 2018-02-22 NOTE — Op Note (Signed)
SURGICAL OPERATIVE REPORT  DATE OF PROCEDURE: 02/22/2018  ATTENDING Surgeon(s): Vickie Epley, MD   ANESTHESIA: GETA (General)  PRE-OPERATIVE DIAGNOSIS: Acute non-perforated appendicitis with localized peritonitis (K35.30)  POST-OPERATIVE DIAGNOSIS: Acute non-perforated appendicitis with localized peritonitis (K35.30)  PROCEDURE(S):  1.) Laparoscopic appendectomy (cpt: 30076)  INTRAOPERATIVE FINDINGS: Moderately inflamed non-perforated appendix not surrounded by ascites  INTRAVENOUS FLUIDS: 500 mL crystalloid   ESTIMATED BLOOD LOSS: Minimal (<20 mL)  URINE OUTPUT: 100 mL   SPECIMENS: Appendix  IMPLANTS: None  DRAINS: None  COMPLICATIONS: None apparent  CONDITION AT END OF PROCEDURE: Hemodynamically stable and extubated  DISPOSITION OF PATIENT: PACU  INDICATIONS FOR PROCEDURE:  Patient is a 77 y.o. otherwise reportedly healthy male who presented with acute onset of Right lower quadrant abdominal pain. Patient denies any vomiting, change in bowel habits, fever/chills, CP, or SOB and reported the pain has been well-controlled in the Emergency Department. All risks, benefits, and alternatives to appendectomy were discussed with the patient, all of patient's questions were answered to his expressed satisfaction, and informed consent was obtained and documented.  DETAILS OF PROCEDURE: Patient was brought to the operating suite and appropriately identified. General anesthesia was administered along with confirmation of appropriate pre-operative antibiotics, and endotracheal intubation was performed by anesthetist, along with NG/OG tube for gastric decompression. In supine position, operative site was prepped and draped in usual sterile fashion, and following a brief time out, initial 5 mm incision was made in a natural skin crease just below the umbilicus. Fascia was then elevated, and a Verress needle was inserted and its proper position confirmed using saline meniscus test  prior to abdominal insufflation.  Upon insufflation of the abdominal cavity with carbon dioxide to a well-tolerated pressure of 12-15 mmHg, a 5 mm peri-umbilical port followed by laparoscope were inserted and used to inspect the abdominal cavity and its contents with no injuries from insertion of the first trochar noted. Two additional trocars were inserted, a 12 mm port at the Left lower quadrant position and another 5 mm port at the suprapubic position. The table was then placed in Trendelenburg position with the Right side up, and blunt graspers were gently used to retract the bowel overlying a clearly inflamed appendix surrounded by moderately severe peri-appendiceal inflammation and no appreciable ascites. The appendix was gently retracted by near its tip, and the base of the appendix and mesoappendix were identified in relation to the cecum. The mesoappendix was dissected from the visceral appendix and hemostasis achieved using a harmonic scalpel. Upon freeing the visceral appendix from the mesoappendix, an endostapler loaded with a standard blue tissue load was advanced across the base of the visceral appendix, which was compressed for several seconds, and the stapler was deployed and removed from the abdominal cavity. Hemostasis was confirmed, and the specimen was extracted from the abdominal cavity in a laparoscopic specimen bag.  The intraperitoneal cavity was inspected with no additional findings. PMI laparoscopic fascial closure device was then used to re-approximate fascia at the 12 mm Left lower quadrant port site. All ports were then removed under direct visualization, and the abdominal cavity was desuflated. All port sites were irrigated/cleaned, additional local anesthetic was injected at each incision, 3-0 Vicryl was used to re-approximate dermis at 12 mm port site(s), and subcuticular 4-0 Monocryl suture staples was used to re-approximate skin. Skin was then cleaned, dried, and sterile skin  glue was applied. Patient was then safely able to be awakened, extubated, and transferred to PACU for post-operative monitoring and care.  I was present for all aspects of the above procedure, and no operative complications were apparent.

## 2018-02-22 NOTE — ED Provider Notes (Signed)
[facility] Emergency Department Provider Note   ____________________________________________    I have reviewed the triage vital signs and the nursing notes.   HISTORY  Chief Complaint Abdominal Pain     HPI Thomas Palmer. is a 77 y.o. male who presents with complaints of abdominal pain.  Patient complains of primarily right lower quadrant abdominal pain which started yesterday afternoon and has been continuous with episodes of more severe pain.  He has never had this before.  Does have a history of a hiatal hernia repair.  No fevers chills nausea or vomiting.  Has not taken anything for this.  Is having bowel movements.  Is passing gas.  Pain worsens with eating   Past Medical History:  Diagnosis Date  . Arthritis    "joints in hips and fingers; shoulders too" (07/13/2013)  . Chronic lower back pain   . Depression   . H/O hiatal hernia   . Hypothyroidism   . Pneumonia 08/2011    Patient Active Problem List   Diagnosis Date Noted  . Acquired hypothyroidism 11/07/2016  . Chronic midline low back pain without sciatica 11/02/2015  . Diverticulosis of large intestine without hemorrhage 11/02/2015  . Low serum testosterone level 11/02/2015  . OP (osteoporosis) 11/02/2015  . Gastric ulcer 07/07/2014  . IDA (iron deficiency anemia) 07/07/2014  . S/P total hip arthroplasty 07/27/2013    Past Surgical History:  Procedure Laterality Date  . BACK SURGERY     kyphoplasty  . CARDIAC CATHETERIZATION  ~ 2009  . FIXATION KYPHOPLASTY LUMBAR SPINE  ~ 2012   "L1" (07/13/2013)  . JOINT REPLACEMENT    . TOTAL HIP ARTHROPLASTY Left 07/12/2013   Procedure: TOTAL HIP ARTHROPLASTY;  Surgeon: Vickey Huger, MD;  Location: [place];  Service: Orthopedics;  Laterality: Left;  . TOTAL HIP ARTHROPLASTY Right 05/17/2002    Prior to Admission medications   Medication Sig Start Date End Date Taking? Authorizing Provider  Ascorbic Acid (VITAMIN C) 1000 MG  tablet Take 1,000 mg by mouth daily.   Yes [provider]  aspirin EC 81 MG tablet Take 81 mg by mouth daily.    Yes [provider]  levothyroxine (SYNTHROID, LEVOTHROID) 200 MCG tablet Take by mouth daily before breakfast.    Yes [provider]  Lutein 20 MG CAPS Take 1 capsule by mouth daily.   Yes [provider]  Multiple Vitamins-Minerals (MULTIVITAMIN WITH MINERALS) tablet Take 1 tablet by mouth daily.   Yes [provider]  vitamin B-12 (CYANOCOBALAMIN) 100 MCG tablet Take 100 mcg by mouth daily.  04/26/14  Yes [provider]     Allergies Contrast media [iodinated diagnostic agents]  No family history on file.  Social History Social History   Tobacco Use  . Smoking status: Former Smoker    Years: 30.00    Types: Pipe    Last attempt to quit: 07/07/1989    Years since quitting: 28.6  . Smokeless tobacco: Former Systems developer    Types: [place] date: 10/28/1989  Substance Use Topics  . Alcohol use: Yes    Alcohol/week: 0.6 oz    Types: 1 Cans of beer per week  . Drug use: No    Review of Systems  Constitutional: No fever/chills Eyes: No visual changes.  ENT: No sore throat. Cardiovascular: Denies chest pain. Respiratory: Denies shortness of breath. Gastrointestinal: As above Genitourinary: No dysuria or hematuria Musculoskeletal: No back pain Skin: Negative for rash.  Neurological: Negative for headaches    ____________________________________________   PHYSICAL EXAM:  VITAL SIGNS: ED Triage Vitals [02/22/18 0831]  Enc Vitals Group     BP 116/74     Pulse Rate 72     Resp 16     Temp 97.8 F (36.6 C)     Temp Source Oral     SpO2 97 %     Weight 79.4 kg (175 lb)     Height 1.803 m (5\' 11" )     Head Circumference      Peak Flow      Pain Score 5     Pain Loc      Pain Edu?      Excl. in [place]?     Constitutional: Alert and oriented.  Pleasant and interactive Eyes: Conjunctivae are normal.    Nose: No congestion/rhinnorhea. Mouth/Throat: Mucous membranes are moist.   Neck:  Painless ROM Cardiovascular: Normal rate, regular rhythm. Grossly normal heart sounds.  Good peripheral circulation. Respiratory: Normal respiratory effort.  No retractions. Lungs CTAB. Gastrointestinal: Tenderness palpation right lower quadrant. no distention.  No CVA tenderness.  Musculoskeletal:  Warm and well perfused Neurologic:  Normal speech and language. No gross focal neurologic deficits are appreciated.  Skin:  Skin is warm, dry and intact. No rash noted. Psychiatric: Mood and affect are normal. Speech and behavior are normal.  ____________________________________________   LABS (all labs ordered are listed, but only abnormal results are displayed)  Labs Reviewed  COMPREHENSIVE METABOLIC PANEL - Abnormal; Notable for the following components:      Result Value   Chloride 99 (*)    Glucose, Bld 117 (*)    Calcium 8.8 (*)    Total Bilirubin 1.5 (*)    All other components within normal limits  CBC - Abnormal; Notable for the following components:   WBC 11.2 (*)    All other components within normal limits  LIPASE, BLOOD  URINALYSIS, COMPLETE (UACMP) WITH MICROSCOPIC   ____________________________________________  EKG  None ____________________________________________  RADIOLOGY  CT abdomen pelvis ____________________________________________   PROCEDURES  Procedure(s) performed: No  Procedures   Critical Care performed: No ____________________________________________   INITIAL IMPRESSION / ASSESSMENT AND PLAN / ED COURSE  Pertinent labs & imaging results that were available during my care of the patient were reviewed by me and considered in my medical decision making (see chart for details).  Patient presents with right lower quadrant abdominal pain.  Lab work significant for very mild elevation white blood cell count, otherwise reassuring.  Differential includes  appendicitis, small bowel obstruction, diverticulitis less likely cholecystitis, ureterolithiasis  We will give IV morphine, IV Zofran obtain CT abdomen pelvis, no IV contrast because of reported allergy will give p.o. Contrast  ----------------------------------------- 12:27 PM on 02/22/2018 ----------------------------------------- Discussed with Dr. Rosana Hoes of surgery, CT demonstrates acute appendicitis.  Patient last had coffee at 7:30 AM     ____________________________________________   FINAL CLINICAL IMPRESSION(S) / ED DIAGNOSES  Final diagnoses:  Acute appendicitis, unspecified acute appendicitis type        Note:  This document was prepared using Dragon voice recognition software and may include unintentional dictation errors.    Lavonia Drafts, MD 02/22/18 [phone]

## 2018-02-22 NOTE — Transfer of Care (Signed)
Immediate Anesthesia Transfer of Care Note  Patient: Thomas Palmer.  Procedure(s) Performed: APPENDECTOMY LAPAROSCOPIC (N/A )  Patient Location: PACU  Anesthesia Type:General  Level of Consciousness: sedated  Airway & Oxygen Therapy: Patient Spontanous Breathing and Patient connected to face mask oxygen  Post-op Assessment: Report given to RN and Post -op Vital signs reviewed and stable  Post vital signs: Reviewed and stable  Last Vitals:  Vitals Value Taken Time  BP 123/73 02/22/2018  3:48 PM  Temp 36.6 C 02/22/2018  3:48 PM  Pulse 70 02/22/2018  3:49 PM  Resp 17 02/22/2018  3:49 PM  SpO2 98 % 02/22/2018  3:49 PM  Vitals shown include unvalidated device data.  Last Pain:  Vitals:   02/22/18 1000  TempSrc:   PainSc: 4          Complications: No apparent anesthesia complications

## 2018-02-22 NOTE — Consult Note (Signed)
SURGICAL CONSULTATION NOTE (initial) - cpt: 83382  HISTORY OF PRESENT ILLNESS (HPI):  77 y.o. male presented to [facility] ED today for evaluation of abdominal pain. Patient reports he began experiencing RLQ abdominal pain yesterday after, which became increasingly severe throughout yesterday, becoming severe overnight and prompting presentation to ED today. Patient also reports mild nausea, but denies any fever/chills, emesis, CP, or SOB and states he is easily able to ambulate "miles" (such as around [employer]) without any difficulty or having to stop for CP or SOB.  Surgery is consulted by ED physician Dr. Corky Downs in this context for evaluation and management of acute appendicitis.  PAST MEDICAL HISTORY (PMH):  Past Medical History:  Diagnosis Date  . Arthritis    "joints in hips and fingers; shoulders too" (07/13/2013)  . Chronic lower back pain   . Depression   . H/O hiatal hernia   . Hypothyroidism   . Pneumonia 08/2011     PAST SURGICAL HISTORY [facility]):  Past Surgical History:  Procedure Laterality Date  . BACK SURGERY     kyphoplasty  . CARDIAC CATHETERIZATION  ~ 2009  . FIXATION KYPHOPLASTY LUMBAR SPINE  ~ 2012   "L1" (07/13/2013)  . JOINT REPLACEMENT    . TOTAL HIP ARTHROPLASTY Left 07/12/2013   Procedure: TOTAL HIP ARTHROPLASTY;  Surgeon: Vickey Huger, MD;  Location: [place];  Service: Orthopedics;  Laterality: Left;  . TOTAL HIP ARTHROPLASTY Right 05/17/2002     MEDICATIONS:  Prior to Admission medications   Medication Sig Start Date End Date Taking? Authorizing Provider  Ascorbic Acid (VITAMIN C) 1000 MG tablet Take 1,000 mg by mouth daily.   Yes [provider]  aspirin EC 81 MG tablet Take 81 mg by mouth daily.    Yes [provider]  levothyroxine (SYNTHROID, LEVOTHROID) 200 MCG tablet Take by mouth daily before breakfast.    Yes [provider]  Lutein 20 MG CAPS Take 1 capsule by mouth daily.   Yes [provider]  Multiple  Vitamins-Minerals (MULTIVITAMIN WITH MINERALS) tablet Take 1 tablet by mouth daily.   Yes [provider]  vitamin B-12 (CYANOCOBALAMIN) 100 MCG tablet Take 100 mcg by mouth daily.  04/26/14  Yes [provider]     ALLERGIES:  Allergies  Allergen Reactions  . Contrast Media [Iodinated Diagnostic Agents] Nausea And Vomiting     SOCIAL HISTORY:  Social History   Socioeconomic History  . Marital status: Married    Spouse name: Not on file  . Number of children: Not on file  . Years of education: Not on file  . Highest education level: Not on file  Occupational History  . Not on file  Social Needs  . Financial resource strain: Not on file  . Food insecurity:    Worry: Not on file    Inability: Not on file  . Transportation needs:    Medical: Not on file    Non-medical: Not on file  Tobacco Use  . Smoking status: Former Smoker    Years: 30.00    Types: Pipe    Last attempt to quit: 07/07/1989    Years since quitting: 28.6  . Smokeless tobacco: Former Systems developer    Types: [place] date: 10/28/1989  Substance and Sexual Activity  . Alcohol use: Yes    Alcohol/week: 0.6 oz    Types: 1 Cans of beer per week  . Drug use: No  . Sexual activity: Yes  Lifestyle  . Physical  activity:    Days per week: Not on file    Minutes per session: Not on file  . Stress: Not on file  Relationships  . Social connections:    Talks on phone: Not on file    Gets together: Not on file    Attends religious service: Not on file    Active member of club or organization: Not on file    Attends meetings of clubs or organizations: Not on file    Relationship status: Not on file  . Intimate partner violence:    Fear of current or ex partner: Not on file    Emotionally abused: Not on file    Physically abused: Not on file    Forced sexual activity: Not on file  Other Topics Concern  . Not on file  Social History Narrative  . Not on file    The patient currently resides (home  / rehab facility / nursing home): Home The patient normally is (ambulatory / bedbound): Ambulatory   FAMILY HISTORY:  No family history on file.   REVIEW OF SYSTEMS:  Constitutional: denies weight loss, fever, chills, or sweats  Eyes: denies any other vision changes, history of eye injury  ENT: denies sore throat, hearing problems  Respiratory: denies shortness of breath, wheezing  Cardiovascular: denies chest pain, palpitations  Gastrointestinal: abdominal pain, N/V, and bowel function as per HPI Genitourinary: denies burning with urination or urinary frequency Musculoskeletal: denies any other joint pains or cramps  Skin: denies any other rashes or skin discolorations  Neurological: denies any other headache, dizziness, weakness  Psychiatric: denies any other depression, anxiety   All other review of systems were negative   VITAL SIGNS:  Temp:  [97.8 F (36.6 C)] 97.8 F (36.6 C) (04/28 0831) Pulse Rate:  [72] 72 (04/28 0831) Resp:  [16] 16 (04/28 0831) BP: (116)/(74) 116/74 (04/28 0831) SpO2:  [97 %] 97 % (04/28 0831) Weight:  [175 lb (79.4 kg)] 175 lb (79.4 kg) (04/28 0831)     Height: 5\' 11"  (180.3 cm) Weight: 175 lb (79.4 kg) BMI (Calculated): 24.42   INTAKE/OUTPUT:  This shift: No intake/output data recorded.  Last 2 shifts: @IOLAST2SHIFTS @   PHYSICAL EXAM:  Constitutional:  -- Normal body habitus  -- Awake, alert, and oriented x3, no apparent distress Eyes:  -- Pupils equally round and reactive to light  -- No scleral icterus, B/L no occular discharge Ear, nose, throat: -- Neck is FROM WNL -- No jugular venous distension  Pulmonary:  -- No wheezes or rhales -- Equal breath sounds bilaterally -- Breathing non-labored at rest Cardiovascular:  -- S1, S2 present  -- No pericardial rubs  Gastrointestinal:  -- Abdomen soft and non-distended with moderate focal RLQ tenderness to palpation, no guarding or rebound tenderness -- Well-healed xiphoid to umbilical  post-surgical linear scar consistent with patient's prior laparoscopic converted to open hiatal hernia repair without g-tube -- No abdominal masses appreciated, pulsatile or otherwise  Musculoskeletal and Integumentary:  -- Wounds or skin discoloration: None appreciated -- Extremities: B/L UE and LE FROM, hands and feet warm, no edema  Neurologic:  -- Motor function: Intact and symmetric -- Sensation: Intact and symmetric Psychiatric:  -- Mood and affect WNL  Labs:  CBC Latest Ref Rng & Units 02/22/2018 01/08/2014 12/30/2013  WBC 3.8 - 10.6 K/uL 11.2(H) 9.1 6.5  Hemoglobin 13.0 - 18.0 g/dL 14.8 12.6(L) 13.7  Hematocrit 40.0 - 52.0 % 43.3 39.3(L) 41.8  Platelets 150 - 440 K/uL 161 155  185   CMP Latest Ref Rng & Units 02/22/2018 07/31/2015 07/13/2013  Glucose 65 - 99 mg/dL 117(H) - 99  BUN 6 - 20 mg/dL 14 - 12  Creatinine 0.61 - 1.24 mg/dL 0.71 0.90 0.82  Sodium 135 - 145 mmol/L 135 - 136  Potassium 3.5 - 5.1 mmol/L 4.4 - 5.0  Chloride 101 - 111 mmol/L 99(L) - 99  CO2 22 - 32 mmol/L 30 - 30  Calcium 8.9 - 10.3 mg/dL 8.8(L) - 7.9(L)  Total Protein 6.5 - 8.1 g/dL 7.4 - -  Total Bilirubin 0.3 - 1.2 mg/dL 1.5(H) - -  Alkaline Phos 38 - 126 U/L 52 - -  AST 15 - 41 U/L 34 - -  ALT 17 - 63 U/L 19 - -   Imaging studies:  CT Abdomen and Pelvis with Contrast (02/22/2018) - personally reviewed and discussed with patient, his family, and his RN Findings consistent with acute appendicitis, no evidence of abscess or other complication.  Assessment/Plan: (ICD-10's: K35.30) 77 y.o. male with acute non-perforated appencitis, complicated by mild leukocytosis and prior open hiatal hernia repair as well as by comorbidities including osteoarthritis, hypothyroidism, and chronic lower back pain.   - pain control prn  - NPO for now, IV fluids   - IV antibiotics (ceftriaxone and metronidazole)  - all risks, benefits, and alternatives to laparoscopic appendectomy, possible open appendectomy (particularly  considering prior open abdominal surgery) were discussed with the patient and his wife, all of their questions were answered to their expressed satisfaction, patient expresses he wishes to proceed, and informed consent was obtained.  - will plan for appendectomy pending OR availability  - DVT prophylaxis  All of the above findings and recommendations were discussed with the patient and his wife, and all of patient's and his family's questions were answered to his expressed satisfaction.  Thank you for the opportunity to participate in this patient's care.   -- Marilynne Drivers Rosana Hoes, MD, [place]: [facility] - Partnering for exceptional care. Office: [phone]

## 2018-02-22 NOTE — Progress Notes (Signed)
15 minute call to floor. 

## 2018-02-22 NOTE — ED Notes (Signed)
Patient transported to CT 

## 2018-02-22 NOTE — Progress Notes (Signed)
Changed gown and linen on bed.

## 2018-02-22 NOTE — H&P (Signed)
SURGICAL ADMISSION HISTORY & PHYSICIAN   HISTORY OF PRESENT ILLNESS (HPI):  77 y.o. male presented to [facility] ED today for evaluation of abdominal pain. Patient reports he began experiencing RLQ abdominal pain yesterday after, which became increasingly severe throughout yesterday, becoming severe overnight and prompting presentation to ED today. Patient also reports mild nausea, but denies any fever/chills, emesis, CP, or SOB and states he is easily able to ambulate "miles" (such as around [employer]) without any difficulty or having to stop for CP or SOB.  Surgery is consulted by ED physician Dr. Corky Downs in this context for evaluation and management of acute appendicitis.  PAST MEDICAL HISTORY (PMH):  Past Medical History:  Diagnosis Date  . Arthritis    "joints in hips and fingers; shoulders too" (07/13/2013)  . Chronic lower back pain   . Depression   . H/O hiatal hernia   . Hypothyroidism   . Pneumonia 08/2011     PAST SURGICAL HISTORY [facility]):  Past Surgical History:  Procedure Laterality Date  . BACK SURGERY     kyphoplasty  . CARDIAC CATHETERIZATION  ~ 2009  . FIXATION KYPHOPLASTY LUMBAR SPINE  ~ 2012   "L1" (07/13/2013)  . JOINT REPLACEMENT    . TOTAL HIP ARTHROPLASTY Left 07/12/2013   Procedure: TOTAL HIP ARTHROPLASTY;  Surgeon: Vickey Huger, MD;  Location: [place];  Service: Orthopedics;  Laterality: Left;  . TOTAL HIP ARTHROPLASTY Right 05/17/2002     MEDICATIONS:  Prior to Admission medications   Medication Sig Start Date End Date Taking? Authorizing Provider  Ascorbic Acid (VITAMIN C) 1000 MG tablet Take 1,000 mg by mouth daily.   Yes [provider]  aspirin EC 81 MG tablet Take 81 mg by mouth daily.    Yes [provider]  levothyroxine (SYNTHROID, LEVOTHROID) 200 MCG tablet Take by mouth daily before breakfast.    Yes [provider]  Lutein 20 MG CAPS Take 1 capsule by mouth daily.   Yes [provider]  Multiple  Vitamins-Minerals (MULTIVITAMIN WITH MINERALS) tablet Take 1 tablet by mouth daily.   Yes [provider]  vitamin B-12 (CYANOCOBALAMIN) 100 MCG tablet Take 100 mcg by mouth daily.  04/26/14  Yes [provider]     ALLERGIES:  Allergies  Allergen Reactions  . Contrast Media [Iodinated Diagnostic Agents] Nausea And Vomiting     SOCIAL HISTORY:  Social History   Socioeconomic History  . Marital status: Married    Spouse name: Not on file  . Number of children: Not on file  . Years of education: Not on file  . Highest education level: Not on file  Occupational History  . Not on file  Social Needs  . Financial resource strain: Not on file  . Food insecurity:    Worry: Not on file    Inability: Not on file  . Transportation needs:    Medical: Not on file    Non-medical: Not on file  Tobacco Use  . Smoking status: Former Smoker    Years: 30.00    Types: Pipe    Last attempt to quit: 07/07/1989    Years since quitting: 28.6  . Smokeless tobacco: Former Systems developer    Types: [place] date: 10/28/1989  Substance and Sexual Activity  . Alcohol use: Yes    Alcohol/week: 0.6 oz    Types: 1 Cans of beer per week  . Drug use: No  . Sexual activity: Yes  Lifestyle  . Physical activity:  Days per week: Not on file    Minutes per session: Not on file  . Stress: Not on file  Relationships  . Social connections:    Talks on phone: Not on file    Gets together: Not on file    Attends religious service: Not on file    Active member of club or organization: Not on file    Attends meetings of clubs or organizations: Not on file    Relationship status: Not on file  . Intimate partner violence:    Fear of current or ex partner: Not on file    Emotionally abused: Not on file    Physically abused: Not on file    Forced sexual activity: Not on file  Other Topics Concern  . Not on file  Social History Narrative  . Not on file    The patient currently resides (home  / rehab facility / nursing home): Home The patient normally is (ambulatory / bedbound): Ambulatory   FAMILY HISTORY:  No family history on file.   REVIEW OF SYSTEMS:  Constitutional: denies weight loss, fever, chills, or sweats  Eyes: denies any other vision changes, history of eye injury  ENT: denies sore throat, hearing problems  Respiratory: denies shortness of breath, wheezing  Cardiovascular: denies chest pain, palpitations  Gastrointestinal: abdominal pain, N/V, and bowel function as per HPI Genitourinary: denies burning with urination or urinary frequency Musculoskeletal: denies any other joint pains or cramps  Skin: denies any other rashes or skin discolorations  Neurological: denies any other headache, dizziness, weakness  Psychiatric: denies any other depression, anxiety   All other review of systems were negative   VITAL SIGNS:  Temp:  [97.8 F (36.6 C)] 97.8 F (36.6 C) (04/28 0831) Pulse Rate:  [72] 72 (04/28 0831) Resp:  [16] 16 (04/28 0831) BP: (116)/(74) 116/74 (04/28 0831) SpO2:  [97 %] 97 % (04/28 0831) Weight:  [175 lb (79.4 kg)] 175 lb (79.4 kg) (04/28 0831)     Height: 5\' 11"  (180.3 cm) Weight: 175 lb (79.4 kg) BMI (Calculated): 24.42   INTAKE/OUTPUT:  This shift: No intake/output data recorded.  Last 2 shifts: @IOLAST2SHIFTS @   PHYSICAL EXAM:  Constitutional:  -- Normal body habitus  -- Awake, alert, and oriented x3, no apparent distress Eyes:  -- Pupils equally round and reactive to light  -- No scleral icterus, B/L no occular discharge Ear, nose, throat: -- Neck is FROM WNL -- No jugular venous distension  Pulmonary:  -- No wheezes or rhales -- Equal breath sounds bilaterally -- Breathing non-labored at rest Cardiovascular:  -- S1, S2 present  -- No pericardial rubs  Gastrointestinal:  -- Abdomen soft and non-distended with moderate focal RLQ tenderness to palpation, no guarding or rebound tenderness -- Well-healed xiphoid to umbilical  post-surgical linear scar consistent with patient's prior laparoscopic converted to open hiatal hernia repair without g-tube -- No abdominal masses appreciated, pulsatile or otherwise  Musculoskeletal and Integumentary:  -- Wounds or skin discoloration: None appreciated -- Extremities: B/L UE and LE FROM, hands and feet warm, no edema  Neurologic:  -- Motor function: Intact and symmetric -- Sensation: Intact and symmetric Psychiatric:  -- Mood and affect WNL  Labs:  CBC Latest Ref Rng & Units 02/22/2018 01/08/2014 12/30/2013  WBC 3.8 - 10.6 K/uL 11.2(H) 9.1 6.5  Hemoglobin 13.0 - 18.0 g/dL 14.8 12.6(L) 13.7  Hematocrit 40.0 - 52.0 % 43.3 39.3(L) 41.8  Platelets 150 - 440 K/uL 161 155 185   CMP  Latest Ref Rng & Units 02/22/2018 07/31/2015 07/13/2013  Glucose 65 - 99 mg/dL 117(H) - 99  BUN 6 - 20 mg/dL 14 - 12  Creatinine 0.61 - 1.24 mg/dL 0.71 0.90 0.82  Sodium 135 - 145 mmol/L 135 - 136  Potassium 3.5 - 5.1 mmol/L 4.4 - 5.0  Chloride 101 - 111 mmol/L 99(L) - 99  CO2 22 - 32 mmol/L 30 - 30  Calcium 8.9 - 10.3 mg/dL 8.8(L) - 7.9(L)  Total Protein 6.5 - 8.1 g/dL 7.4 - -  Total Bilirubin 0.3 - 1.2 mg/dL 1.5(H) - -  Alkaline Phos 38 - 126 U/L 52 - -  AST 15 - 41 U/L 34 - -  ALT 17 - 63 U/L 19 - -   Imaging studies:  CT Abdomen and Pelvis with Contrast (02/22/2018) - personally reviewed and discussed with patient, his family, and his RN Findings consistent with acute appendicitis, no evidence of abscess or other complication.  Assessment/Plan: (ICD-10's: K35.30) 77 y.o. male with acute non-perforated appencitis, complicated by mild leukocytosis and prior open hiatal hernia repair as well as by comorbidities including osteoarthritis, hypothyroidism, and chronic lower back pain.   - pain control prn  - NPO for now, IV fluids   - IV antibiotics (ceftriaxone and metronidazole)  - all risks, benefits, and alternatives to laparoscopic appendectomy, possible open appendectomy (particularly  considering prior open abdominal surgery) were discussed with the patient and his wife, all of their questions were answered to their expressed satisfaction, patient expresses he wishes to proceed, and informed consent was obtained.  - will plan for appendectomy pending OR availability  - DVT prophylaxis  All of the above findings and recommendations were discussed with the patient and his wife, and all of patient's and his family's questions were answered to his expressed satisfaction.  -- Marilynne Drivers Rosana Hoes, MD, [place]: [facility] - Partnering for exceptional care. Office: [phone]

## 2018-02-22 NOTE — ED Notes (Signed)
Pt completed Barium sulfate, CT notified.

## 2018-02-22 NOTE — Anesthesia Post-op Follow-up Note (Signed)
Anesthesia QCDR form completed.        

## 2018-02-23 ENCOUNTER — Encounter: Payer: Self-pay | Admitting: Surgery

## 2018-02-23 MED ORDER — OXYCODONE HCL 5 MG PO TABS: 5.0000 mg | ORAL_TABLET | Freq: Four times a day (QID) | ORAL | 0 refills | Status: DC | PRN

## 2018-02-23 NOTE — Discharge Instructions (Signed)
In addition to included general post-operative instructions for Laparoscopic Appendectomy,  Diet: Resume home heart healthy diet.   Activity: No heavy lifting >20 pounds (children, pets, laundry, garbage) or strenuous activity until follow-up, but light activity and walking are encouraged. Do not drive or drink alcohol if taking narcotic pain medications.  Wound care: 2 days after surgery (Tuesday, 4/30), you may shower/get incision wet with soapy water and pat dry (do not rub incisions), but no baths or submerging incision underwater until follow-up.   Medications: Resume all home medications. For mild to moderate pain: acetaminophen (Tylenol) or ibuprofen/naproxen (if no kidney disease). Combining Tylenol with alcohol can substantially increase your risk of causing liver disease. Narcotic pain medications, if prescribed, can be used for severe pain, though may cause nausea, constipation, and drowsiness. Do not combine Tylenol and Percocet (or similar) within a 6 hour period as Percocet (and similar) contain(s) Tylenol. If you do not need the narcotic pain medication, you do not need to fill the prescription.  Call office [phone]) at any time if any questions, worsening pain, fevers/chills, bleeding, drainage from incision site, or other concerns.

## 2018-02-23 NOTE — Discharge Summary (Signed)
Physician Discharge Summary  Patient ID: Thomas Palmer. MRN: [id number] DOB/AGE: [birth year] 77 y.o.  Admit date: 02/22/2018 Discharge date: 02/23/2018   Discharge Diagnoses:  Active Problems:   Acute appendicitis   Procedures: Laparoscopic appendectomy  Hospital Course: This patient status post laparoscopic appendectomy.  He is tolerating a diet and is ready for discharge.  He will follow-up in our office in 10 to 14 days.  He is instructed concerning dressing care with Dermabond and showering.  He will be given oral analgesics for pain.  Consults: None  Disposition: Discharge disposition: 01-Home or Self Care        Allergies as of 02/23/2018      Reactions   Contrast Media [iodinated Diagnostic Agents] Nausea And Vomiting      Medication List    TAKE these medications   aspirin EC 81 MG tablet Take 81 mg by mouth daily.   levothyroxine 200 MCG tablet Commonly known as:  SYNTHROID, LEVOTHROID Take by mouth daily before breakfast.   Lutein 20 MG Caps Take 1 capsule by mouth daily.   multivitamin with minerals tablet Take 1 tablet by mouth daily.   oxyCODONE 5 MG immediate release tablet Commonly known as:  Oxy IR/ROXICODONE Take 1 tablet (5 mg total) by mouth every 6 (six) hours as needed for moderate pain.   vitamin B-12 100 MCG tablet Commonly known as:  CYANOCOBALAMIN Take 100 mcg by mouth daily.   vitamin C 1000 MG tablet Take 1,000 mg by mouth daily.      Follow-up Information     [facility]. Schedule an appointment as soon as possible for a visit in 2 week(s).   Specialty:  General Surgery Contact information: [address] [place]          Florene Glen, MD, FACS

## 2018-02-23 NOTE — Care Management Obs Status (Signed)
[facility] NOTIFICATION   Patient Details  Name: Thomas Palmer. MRN: [id number] Date of Birth: 1941-06-04   Medicare Observation Status Notification Given:  Yes    Shelbie Ammons, RN 02/23/2018, 9:17 AM

## 2018-02-23 NOTE — Progress Notes (Signed)
Discharge teaching given to patient, patient verbalized understanding and had no questions. Patient IV removed. Patient will be transported home by family. All patient belongings gathered prior to leaving.  

## 2018-02-23 NOTE — Care Management Note (Signed)
Case Management Note  Patient Details  Name: Thomas Palmer. MRN: [id number] Date of Birth: 1941-04-15  Subjective/Objective:                  Admitted to [facility] under observation with the diagnosis of acute appendix. Lives with wife, Hassan Rowan [phone]). Last seen Dr. Ginette Pitman in January, Prescriptions are filled at [facility] and per mail order. Home Health in 2014, Doesn't remember name of agency. No skilled nursing. Rolling walker, pulse ox  and cane in th home. Takes care of all basic activities of daily living himself, drives. No falls. Good appetite. Family will transport.  Action/Plan: Lap, appendix 02/22/18 Discharge to home today. No follow-up needs identified   Expected Discharge Date:  02/23/18               Expected Discharge Plan:     In-House Referral:     Discharge planning Services     Post Acute Care Choice:    Choice offered to:     DME Arranged:    DME Agency:     HH Arranged:    HH Agency:     Status of Service:     If discussed at [employer], dates discussed:    Additional Comments:  Shelbie Ammons, RN MSN CCM Care Management [phone] 02/23/2018, 9:32 AM

## 2018-02-23 NOTE — Progress Notes (Signed)
1 Day Post-Op  Subjective: Patient feels well and is tolerating his diet.  He wishes to be discharged this morning.  No nausea vomiting fevers or chills.  Objective: Vital signs in last 24 hours: Temp:  [97.6 F (36.4 C)-98.2 F (36.8 C)] 97.6 F (36.4 C) (04/29 0704) Pulse Rate:  [60-71] 69 (04/29 0704) Resp:  [15-18] 18 (04/29 0704) BP: (102-130)/(65-86) 116/74 (04/29 0704) SpO2:  [91 %-99 %] 94 % (04/29 0704) Last BM Date: 02/22/18  Intake/Output from previous day: 04/28 0701 - 04/29 0700 In: 1706 [I.V.:1606; IV Piggyback:100] Out: 6761 [Urine:1600; Blood:5] Intake/Output this shift: No intake/output data recorded.  Physical exam:  Abdomen soft nontender no erythema wounds are clean with Dermabond in place  Lab Results: CBC  Recent Labs    02/22/18 0836  WBC 11.2*  HGB 14.8  HCT 43.3  PLT 161   BMET Recent Labs    02/22/18 0836  NA 135  K 4.4  CL 99*  CO2 30  GLUCOSE 117*  BUN 14  CREATININE 0.71  CALCIUM 8.8*   PT/INR No results for input(s): LABPROT, INR in the last 72 hours. ABG No results for input(s): PHART, HCO3 in the last 72 hours.  Invalid input(s): PCO2, PO2  Studies/Results: Ct Abdomen Pelvis Wo Contrast  Result Date: 02/22/2018 CLINICAL DATA:  Right lower quadrant pain and tenderness beginning yesterday. Nausea. Clinical suspicion for appendicitis. EXAM: CT ABDOMEN AND PELVIS WITHOUT CONTRAST TECHNIQUE: Multidetector CT imaging of the abdomen and pelvis was performed following the standard protocol without IV contrast. COMPARISON:  07/31/2015 FINDINGS: Lower chest: No acute findings. Hepatobiliary: No mass visualized on this unenhanced exam. Stable small cyst near junction of right and left lobes. Gallbladder is unremarkable. Pancreas: No mass or inflammatory process visualized on this unenhanced exam. Spleen:  Within normal limits in size. Adrenals/Urinary tract: No evidence of urolithiasis or hydronephrosis. Unremarkable unopacified urinary  bladder. Stomach/Bowel: Findings of acute appendicitis as follows: Appendix: Location: Standard Diameter: 10 mm Appendicolith: Absent Mucosal hyper-enhancement: N/a Extraluminal Gas: Absent Periappendiceal Collection: None Diverticulosis of descending and sigmoid colon again seen, without evidence of diverticulitis. Vascular/Lymphatic: No pathologically enlarged lymph nodes identified. No evidence of abdominal aortic aneurysm. Aortic atherosclerosis. Reproductive: Lower pelvis is obscured by significant artifact from bilateral hip prostheses. Other:  None. Musculoskeletal:  No suspicious bone lesions identified. IMPRESSION: Findings consistent with acute appendicitis, as described above. No evidence of abscess or other complication. Electronically Signed   By: Earle Gell M.D.   On: 02/22/2018 11:58    Anti-infectives: Anti-infectives (From admission, onward)   Start     Dose/Rate Route Frequency Ordered Stop   02/23/18 1400  cefTRIAXone (ROCEPHIN) 2 g in sodium chloride 0.9 % 100 mL IVPB     2 g 200 mL/hr over 30 Minutes Intravenous Every 24 hours 02/22/18 1650 02/24/18 1359   02/23/18 0600  cefTRIAXone (ROCEPHIN) 2 g in sodium chloride 0.9 % 100 mL IVPB     2 g 200 mL/hr over 30 Minutes Intravenous On call to O.R. 02/22/18 1317 02/23/18 0549   02/22/18 1650  metroNIDAZOLE (FLAGYL) IVPB 500 mg     500 mg 100 mL/hr over 60 Minutes Intravenous Every 8 hours 02/22/18 1650 02/23/18 0241   02/22/18 1317  metroNIDAZOLE (FLAGYL) IVPB 500 mg  Status:  Discontinued     500 mg 100 mL/hr over 60 Minutes Intravenous Every 8 hours 02/22/18 1317 02/22/18 1557      Assessment/Plan: s/p Procedure(s): APPENDECTOMY LAPAROSCOPIC   Advancing diet and discharging  later today.  Patient will follow-up with Dr. Rosana Hoes in 10 days.  Florene Glen, MD, FACS  02/23/2018

## 2018-02-24 LAB — SURGICAL PATHOLOGY

## 2018-03-05 ENCOUNTER — Encounter: Payer: Self-pay | Admitting: Surgery

## 2018-03-05 ENCOUNTER — Ambulatory Visit (INDEPENDENT_AMBULATORY_CARE_PROVIDER_SITE_OTHER): Payer: Medicare HMO | Admitting: Surgery

## 2018-03-05 VITALS — BP 124/79 | HR 62 | Temp 97.6°F | Ht 71.0 in | Wt 176.8 lb

## 2018-03-05 DIAGNOSIS — K3580 Unspecified acute appendicitis: Secondary | ICD-10-CM

## 2018-03-05 DIAGNOSIS — Z4889 Encounter for other specified surgical aftercare: Secondary | ICD-10-CM

## 2018-03-05 NOTE — Progress Notes (Signed)
Surgical Clinic Progress/Follow-up Note   HPI:  77 y.o. Male presents to clinic for post-op follow-up evaluation s/p laparoscopic appendectomy for acute appendicitis. Patient reports complete resolution of abdominal pain and has been tolerating regular diet with +flatus and normal BM's, denies N/V, fever/chills, CP, or SOB.  Review of Systems:  Constitutional: denies any other weight loss, fever, chills, or sweats  Eyes: denies any other vision changes, history of eye injury  ENT: denies sore throat, hearing problems  Respiratory: denies shortness of breath, wheezing  Cardiovascular: denies chest pain, palpitations  Gastrointestinal: abdominal pain, N/V, and bowel function as per HPI Musculoskeletal: denies any other joint pains or cramps  Skin: Denies any other rashes or skin discolorations  Neurological: denies any other headache, dizziness, weakness  Psychiatric: denies any other depression, anxiety  All other review of systems: otherwise negative   Vital Signs:  BP 124/79   Pulse 62   Temp 97.6 F (36.4 C) (Oral)   Ht 5\' 11"  (1.803 m)   Wt 176 lb 12.8 oz (80.2 kg)   BMI 24.66 kg/m    Physical Exam:  Constitutional:  -- Normal body habitus  -- Awake, alert, and oriented x3  Eyes:  -- Pupils equally round and reactive to light  -- No scleral icterus  Ear, nose, throat:  -- No jugular venous distension  -- No nasal drainage, bleeding Pulmonary:  -- No crackles -- Equal breath sounds bilaterally -- Breathing non-labored at rest Cardiovascular:  -- S1, S2 present  -- No pericardial rubs  Gastrointestinal:  -- Soft, nontender, non-distended, no guarding/rebound -- Incisions well-approximated without surrounding erythema or drainage -- No abdominal masses appreciated, pulsatile or otherwise  Musculoskeletal / Integumentary:  -- Wounds or skin discoloration: None appreciated except post-surgical wounds as described above (GI) -- Extremities: B/L UE and LE FROM,  hands and feet warm, no edema  Neurologic:  -- Motor function: intact and symmetric  -- Sensation: intact and symmetric   Assessment:  77 y.o. yo Male with a problem list including...  Patient Active Problem List   Diagnosis Date Noted  . Acute appendicitis 02/22/2018  . Acquired hypothyroidism 11/07/2016  . Chronic midline low back pain without sciatica 11/02/2015  . Diverticulosis of large intestine without hemorrhage 11/02/2015  . Low serum testosterone level 11/02/2015  . OP (osteoporosis) 11/02/2015  . Gastric ulcer 07/07/2014  . IDA (iron deficiency anemia) 07/07/2014  . S/P total hip arthroplasty 07/27/2013    presents to clinic for post-op follow-up evaluation, doing well s/p laparoscopic appendectomy for acute appendicitis.  Plan:              - advance diet as tolerated              - okay to submerge incisions under water (baths, swimming) prn             - gradually resume all activities without restrictions over next 2 weeks             - apply sunblock particularly to incisions with sun exposure to reduce pigmentation of scars             - return to clinic as needed, instructed to call office if any questions or concerns   All of the above recommendations were discussed with the patient and patient's family, and all of patient's and family's questions were answered to their expressed satisfaction.  -- Marilynne Drivers Rosana Hoes, MD, [place]: [facility] - Partnering for  exceptional care. Office: [phone]

## 2018-03-05 NOTE — Patient Instructions (Signed)
Have fun at [employer].   Please call our office if you have questions or concerns.

## 2018-03-11 ENCOUNTER — Encounter: Payer: Self-pay | Admitting: Surgery

## 2018-04-23 DIAGNOSIS — G8929 Other chronic pain: Secondary | ICD-10-CM | POA: Diagnosis not present

## 2018-04-23 DIAGNOSIS — M545 Low back pain: Secondary | ICD-10-CM | POA: Diagnosis not present

## 2018-04-23 DIAGNOSIS — J218 Acute bronchiolitis due to other specified organisms: Secondary | ICD-10-CM | POA: Diagnosis not present

## 2018-04-23 DIAGNOSIS — J209 Acute bronchitis, unspecified: Secondary | ICD-10-CM | POA: Diagnosis not present

## 2018-05-07 DIAGNOSIS — R1013 Epigastric pain: Secondary | ICD-10-CM | POA: Diagnosis not present

## 2018-05-07 DIAGNOSIS — K5730 Diverticulosis of large intestine without perforation or abscess without bleeding: Secondary | ICD-10-CM | POA: Diagnosis not present

## 2018-05-07 DIAGNOSIS — R7989 Other specified abnormal findings of blood chemistry: Secondary | ICD-10-CM | POA: Diagnosis not present

## 2018-05-07 DIAGNOSIS — Z125 Encounter for screening for malignant neoplasm of prostate: Secondary | ICD-10-CM | POA: Diagnosis not present

## 2018-05-07 DIAGNOSIS — E039 Hypothyroidism, unspecified: Secondary | ICD-10-CM | POA: Diagnosis not present

## 2018-05-07 DIAGNOSIS — G8929 Other chronic pain: Secondary | ICD-10-CM | POA: Diagnosis not present

## 2018-05-07 DIAGNOSIS — M545 Low back pain: Secondary | ICD-10-CM | POA: Diagnosis not present

## 2018-05-07 DIAGNOSIS — M810 Age-related osteoporosis without current pathological fracture: Secondary | ICD-10-CM | POA: Diagnosis not present

## 2018-05-14 DIAGNOSIS — Z0000 Encounter for general adult medical examination without abnormal findings: Secondary | ICD-10-CM | POA: Diagnosis not present

## 2018-05-14 DIAGNOSIS — R05 Cough: Secondary | ICD-10-CM | POA: Diagnosis not present

## 2018-05-14 DIAGNOSIS — M545 Low back pain: Secondary | ICD-10-CM | POA: Diagnosis not present

## 2018-05-14 DIAGNOSIS — M810 Age-related osteoporosis without current pathological fracture: Secondary | ICD-10-CM | POA: Diagnosis not present

## 2018-05-14 DIAGNOSIS — G8929 Other chronic pain: Secondary | ICD-10-CM | POA: Diagnosis not present

## 2018-05-14 DIAGNOSIS — Z87891 Personal history of nicotine dependence: Secondary | ICD-10-CM | POA: Diagnosis not present

## 2018-05-14 DIAGNOSIS — E039 Hypothyroidism, unspecified: Secondary | ICD-10-CM | POA: Diagnosis not present

## 2018-05-14 DIAGNOSIS — R7989 Other specified abnormal findings of blood chemistry: Secondary | ICD-10-CM | POA: Diagnosis not present

## 2018-10-22 DIAGNOSIS — J22 Unspecified acute lower respiratory infection: Secondary | ICD-10-CM | POA: Diagnosis not present

## 2018-10-22 DIAGNOSIS — R05 Cough: Secondary | ICD-10-CM | POA: Diagnosis not present

## 2018-10-23 DIAGNOSIS — R05 Cough: Secondary | ICD-10-CM | POA: Diagnosis not present

## 2018-11-12 DIAGNOSIS — M545 Low back pain: Secondary | ICD-10-CM | POA: Diagnosis not present

## 2018-11-12 DIAGNOSIS — M810 Age-related osteoporosis without current pathological fracture: Secondary | ICD-10-CM | POA: Diagnosis not present

## 2018-11-12 DIAGNOSIS — R7989 Other specified abnormal findings of blood chemistry: Secondary | ICD-10-CM | POA: Diagnosis not present

## 2018-11-12 DIAGNOSIS — E039 Hypothyroidism, unspecified: Secondary | ICD-10-CM | POA: Diagnosis not present

## 2018-11-12 DIAGNOSIS — G8929 Other chronic pain: Secondary | ICD-10-CM | POA: Diagnosis not present

## 2018-11-12 DIAGNOSIS — R05 Cough: Secondary | ICD-10-CM | POA: Diagnosis not present

## 2018-11-19 DIAGNOSIS — R7989 Other specified abnormal findings of blood chemistry: Secondary | ICD-10-CM | POA: Diagnosis not present

## 2018-11-19 DIAGNOSIS — E039 Hypothyroidism, unspecified: Secondary | ICD-10-CM | POA: Diagnosis not present

## 2018-11-19 DIAGNOSIS — R918 Other nonspecific abnormal finding of lung field: Secondary | ICD-10-CM | POA: Diagnosis not present

## 2018-11-19 DIAGNOSIS — R911 Solitary pulmonary nodule: Secondary | ICD-10-CM | POA: Diagnosis not present

## 2018-11-19 DIAGNOSIS — Z0000 Encounter for general adult medical examination without abnormal findings: Secondary | ICD-10-CM | POA: Diagnosis not present

## 2018-11-19 DIAGNOSIS — M810 Age-related osteoporosis without current pathological fracture: Secondary | ICD-10-CM | POA: Diagnosis not present

## 2018-11-23 DIAGNOSIS — J90 Pleural effusion, not elsewhere classified: Secondary | ICD-10-CM | POA: Diagnosis not present

## 2018-11-23 DIAGNOSIS — R911 Solitary pulmonary nodule: Secondary | ICD-10-CM | POA: Diagnosis not present

## 2018-11-24 DIAGNOSIS — Z86010 Personal history of colonic polyps: Secondary | ICD-10-CM | POA: Diagnosis not present

## 2018-11-24 DIAGNOSIS — M8588 Other specified disorders of bone density and structure, other site: Secondary | ICD-10-CM | POA: Diagnosis not present

## 2018-12-21 DIAGNOSIS — H52223 Regular astigmatism, bilateral: Secondary | ICD-10-CM | POA: Diagnosis not present

## 2018-12-21 DIAGNOSIS — Z9841 Cataract extraction status, right eye: Secondary | ICD-10-CM | POA: Diagnosis not present

## 2018-12-21 DIAGNOSIS — H353131 Nonexudative age-related macular degeneration, bilateral, early dry stage: Secondary | ICD-10-CM | POA: Diagnosis not present

## 2018-12-21 DIAGNOSIS — Z9842 Cataract extraction status, left eye: Secondary | ICD-10-CM | POA: Diagnosis not present

## 2018-12-21 DIAGNOSIS — H5203 Hypermetropia, bilateral: Secondary | ICD-10-CM | POA: Diagnosis not present

## 2019-01-14 ENCOUNTER — Encounter: Admission: RE | Payer: Self-pay | Source: Home / Self Care

## 2019-01-14 ENCOUNTER — Ambulatory Visit: Admission: RE | Admit: 2019-01-14 | Payer: Medicare HMO | Source: Home / Self Care | Admitting: Gastroenterology

## 2019-01-14 SURGERY — COLONOSCOPY WITH PROPOFOL
Anesthesia: General

## 2019-04-28 DIAGNOSIS — Z86010 Personal history of colonic polyps: Secondary | ICD-10-CM | POA: Diagnosis not present

## 2019-04-29 ENCOUNTER — Other Ambulatory Visit: Payer: Self-pay

## 2019-04-29 ENCOUNTER — Other Ambulatory Visit
Admission: RE | Admit: 2019-04-29 | Discharge: 2019-04-29 | Disposition: A | Discharge status: Home / Self Care | Payer: Medicare HMO | Source: Ambulatory Visit | Attending: Gastroenterology | Admitting: Gastroenterology

## 2019-04-29 DIAGNOSIS — Z1159 Encounter for screening for other viral diseases: Secondary | ICD-10-CM | POA: Diagnosis not present

## 2019-04-29 DIAGNOSIS — Z01812 Encounter for preprocedural laboratory examination: Secondary | ICD-10-CM | POA: Diagnosis not present

## 2019-04-30 LAB — SARS CORONAVIRUS 2 (TAT 6-24 HRS): SARS Coronavirus 2: NEGATIVE

## 2019-05-03 ENCOUNTER — Ambulatory Visit: Payer: Medicare HMO | Admitting: Certified Registered Nurse Anesthetist

## 2019-05-03 ENCOUNTER — Ambulatory Visit
Admission: RE | Admit: 2019-05-03 | Discharge: 2019-05-03 | Disposition: A | Discharge status: Home / Self Care | Payer: Medicare HMO | Attending: Gastroenterology | Admitting: Gastroenterology

## 2019-05-03 ENCOUNTER — Other Ambulatory Visit: Payer: Self-pay

## 2019-05-03 ENCOUNTER — Encounter
Admission: RE | Disposition: A | Discharge status: Home / Self Care | Payer: Self-pay | Source: Home / Self Care | Attending: Gastroenterology

## 2019-05-03 DIAGNOSIS — Z79899 Other long term (current) drug therapy: Secondary | ICD-10-CM | POA: Insufficient documentation

## 2019-05-03 DIAGNOSIS — Z79890 Hormone replacement therapy (postmenopausal): Secondary | ICD-10-CM | POA: Diagnosis not present

## 2019-05-03 DIAGNOSIS — Z1211 Encounter for screening for malignant neoplasm of colon: Secondary | ICD-10-CM | POA: Diagnosis not present

## 2019-05-03 DIAGNOSIS — Z87891 Personal history of nicotine dependence: Secondary | ICD-10-CM | POA: Diagnosis not present

## 2019-05-03 DIAGNOSIS — K648 Other hemorrhoids: Secondary | ICD-10-CM | POA: Diagnosis not present

## 2019-05-03 DIAGNOSIS — K5730 Diverticulosis of large intestine without perforation or abscess without bleeding: Secondary | ICD-10-CM | POA: Insufficient documentation

## 2019-05-03 DIAGNOSIS — Z86010 Personal history of colonic polyps: Secondary | ICD-10-CM | POA: Diagnosis not present

## 2019-05-03 DIAGNOSIS — E039 Hypothyroidism, unspecified: Secondary | ICD-10-CM | POA: Insufficient documentation

## 2019-05-03 DIAGNOSIS — Z96643 Presence of artificial hip joint, bilateral: Secondary | ICD-10-CM | POA: Insufficient documentation

## 2019-05-03 DIAGNOSIS — Q438 Other specified congenital malformations of intestine: Secondary | ICD-10-CM | POA: Insufficient documentation

## 2019-05-03 DIAGNOSIS — Z7982 Long term (current) use of aspirin: Secondary | ICD-10-CM | POA: Insufficient documentation

## 2019-05-03 HISTORY — PX: COLONOSCOPY WITH PROPOFOL: SHX5780

## 2019-05-03 SURGERY — COLONOSCOPY WITH PROPOFOL
Anesthesia: General

## 2019-05-03 MED ORDER — PROPOFOL 10 MG/ML IV BOLUS
INTRAVENOUS | Status: DC | PRN
  Administered 2019-05-03: 50 mg via INTRAVENOUS

## 2019-05-03 MED ORDER — SODIUM CHLORIDE 0.9 % IV SOLN
INTRAVENOUS | Status: DC
  Administered 2019-05-03: 11:00:00 via INTRAVENOUS

## 2019-05-03 MED ORDER — PROPOFOL 500 MG/50ML IV EMUL
INTRAVENOUS | Status: DC | PRN
  Administered 2019-05-03: 100 ug/kg/min via INTRAVENOUS

## 2019-05-03 MED ORDER — PHENYLEPHRINE HCL (PRESSORS) 10 MG/ML IV SOLN
INTRAVENOUS | Status: DC | PRN
  Administered 2019-05-03: 100 ug via INTRAVENOUS

## 2019-05-03 MED ORDER — SODIUM CHLORIDE 0.9 % IV SOLN
INTRAVENOUS | Status: DC | PRN
  Administered 2019-05-03: 11:00:00 via INTRAVENOUS

## 2019-05-03 MED ORDER — LIDOCAINE HCL (CARDIAC) PF 100 MG/5ML IV SOSY
PREFILLED_SYRINGE | INTRAVENOUS | Status: DC | PRN
  Administered 2019-05-03: 50 mg via INTRAVENOUS

## 2019-05-03 NOTE — Anesthesia Postprocedure Evaluation (Signed)
Anesthesia Post Note  Patient: Thomas Palmer.  Procedure(s) Performed: COLONOSCOPY WITH PROPOFOL (N/A )  Patient location during evaluation: Endoscopy Anesthesia Type: General Level of consciousness: awake and alert Pain management: pain level controlled Vital Signs Assessment: post-procedure vital signs reviewed and stable Respiratory status: spontaneous breathing and respiratory function stable Cardiovascular status: stable Anesthetic complications: no     Last Vitals:  Vitals:   05/03/19 1054 05/03/19 1158  BP: (!) 143/88 96/66  Pulse: 60 61  Resp: 16 15  Temp: (!) 36.1 C (!) 36.1 C  SpO2: 97% 96%    Last Pain:  Vitals:   05/03/19 1158  TempSrc: Tympanic  PainSc: 0-No pain                 Zailey Audia,[place]

## 2019-05-03 NOTE — Anesthesia Post-op Follow-up Note (Signed)
Anesthesia QCDR form completed.        

## 2019-05-03 NOTE — Transfer of Care (Signed)
Immediate Anesthesia Transfer of Care Note  Patient: Thomas Palmer.  Procedure(s) Performed: COLONOSCOPY WITH PROPOFOL (N/A )  Patient Location: PACU  Anesthesia Type:MAC  Level of Consciousness: awake, alert  and oriented  Airway & Oxygen Therapy: Patient Spontanous Breathing  Post-op Assessment: Report given to RN and Post -op Vital signs reviewed and stable  Post vital signs: Reviewed and stable  Last Vitals:  Vitals Value Taken Time  BP 96/66 05/03/19 1158  Temp    Pulse 60 05/03/19 1158  Resp 15 05/03/19 1158  SpO2 96 % 05/03/19 1158  Vitals shown include unvalidated device data.  Last Pain:  Vitals:   05/03/19 1158  TempSrc:   PainSc: 0-No pain         Complications: No apparent anesthesia complications

## 2019-05-03 NOTE — Anesthesia Preprocedure Evaluation (Signed)
Anesthesia Evaluation  Patient identified by MRN, date of birth, ID band Patient awake    Reviewed: Allergy & Precautions, H&P , NPO status , Patient's Chart, lab work & pertinent test results, reviewed documented beta blocker date and time   Airway Mallampati: II   Neck ROM: full    Dental  (+) Poor Dentition   Pulmonary pneumonia, resolved, former smoker,    Pulmonary exam normal        Cardiovascular Exercise Tolerance: Good negative cardio ROS Normal cardiovascular exam Rhythm:regular Rate:Normal     Neuro/Psych PSYCHIATRIC DISORDERS Depression negative neurological ROS     GI/Hepatic Neg liver ROS, hiatal hernia, PUD, GERD  ,  Endo/Other  Hypothyroidism   Renal/GU negative Renal ROS  negative genitourinary   Musculoskeletal   Abdominal   Peds  Hematology  (+) Blood dyscrasia, anemia ,   Anesthesia Other Findings Past Medical History: No date: Arthritis     Comment:  "joints in hips and fingers; shoulders too" (07/13/2013) No date: Chronic lower back pain No date: Depression No date: H/O hiatal hernia No date: Hypothyroidism 08/2011: Pneumonia Past Surgical History: No date: BACK SURGERY     Comment:  kyphoplasty ~ 2009: CARDIAC CATHETERIZATION ~ 2012: FIXATION KYPHOPLASTY LUMBAR SPINE     Comment:  "L1" (07/13/2013) No date: JOINT REPLACEMENT 02/22/2018: LAPAROSCOPIC APPENDECTOMY; N/A     Comment:  Procedure: APPENDECTOMY LAPAROSCOPIC;  Surgeon: Vickie Epley, MD;  Location: [facility] ORS;  Service: General;                Laterality: N/A; 07/12/2013: TOTAL HIP ARTHROPLASTY; Left     Comment:  Procedure: TOTAL HIP ARTHROPLASTY;  Surgeon: Vickey Huger, MD;  Location: [place];  Service: Orthopedics;                Laterality: Left; 05/17/2002: TOTAL HIP ARTHROPLASTY; Right BMI    Body Mass Index: 26.50 kg/m     Reproductive/Obstetrics negative OB ROS                              Anesthesia Physical Anesthesia Plan  ASA: II  Anesthesia Plan: General   Post-op Pain Management:    Induction:   PONV Risk Score and Plan:   Airway Management Planned:   Additional Equipment:   Intra-op Plan:   Post-operative Plan:   Informed Consent: I have reviewed the patients History and Physical, chart, labs and discussed the procedure including the risks, benefits and alternatives for the proposed anesthesia with the patient or authorized representative who has indicated his/her understanding and acceptance.     Dental Advisory Given  Plan Discussed with: CRNA  Anesthesia Plan Comments:         Anesthesia Quick Evaluation

## 2019-05-03 NOTE — H&P (Signed)
Outpatient short stay form Pre-procedure 05/03/2019 11:18 AM Thomas Sails MD  Primary Physician: Dr Tracie Harrier  Reason for visit: Colonoscopy  History of present illness: Patient is a 78 year old male presenting today for colonoscopy in regards to his personal history of colon polyps.  His last colonoscopy was about 5 years ago.  Following that procedure several months later he did have a hiatal hernia repair.  Tolerated prep well.  He takes a 81 mg aspirin daily but that has been held for several weeks.  He takes no other aspirin products or blood thinning agent.    Current Facility-Administered Medications:  .  0.9 %  sodium chloride infusion, , Intravenous, Continuous, Thomas Sails, MD, Last Rate: 20 mL/hr at 05/03/19 1113  Medications Prior to Admission  Medication Sig Dispense Refill Last Dose  . Ascorbic Acid (VITAMIN C) 1000 MG tablet Take 1,000 mg by mouth daily.   Past Week at Unknown time  . aspirin EC 81 MG tablet Take 81 mg by mouth daily.    Past Month at Unknown time  . Calcium Ascorbate 500 MG TABS    Past Week at Unknown time  . levothyroxine (SYNTHROID, LEVOTHROID) 200 MCG tablet Take by mouth daily before breakfast.    05/02/2019 at Unknown time  . Lutein 20 MG CAPS Take 1 capsule by mouth daily.   Past Week at Unknown time  . Multiple Vitamins-Minerals (MULTIVITAMIN WITH MINERALS) tablet Take 1 tablet by mouth daily.   Past Week at Unknown time  . vitamin B-12 (CYANOCOBALAMIN) 100 MCG tablet Take 100 mcg by mouth daily.    Past Week at Unknown time  . testosterone (ANDROGEL) 50 MG/5GM (1%) GEL Please compound        Allergies  Allergen Reactions  . Contrast Media [Iodinated Diagnostic Agents] Nausea And Vomiting     Past Medical History:  Diagnosis Date  . Arthritis    "joints in hips and fingers; shoulders too" (07/13/2013)  . Chronic lower back pain   . Depression   . H/O hiatal hernia   . Hypothyroidism   . Pneumonia 08/2011    Review of  systems:      Physical Exam    Heart and lungs: Regular rate and rhythm without rub or gallop, lungs are bilaterally clear.    HEENT: Normocephalic atraumatic eyes are anicteric    Other:    Pertinant exam for procedure: Soft nontender nondistended bowel sounds positive normoactive.  Of note there is a thinning of the abdominal wall in the mid epigastrium along the area of his surgical scar.  It feels like there was a plate piece of mesh placed in the upper portion of this area.  Nontender.    Planned proceedures: Anoscopy and indicated procedures. I have discussed the risks benefits and complications of procedures to include not limited to bleeding, infection, perforation and the risk of sedation and the patient wishes to proceed.    Thomas Sails, MD Gastroenterology 05/03/2019  11:18 AM

## 2019-05-03 NOTE — Op Note (Signed)
[facility] Gastroenterology Patient Name: Thomas Palmer Procedure Date: 05/03/2019 10:52 AM MRN: [id number] Account #: [id number] Date of Birth: [birth year] Admit Type: Outpatient Age: 78 Room: [facility] ENDO ROOM 3 Gender: Male Note Status: Finalized Procedure:            Colonoscopy Indications:          Personal history of colonic polyps Providers:            Lollie Sails, MD Medicines:            Monitored Anesthesia Care Complications:        No immediate complications. Procedure:            Pre-Anesthesia Assessment:                       - ASA Grade Assessment: II - A patient with mild                        systemic disease.                       After obtaining informed consent, the colonoscope was                        passed under direct vision. Throughout the procedure,                        the patient's blood pressure, pulse, and oxygen                        saturations were monitored continuously. The                        Colonoscope was introduced through the anus and                        advanced to the the cecum, identified by appendiceal                        orifice and ileocecal valve. The colonoscopy was                        performed with moderate difficulty due to multiple                        diverticula in the colon and a redundant colon.                        Successful completion of the procedure was aided by                        changing the patient to a supine position and using                        manual pressure. The patient tolerated the procedure                        well. The quality of the bowel preparation was good. Findings:      Many small and large-mouthed diverticula were found in the sigmoid       colon, descending colon, transverse colon  and ascending colon.      Non-bleeding internal hemorrhoids were found during retroflexion. The       hemorrhoids were small.      The digital rectal exam was  normal. Impression:           - Diverticulosis in the sigmoid colon, in the                        descending colon, in the transverse colon and in the                        ascending colon.                       - Non-bleeding internal hemorrhoids.                       - No specimens collected. Recommendation:       - Discharge patient to home.                       - Soft diet today, then advance as tolerated to advance                        diet as tolerated. Procedure Code(s):    --- Professional ---                       [phone], Colonoscopy, flexible; diagnostic, including                        collection of specimen(s) by brushing or washing, when                        performed (separate procedure) Diagnosis Code(s):    --- Professional ---                       K64.8, Other hemorrhoids                       Z86.010, Personal history of colonic polyps                       K57.30, Diverticulosis of large intestine without                        perforation or abscess without bleeding CPT copyright 2019 American [employer]. All rights reserved. The codes documented in this report are preliminary and upon coder review may  be revised to meet current compliance requirements. Lollie Sails, MD 05/03/2019 11:58:46 AM This report has been signed electronically. Number of Addenda: 0 Note Initiated On: 05/03/2019 10:52 AM Scope Withdrawal Time: 0 hours 7 minutes 34 seconds  Total Procedure Duration: 0 hours 28 minutes 19 seconds       [facility]

## 2019-05-04 ENCOUNTER — Encounter: Payer: Self-pay | Admitting: Gastroenterology

## 2019-05-06 DIAGNOSIS — M47816 Spondylosis without myelopathy or radiculopathy, lumbar region: Secondary | ICD-10-CM | POA: Diagnosis not present

## 2019-05-06 DIAGNOSIS — R030 Elevated blood-pressure reading, without diagnosis of hypertension: Secondary | ICD-10-CM | POA: Diagnosis not present

## 2019-05-06 DIAGNOSIS — Z6826 Body mass index (BMI) 26.0-26.9, adult: Secondary | ICD-10-CM | POA: Diagnosis not present

## 2019-05-13 DIAGNOSIS — R7989 Other specified abnormal findings of blood chemistry: Secondary | ICD-10-CM | POA: Diagnosis not present

## 2019-05-13 DIAGNOSIS — E039 Hypothyroidism, unspecified: Secondary | ICD-10-CM | POA: Diagnosis not present

## 2019-05-13 DIAGNOSIS — Z125 Encounter for screening for malignant neoplasm of prostate: Secondary | ICD-10-CM | POA: Diagnosis not present

## 2019-05-13 DIAGNOSIS — R911 Solitary pulmonary nodule: Secondary | ICD-10-CM | POA: Diagnosis not present

## 2019-05-20 DIAGNOSIS — M810 Age-related osteoporosis without current pathological fracture: Secondary | ICD-10-CM | POA: Diagnosis not present

## 2019-05-20 DIAGNOSIS — E039 Hypothyroidism, unspecified: Secondary | ICD-10-CM | POA: Diagnosis not present

## 2019-05-20 DIAGNOSIS — Z23 Encounter for immunization: Secondary | ICD-10-CM | POA: Diagnosis not present

## 2019-05-20 DIAGNOSIS — D751 Secondary polycythemia: Secondary | ICD-10-CM | POA: Diagnosis not present

## 2019-05-20 DIAGNOSIS — M545 Low back pain: Secondary | ICD-10-CM | POA: Diagnosis not present

## 2019-05-20 DIAGNOSIS — R7989 Other specified abnormal findings of blood chemistry: Secondary | ICD-10-CM | POA: Diagnosis not present

## 2019-05-20 DIAGNOSIS — K432 Incisional hernia without obstruction or gangrene: Secondary | ICD-10-CM | POA: Diagnosis not present

## 2019-05-20 DIAGNOSIS — G8929 Other chronic pain: Secondary | ICD-10-CM | POA: Diagnosis not present

## 2019-06-14 DIAGNOSIS — M47816 Spondylosis without myelopathy or radiculopathy, lumbar region: Secondary | ICD-10-CM | POA: Diagnosis not present

## 2019-07-06 DIAGNOSIS — Z6826 Body mass index (BMI) 26.0-26.9, adult: Secondary | ICD-10-CM | POA: Diagnosis not present

## 2019-07-06 DIAGNOSIS — R030 Elevated blood-pressure reading, without diagnosis of hypertension: Secondary | ICD-10-CM | POA: Diagnosis not present

## 2019-09-16 DIAGNOSIS — M47816 Spondylosis without myelopathy or radiculopathy, lumbar region: Secondary | ICD-10-CM | POA: Diagnosis not present

## 2019-10-14 DIAGNOSIS — M47816 Spondylosis without myelopathy or radiculopathy, lumbar region: Secondary | ICD-10-CM | POA: Diagnosis not present

## 2019-10-29 HISTORY — PX: ACHILLES TENDON REPAIR: SUR1153

## 2019-11-06 DIAGNOSIS — R0602 Shortness of breath: Secondary | ICD-10-CM | POA: Diagnosis not present

## 2019-11-06 DIAGNOSIS — U071 COVID-19: Secondary | ICD-10-CM | POA: Diagnosis not present

## 2019-11-17 DIAGNOSIS — Z125 Encounter for screening for malignant neoplasm of prostate: Secondary | ICD-10-CM | POA: Diagnosis not present

## 2019-11-17 DIAGNOSIS — K432 Incisional hernia without obstruction or gangrene: Secondary | ICD-10-CM | POA: Diagnosis not present

## 2019-11-17 DIAGNOSIS — M810 Age-related osteoporosis without current pathological fracture: Secondary | ICD-10-CM | POA: Diagnosis not present

## 2019-11-17 DIAGNOSIS — R7989 Other specified abnormal findings of blood chemistry: Secondary | ICD-10-CM | POA: Diagnosis not present

## 2019-11-17 DIAGNOSIS — G8929 Other chronic pain: Secondary | ICD-10-CM | POA: Diagnosis not present

## 2019-11-17 DIAGNOSIS — E039 Hypothyroidism, unspecified: Secondary | ICD-10-CM | POA: Diagnosis not present

## 2019-11-17 DIAGNOSIS — M545 Low back pain: Secondary | ICD-10-CM | POA: Diagnosis not present

## 2019-11-17 DIAGNOSIS — D751 Secondary polycythemia: Secondary | ICD-10-CM | POA: Diagnosis not present

## 2019-11-23 DIAGNOSIS — G8929 Other chronic pain: Secondary | ICD-10-CM | POA: Diagnosis not present

## 2019-11-23 DIAGNOSIS — Z79899 Other long term (current) drug therapy: Secondary | ICD-10-CM | POA: Diagnosis not present

## 2019-11-23 DIAGNOSIS — Z0000 Encounter for general adult medical examination without abnormal findings: Secondary | ICD-10-CM | POA: Diagnosis not present

## 2019-11-23 DIAGNOSIS — K432 Incisional hernia without obstruction or gangrene: Secondary | ICD-10-CM | POA: Diagnosis not present

## 2019-11-23 DIAGNOSIS — F329 Major depressive disorder, single episode, unspecified: Secondary | ICD-10-CM | POA: Diagnosis not present

## 2019-11-23 DIAGNOSIS — D751 Secondary polycythemia: Secondary | ICD-10-CM | POA: Diagnosis not present

## 2019-11-23 DIAGNOSIS — E039 Hypothyroidism, unspecified: Secondary | ICD-10-CM | POA: Diagnosis not present

## 2019-11-23 DIAGNOSIS — M545 Low back pain: Secondary | ICD-10-CM | POA: Diagnosis not present

## 2019-11-23 DIAGNOSIS — K5900 Constipation, unspecified: Secondary | ICD-10-CM | POA: Diagnosis not present

## 2019-12-22 DIAGNOSIS — H52223 Regular astigmatism, bilateral: Secondary | ICD-10-CM | POA: Diagnosis not present

## 2019-12-22 DIAGNOSIS — H5203 Hypermetropia, bilateral: Secondary | ICD-10-CM | POA: Diagnosis not present

## 2019-12-22 DIAGNOSIS — Z9841 Cataract extraction status, right eye: Secondary | ICD-10-CM | POA: Diagnosis not present

## 2019-12-22 DIAGNOSIS — H353131 Nonexudative age-related macular degeneration, bilateral, early dry stage: Secondary | ICD-10-CM | POA: Diagnosis not present

## 2019-12-22 DIAGNOSIS — Z9842 Cataract extraction status, left eye: Secondary | ICD-10-CM | POA: Diagnosis not present

## 2020-01-04 DIAGNOSIS — Z96643 Presence of artificial hip joint, bilateral: Secondary | ICD-10-CM | POA: Diagnosis not present

## 2020-01-04 DIAGNOSIS — M1711 Unilateral primary osteoarthritis, right knee: Secondary | ICD-10-CM | POA: Diagnosis not present

## 2020-01-10 DIAGNOSIS — M47816 Spondylosis without myelopathy or radiculopathy, lumbar region: Secondary | ICD-10-CM | POA: Diagnosis not present

## 2020-01-14 ENCOUNTER — Other Ambulatory Visit: Payer: Self-pay | Admitting: Orthopedic Surgery

## 2020-01-14 DIAGNOSIS — M25561 Pain in right knee: Secondary | ICD-10-CM

## 2020-01-31 DIAGNOSIS — M47816 Spondylosis without myelopathy or radiculopathy, lumbar region: Secondary | ICD-10-CM | POA: Diagnosis not present

## 2020-01-31 DIAGNOSIS — M48062 Spinal stenosis, lumbar region with neurogenic claudication: Secondary | ICD-10-CM | POA: Diagnosis not present

## 2020-02-10 ENCOUNTER — Other Ambulatory Visit: Payer: Self-pay

## 2020-02-10 ENCOUNTER — Ambulatory Visit
Admission: RE | Admit: 2020-02-10 | Discharge: 2020-02-10 | Disposition: A | Discharge status: Home / Self Care | Payer: Medicare HMO | Source: Ambulatory Visit | Attending: Orthopedic Surgery | Admitting: Orthopedic Surgery

## 2020-02-10 DIAGNOSIS — M25561 Pain in right knee: Secondary | ICD-10-CM

## 2020-02-22 DIAGNOSIS — M25561 Pain in right knee: Secondary | ICD-10-CM | POA: Diagnosis not present

## 2020-02-22 DIAGNOSIS — S86012A Strain of left Achilles tendon, initial encounter: Secondary | ICD-10-CM | POA: Diagnosis not present

## 2020-02-22 DIAGNOSIS — G8929 Other chronic pain: Secondary | ICD-10-CM | POA: Diagnosis not present

## 2020-02-29 ENCOUNTER — Other Ambulatory Visit: Payer: Self-pay | Admitting: Orthopedic Surgery

## 2020-02-29 DIAGNOSIS — M25572 Pain in left ankle and joints of left foot: Secondary | ICD-10-CM

## 2020-03-25 ENCOUNTER — Other Ambulatory Visit: Payer: Self-pay

## 2020-03-25 ENCOUNTER — Ambulatory Visit
Admission: RE | Admit: 2020-03-25 | Discharge: 2020-03-25 | Disposition: A | Discharge status: Home / Self Care | Payer: Medicare HMO | Source: Ambulatory Visit | Attending: Orthopedic Surgery | Admitting: Orthopedic Surgery

## 2020-03-25 DIAGNOSIS — M25572 Pain in left ankle and joints of left foot: Secondary | ICD-10-CM

## 2020-03-25 DIAGNOSIS — S86012A Strain of left Achilles tendon, initial encounter: Secondary | ICD-10-CM | POA: Diagnosis not present

## 2020-04-04 DIAGNOSIS — S86012A Strain of left Achilles tendon, initial encounter: Secondary | ICD-10-CM | POA: Diagnosis not present

## 2020-04-06 DIAGNOSIS — M9262 Juvenile osteochondrosis of tarsus, left ankle: Secondary | ICD-10-CM | POA: Diagnosis not present

## 2020-04-06 DIAGNOSIS — S86012D Strain of left Achilles tendon, subsequent encounter: Secondary | ICD-10-CM | POA: Diagnosis not present

## 2020-04-06 DIAGNOSIS — S86012A Strain of left Achilles tendon, initial encounter: Secondary | ICD-10-CM | POA: Diagnosis not present

## 2020-04-06 DIAGNOSIS — M21862 Other specified acquired deformities of left lower leg: Secondary | ICD-10-CM | POA: Diagnosis not present

## 2020-04-24 DIAGNOSIS — Z20822 Contact with and (suspected) exposure to covid-19: Secondary | ICD-10-CM | POA: Diagnosis not present

## 2020-04-28 DIAGNOSIS — M21862 Other specified acquired deformities of left lower leg: Secondary | ICD-10-CM | POA: Diagnosis not present

## 2020-04-28 DIAGNOSIS — S86012A Strain of left Achilles tendon, initial encounter: Secondary | ICD-10-CM | POA: Diagnosis not present

## 2020-04-28 DIAGNOSIS — M9262 Juvenile osteochondrosis of tarsus, left ankle: Secondary | ICD-10-CM | POA: Diagnosis not present

## 2020-04-28 DIAGNOSIS — Z87891 Personal history of nicotine dependence: Secondary | ICD-10-CM | POA: Diagnosis not present

## 2020-04-28 DIAGNOSIS — E039 Hypothyroidism, unspecified: Secondary | ICD-10-CM | POA: Diagnosis not present

## 2020-04-28 DIAGNOSIS — G8918 Other acute postprocedural pain: Secondary | ICD-10-CM | POA: Diagnosis not present

## 2020-05-17 DIAGNOSIS — Z4802 Encounter for removal of sutures: Secondary | ICD-10-CM | POA: Diagnosis not present

## 2020-05-17 DIAGNOSIS — S86012D Strain of left Achilles tendon, subsequent encounter: Secondary | ICD-10-CM | POA: Diagnosis not present

## 2020-06-07 DIAGNOSIS — R7989 Other specified abnormal findings of blood chemistry: Secondary | ICD-10-CM | POA: Diagnosis not present

## 2020-06-07 DIAGNOSIS — M545 Low back pain: Secondary | ICD-10-CM | POA: Diagnosis not present

## 2020-06-07 DIAGNOSIS — E039 Hypothyroidism, unspecified: Secondary | ICD-10-CM | POA: Diagnosis not present

## 2020-06-07 DIAGNOSIS — M810 Age-related osteoporosis without current pathological fracture: Secondary | ICD-10-CM | POA: Diagnosis not present

## 2020-06-07 DIAGNOSIS — G8929 Other chronic pain: Secondary | ICD-10-CM | POA: Diagnosis not present

## 2020-06-08 DIAGNOSIS — Z4789 Encounter for other orthopedic aftercare: Secondary | ICD-10-CM | POA: Diagnosis not present

## 2020-06-15 DIAGNOSIS — G8929 Other chronic pain: Secondary | ICD-10-CM | POA: Diagnosis not present

## 2020-06-15 DIAGNOSIS — M545 Low back pain: Secondary | ICD-10-CM | POA: Diagnosis not present

## 2020-06-15 DIAGNOSIS — E039 Hypothyroidism, unspecified: Secondary | ICD-10-CM | POA: Diagnosis not present

## 2020-06-15 DIAGNOSIS — L989 Disorder of the skin and subcutaneous tissue, unspecified: Secondary | ICD-10-CM | POA: Diagnosis not present

## 2020-06-15 DIAGNOSIS — N529 Male erectile dysfunction, unspecified: Secondary | ICD-10-CM | POA: Diagnosis not present

## 2020-06-15 DIAGNOSIS — R3912 Poor urinary stream: Secondary | ICD-10-CM | POA: Diagnosis not present

## 2020-06-15 DIAGNOSIS — L821 Other seborrheic keratosis: Secondary | ICD-10-CM | POA: Diagnosis not present

## 2020-06-15 DIAGNOSIS — Z8616 Personal history of COVID-19: Secondary | ICD-10-CM | POA: Diagnosis not present

## 2020-06-15 DIAGNOSIS — K5730 Diverticulosis of large intestine without perforation or abscess without bleeding: Secondary | ICD-10-CM | POA: Diagnosis not present

## 2020-06-19 DIAGNOSIS — M47816 Spondylosis without myelopathy or radiculopathy, lumbar region: Secondary | ICD-10-CM | POA: Diagnosis not present

## 2020-07-09 ENCOUNTER — Encounter: Payer: Self-pay | Admitting: Emergency Medicine

## 2020-07-09 ENCOUNTER — Other Ambulatory Visit: Payer: Self-pay

## 2020-07-09 ENCOUNTER — Emergency Department
Admission: EM | Admit: 2020-07-09 | Discharge: 2020-07-09 | Disposition: A | Discharge status: Home / Self Care | Payer: Medicare HMO | Attending: Emergency Medicine | Admitting: Emergency Medicine

## 2020-07-09 DIAGNOSIS — Z79890 Hormone replacement therapy (postmenopausal): Secondary | ICD-10-CM | POA: Insufficient documentation

## 2020-07-09 DIAGNOSIS — Y929 Unspecified place or not applicable: Secondary | ICD-10-CM | POA: Diagnosis not present

## 2020-07-09 DIAGNOSIS — Y999 Unspecified external cause status: Secondary | ICD-10-CM | POA: Insufficient documentation

## 2020-07-09 DIAGNOSIS — T783XXA Angioneurotic edema, initial encounter: Secondary | ICD-10-CM | POA: Insufficient documentation

## 2020-07-09 DIAGNOSIS — Z7982 Long term (current) use of aspirin: Secondary | ICD-10-CM | POA: Diagnosis not present

## 2020-07-09 DIAGNOSIS — E039 Hypothyroidism, unspecified: Secondary | ICD-10-CM | POA: Diagnosis not present

## 2020-07-09 DIAGNOSIS — Z96641 Presence of right artificial hip joint: Secondary | ICD-10-CM | POA: Diagnosis not present

## 2020-07-09 DIAGNOSIS — Y939 Activity, unspecified: Secondary | ICD-10-CM | POA: Diagnosis not present

## 2020-07-09 DIAGNOSIS — R0602 Shortness of breath: Secondary | ICD-10-CM | POA: Diagnosis not present

## 2020-07-09 DIAGNOSIS — Z87891 Personal history of nicotine dependence: Secondary | ICD-10-CM | POA: Diagnosis not present

## 2020-07-09 DIAGNOSIS — X58XXXA Exposure to other specified factors, initial encounter: Secondary | ICD-10-CM | POA: Insufficient documentation

## 2020-07-09 DIAGNOSIS — Z96642 Presence of left artificial hip joint: Secondary | ICD-10-CM | POA: Diagnosis not present

## 2020-07-09 DIAGNOSIS — Z79899 Other long term (current) drug therapy: Secondary | ICD-10-CM | POA: Diagnosis not present

## 2020-07-09 LAB — CBC
HCT: 54.7 % — ABNORMAL HIGH (ref 39.0–52.0)
Hemoglobin: 18.2 g/dL — ABNORMAL HIGH (ref 13.0–17.0)
MCH: 30.1 pg (ref 26.0–34.0)
MCHC: 33.3 g/dL (ref 30.0–36.0)
MCV: 90.6 fL (ref 80.0–100.0)
Platelets: 146 10*3/uL — ABNORMAL LOW (ref 150–400)
RBC: 6.04 MIL/uL — ABNORMAL HIGH (ref 4.22–5.81)
RDW: 13.2 % (ref 11.5–15.5)
WBC: 9.2 10*3/uL (ref 4.0–10.5)
nRBC: 0 % (ref 0.0–0.2)

## 2020-07-09 LAB — COMPREHENSIVE METABOLIC PANEL
ALT: 30 U/L (ref 0–44)
AST: 20 U/L (ref 15–41)
Albumin: 3.8 g/dL (ref 3.5–5.0)
Alkaline Phosphatase: 77 U/L (ref 38–126)
Anion gap: 13 (ref 5–15)
BUN: 13 mg/dL (ref 8–23)
CO2: 27 mmol/L (ref 22–32)
Calcium: 9.1 mg/dL (ref 8.9–10.3)
Chloride: 94 mmol/L — ABNORMAL LOW (ref 98–111)
Creatinine, Ser: 0.82 mg/dL (ref 0.61–1.24)
GFR calc Af Amer: >60 mL/min (ref >60)
GFR calc non Af Amer: >60 mL/min (ref >60)
Glucose, Bld: 105 mg/dL — ABNORMAL HIGH (ref 70–99)
Potassium: 4.3 mmol/L (ref 3.5–5.1)
Sodium: 134 mmol/L — ABNORMAL LOW (ref 135–145)
Total Bilirubin: 0.6 mg/dL (ref 0.3–1.2)
Total Protein: 7.3 g/dL (ref 6.5–8.1)

## 2020-07-09 MED ORDER — SODIUM CHLORIDE 0.9 % IV SOLN: 250.0000 mg | Freq: Once | INTRAVENOUS | Status: DC

## 2020-07-09 MED ORDER — METHYLPREDNISOLONE SODIUM SUCC 125 MG IJ SOLR
125.0000 mg | Freq: Once | INTRAMUSCULAR | Status: AC
  Administered 2020-07-09: 125 mg via INTRAVENOUS
  Filled 2020-07-09: qty 2

## 2020-07-09 MED ORDER — FAMOTIDINE IN NACL 20-0.9 MG/50ML-% IV SOLN
20.0000 mg | Freq: Once | INTRAVENOUS | Status: AC
  Administered 2020-07-09: 20 mg via INTRAVENOUS
  Filled 2020-07-09: qty 50

## 2020-07-09 MED ORDER — DIPHENHYDRAMINE HCL 50 MG/ML IJ SOLN
25.0000 mg | Freq: Once | INTRAMUSCULAR | Status: AC
  Administered 2020-07-09: 25 mg via INTRAVENOUS
  Filled 2020-07-09: qty 1

## 2020-07-09 MED ORDER — PREDNISONE 50 MG PO TABS: 50.0000 mg | ORAL_TABLET | Freq: Every day | ORAL | 0 refills | Status: DC

## 2020-07-09 NOTE — ED Triage Notes (Signed)
Pt arrived via POV with reports of angioedema that started around 430pm, pt states it started on the right side moving towards the L. SIgnificant lip and facial swelling and tongue swelling, pt able to speak, but difficult to understand.  Pt does  Not take any BP meds

## 2020-07-09 NOTE — ED Notes (Signed)
Pt alert and oriented, RR even and unlabored.  Swelling appears to have decreased in lower lip.  NAD noted

## 2020-07-09 NOTE — ED Notes (Signed)
See triage note, pt with facial swelling that started at 430 pm. Denies taking losartan.  Significant swelling noted to lips, tongue and facial area.  Pt denies difficulty breathing. Pt currently on 2L [place], does not wear O2 at home. Dr Corky Downs aware.  RR even and unlabored.  Speech somewhat difficult to understand

## 2020-07-09 NOTE — ED Provider Notes (Signed)
[facility] Emergency Department Provider Note   ____________________________________________    I have reviewed the triage vital signs and the nursing notes.   HISTORY  Chief Complaint Angioedema     HPI Thomas Palmer. is a 79 y.o. male who presents with complaints of swelling in his lips which started approximately 2 hours prior to arrival.  He denies hives to rash or itching.  No history of anaphylaxis.  He is not on blood pressure medication.  Reports mild chronic shortness of breath since having Covid in January.  Does not feel that he has worsening shortness of breath now.  No sensation of throat swelling.  Has never had this before.  No other edema  Past Medical History:  Diagnosis Date   Arthritis    "joints in hips and fingers; shoulders too" (07/13/2013)   Chronic lower back pain    Depression    H/O hiatal hernia    Hypothyroidism    Pneumonia 08/2011    Patient Active Problem List   Diagnosis Date Noted   Acute appendicitis 02/22/2018   Acquired hypothyroidism 11/07/2016   Chronic midline low back pain without sciatica 11/02/2015   Diverticulosis of large intestine without hemorrhage 11/02/2015   Low serum testosterone level 11/02/2015   OP (osteoporosis) 11/02/2015   Gastric ulcer 07/07/2014   IDA (iron deficiency anemia) 07/07/2014   S/P total hip arthroplasty 07/27/2013    Past Surgical History:  Procedure Laterality Date   BACK SURGERY     kyphoplasty   CARDIAC CATHETERIZATION  ~ 2009   COLONOSCOPY WITH PROPOFOL N/A 05/03/2019   Procedure: COLONOSCOPY WITH PROPOFOL;  Surgeon: Lollie Sails, MD;  Location: [facility] ENDOSCOPY;  Service: Endoscopy;  Laterality: N/A;   FIXATION KYPHOPLASTY LUMBAR SPINE  ~ 2012   "L1" (07/13/2013)   JOINT REPLACEMENT     LAPAROSCOPIC APPENDECTOMY N/A 02/22/2018   Procedure: APPENDECTOMY LAPAROSCOPIC;  Surgeon: Vickie Epley, MD;  Location: [facility] ORS;   Service: General;  Laterality: N/A;   TOTAL HIP ARTHROPLASTY Left 07/12/2013   Procedure: TOTAL HIP ARTHROPLASTY;  Surgeon: Vickey Huger, MD;  Location: [place];  Service: Orthopedics;  Laterality: Left;   TOTAL HIP ARTHROPLASTY Right 05/17/2002    Prior to Admission medications   Medication Sig Start Date End Date Taking? Authorizing Provider  Ascorbic Acid (VITAMIN C) 1000 MG tablet Take 1,000 mg by mouth daily.    [provider]  aspirin EC 81 MG tablet Take 81 mg by mouth daily.     [provider]  Calcium Ascorbate 500 MG TABS  06/27/14   [provider]  levothyroxine (SYNTHROID, LEVOTHROID) 200 MCG tablet Take by mouth daily before breakfast.     [provider]  Lutein 20 MG CAPS Take 1 capsule by mouth daily.    [provider]  Multiple Vitamins-Minerals (MULTIVITAMIN WITH MINERALS) tablet Take 1 tablet by mouth daily.    [provider]  predniSONE (DELTASONE) 50 MG tablet Take 1 tablet (50 mg total) by mouth daily with breakfast. 07/09/20   Lavonia Drafts, MD  testosterone (ANDROGEL) 50 MG/5GM (1%) GEL Please compound 11/13/17   [provider]  vitamin B-12 (CYANOCOBALAMIN) 100 MCG tablet Take 100 mcg by mouth daily.  04/26/14   [provider]     Allergies Iodine and Contrast media [iodinated diagnostic agents]  History reviewed. No pertinent family history.  Social History Social History   Tobacco Use   Smoking status: Former Smoker  Years: 30.00    Types: Pipe    Quit date: 07/07/1989    Years since quitting: 31.0   Smokeless tobacco: Former Systems developer    Types: Chew    Quit date: 10/28/1989  Substance Use Topics   Alcohol use: Yes    Alcohol/week: 1.0 standard drink    Types: 1 Cans of beer per week   Drug use: No    Review of Systems  Constitutional: No fever/chills Eyes: No visual changes.  ENT: As above Cardiovascular: Denies chest pain. Respiratory: Denies shortness of  breath. Gastrointestinal: No abdominal pain.   Genitourinary: Negative for dysuria. Musculoskeletal: Negative for back pain. Skin: As above Neurological: Negative for headaches or weakness   ____________________________________________   PHYSICAL EXAM:  VITAL SIGNS: ED Triage Vitals  Enc Vitals Group     BP 07/09/20 1905 131/77     Pulse Rate 07/09/20 1905 76     Resp 07/09/20 1905 18     Temp 07/09/20 1905 97.8 F (36.6 C)     Temp Source 07/09/20 1905 Oral     SpO2 07/09/20 1903 (!) 88 %     Weight 07/09/20 1904 86.2 kg (190 lb)     Height 07/09/20 1904 1.803 m (5\' 11" )     Head Circumference --      Peak Flow --      Pain Score 07/09/20 1902 0     Pain Loc --      Pain Edu? --      Excl. in [place]? --     Constitutional: Alert and oriented.   Nose: No congestion/rhinnorhea. Mouth/Throat: Mucous membranes are moist.  Uvula normal, no pharyngeal swelling or edema, no tongue edema.  Significant swelling of the upper and lower lip  Cardiovascular: Normal rate, regular rhythm.  Good peripheral circulation. Respiratory: Normal respiratory effort.  No retractions.   Musculoskeletal:  Warm and well perfused Neurologic:  Normal speech and language. No gross focal neurologic deficits are appreciated.  Skin:  Skin is warm, dry and intact. No rash noted. Psychiatric: Mood and affect are normal. Speech and behavior are normal.  ____________________________________________   LABS (all labs ordered are listed, but only abnormal results are displayed)  Labs Reviewed  CBC - Abnormal; Notable for the following components:      Result Value   RBC 6.04 (*)    Hemoglobin 18.2 (*)    HCT 54.7 (*)    Platelets 146 (*)    All other components within normal limits  COMPREHENSIVE METABOLIC PANEL - Abnormal; Notable for the following components:   Sodium 134 (*)    Chloride 94 (*)    Glucose, Bld 105 (*)    All other components within normal limits    ____________________________________________  EKG  ED ECG REPORT I, Lavonia Drafts, the attending physician, personally viewed and interpreted this ECG.  Date: 07/09/2020  Rhythm: normal sinus rhythm QRS Axis: normal Intervals: normal ST/T Wave abnormalities: normal Narrative Interpretation: no evidence of acute ischemia  ____________________________________________  RADIOLOGY  None ____________________________________________   PROCEDURES  Procedure(s) performed: No  Procedures   Critical Care performed: yes  CRITICAL CARE Performed by: Lavonia Drafts   Total critical care time: 30 minutes  Critical care time was exclusive of separately billable procedures and treating other patients.  Critical care was necessary to treat or prevent imminent or life-threatening deterioration.  Critical care was time spent personally by me on the following activities: development of treatment plan with patient and/or surrogate as well  as nursing, discussions with consultants, evaluation of patient's response to treatment, examination of patient, obtaining history from patient or surrogate, ordering and performing treatments and interventions, ordering and review of laboratory studies, ordering and review of radiographic studies, pulse oximetry and re-evaluation of patient's condition.  ____________________________________________   INITIAL IMPRESSION / ASSESSMENT AND PLAN / ED COURSE  Pertinent labs & imaging results that were available during my care of the patient were reviewed by me and considered in my medical decision making (see chart for details).  Patient presents with angioedema as noted above.  Not on lisinopril or ARB, no new medications.  No history of anaphylaxis or allergic reactions besides nausea after IV dye.  No infectious etiology.  Careful exam does not demonstrate any pharyngeal swelling, no uvular swelling, no tongue swelling.  Given IV Solu-Medrol, IV  Benadryl, IV Pepcid  Reevaluated every 30 minutes for 2 hours, gradual improvement primarily to the lower lip noted thus far.  ----------------------------------------- 10:35 PM on 07/09/2020 -----------------------------------------  Symptoms significantly improved, patient feels well, appropriate for discharge at this time.    ____________________________________________   FINAL CLINICAL IMPRESSION(S) / ED DIAGNOSES  Final diagnoses:  Angioedema, initial encounter        Note:  This document was prepared using Dragon voice recognition software and may include unintentional dictation errors.   Lavonia Drafts, MD 07/09/20 2235

## 2020-07-09 NOTE — ED Notes (Signed)
Dr Corky Downs notified of SpO2 88-91% on RA. Per MD ok with d/c

## 2020-07-11 DIAGNOSIS — M47816 Spondylosis without myelopathy or radiculopathy, lumbar region: Secondary | ICD-10-CM | POA: Diagnosis not present

## 2020-07-12 DIAGNOSIS — E039 Hypothyroidism, unspecified: Secondary | ICD-10-CM | POA: Diagnosis not present

## 2020-07-12 DIAGNOSIS — G8929 Other chronic pain: Secondary | ICD-10-CM | POA: Diagnosis not present

## 2020-07-12 DIAGNOSIS — T783XXA Angioneurotic edema, initial encounter: Secondary | ICD-10-CM | POA: Diagnosis not present

## 2020-07-12 DIAGNOSIS — Z23 Encounter for immunization: Secondary | ICD-10-CM | POA: Diagnosis not present

## 2020-07-12 DIAGNOSIS — M545 Low back pain: Secondary | ICD-10-CM | POA: Diagnosis not present

## 2020-07-27 DIAGNOSIS — Z9889 Other specified postprocedural states: Secondary | ICD-10-CM | POA: Diagnosis not present

## 2020-07-27 DIAGNOSIS — Z8739 Personal history of other diseases of the musculoskeletal system and connective tissue: Secondary | ICD-10-CM | POA: Diagnosis not present

## 2020-07-27 DIAGNOSIS — Z09 Encounter for follow-up examination after completed treatment for conditions other than malignant neoplasm: Secondary | ICD-10-CM | POA: Diagnosis not present

## 2020-10-04 ENCOUNTER — Encounter: Payer: Self-pay | Admitting: Dermatology

## 2020-10-04 ENCOUNTER — Ambulatory Visit: Payer: Medicare HMO | Admitting: Dermatology

## 2020-10-04 ENCOUNTER — Other Ambulatory Visit: Payer: Self-pay

## 2020-10-04 DIAGNOSIS — D485 Neoplasm of uncertain behavior of skin: Secondary | ICD-10-CM

## 2020-10-04 DIAGNOSIS — Z1283 Encounter for screening for malignant neoplasm of skin: Secondary | ICD-10-CM

## 2020-10-04 DIAGNOSIS — L821 Other seborrheic keratosis: Secondary | ICD-10-CM

## 2020-10-04 DIAGNOSIS — D229 Melanocytic nevi, unspecified: Secondary | ICD-10-CM

## 2020-10-04 DIAGNOSIS — L578 Other skin changes due to chronic exposure to nonionizing radiation: Secondary | ICD-10-CM

## 2020-10-04 DIAGNOSIS — L820 Inflamed seborrheic keratosis: Secondary | ICD-10-CM | POA: Diagnosis not present

## 2020-10-04 DIAGNOSIS — D1800 Hemangioma unspecified site: Secondary | ICD-10-CM

## 2020-10-04 DIAGNOSIS — L219 Seborrheic dermatitis, unspecified: Secondary | ICD-10-CM

## 2020-10-04 DIAGNOSIS — L570 Actinic keratosis: Secondary | ICD-10-CM | POA: Diagnosis not present

## 2020-10-04 DIAGNOSIS — L814 Other melanin hyperpigmentation: Secondary | ICD-10-CM

## 2020-10-04 DIAGNOSIS — D489 Neoplasm of uncertain behavior, unspecified: Secondary | ICD-10-CM

## 2020-10-04 MED ORDER — TRIAMCINOLONE ACETONIDE 0.1 % EX LOTN: TOPICAL_LOTION | CUTANEOUS | 2 refills | Status: DC

## 2020-10-04 NOTE — Patient Instructions (Addendum)
Seborrheic Dermatitis  What is seborrheic dermatitis? Seborrheic (say: seb-oh-ree-ick) dermatitis is a disease that causes flaking of the skin.  It usually affects the scalp.  In teenagers and adults, it is commonly called "dandruff".  In infants, it is referred to as "cradle cap".  Dandruff often appears as scaling on the scalp with or without redness.  On other parts of the body, seborrheic dermatitis tends to produce both redness and scaling.  Other common locations of seborrheic dermatitis include the central face, eyebrows, chest, and the creases of the arms, legs, and groin.  It often causes the skin to look a little greasy, scaly, or flaky. Seborrheic dermatitis can occur at any age.  It often comes and goes and may to be seasonally related, especially in the Northern climates.  What causes seborrheic dermatitis? The exact cause is not known, though yeast of the Malassezia species may be involved.  This organism is normally present on the skin in small numbers, but sometimes its numbers increase, especially in oily skin.  Treatments that reduce the yeast tend to improve seborrheic dermatitis.  How is seborrheic dermatitis treated? The treatment of seborrheic dermatitis depends on its location on the body and the person's age. Seborrheic dermatitis of the scalp (dandruff) in adults and teenagers is usually treated with a medicated shampoo.  Here is a list of the medications that help, and the over-counter shampoos that contain them:  Salicylic acid (Neutrogena T/Sal, Sebulex, Scalpicin, Denorex Extra Strength)  Zinc pyrithione (Head & Shoulders white bottle, Denorex Daily, DHS Zinc, Pantene Pro-V Pyrithione Zinc)  Selenium sulfide (Head & Shoulders blue bottle, Selsun Blue, Exsel Lotion Shampoo, Glo-Sel)  Yahoo tar (Neutrogena T/Gal, Pentrax, Zetar, Tegrin, Viacom, Therapeutic Denorex)  Ketoconazole (Nizoral)  If you have dandruff, you might start by using one of these shampoos every day  until your dandruff is controlled and then keep using it at least twice a week.  Often times your doctor will recommend a rotation of several different medicated shampoos as some will experience a plateau in the effectiveness of any one shampoo.   When you use a dandruff shampoo, rub the shampoo into your wet hair and massage into scalp thoroughly.  Let it stay on your hair and scalp for 5 minutes before rinsing.  If you have involvement in the eyebrows or face, you can lather those areas with the medicated shampoo as well, or use a medicated soap (ZNP-bar, Polytar Soap, SAStid, or sulfur soap).    If the wash or shampoo alone does not help, your doctor might want you to use a prescription medication once or twice a day.  Leave-in medications for the scalp are best applied by massaging into the scalp immediately after towel drying your hair, but may be applied even if you have not washed your hair.  Seborrheic dermatitis in infants usually clears up by age 19 -52 months.  It may develop in the diaper area where it might be confused with diaper rash.  For milder cases you can try gently brushing out scales with a soft brush.  This is best done immediately after washing with a non-medicated baby shampoo Wynetta Emery and Royce Macadamia, etc.).  Your doctor may recommend a medicated shampoo or a prescription topical medication.   Topical steroids (such as triamcinolone, fluocinolone, fluocinonide, mometasone, clobetasol, halobetasol, betamethasone, hydrocortisone) can cause thinning and lightening of the skin if they are used for too long in the same area. Your physician has selected the right strength medicine for  your problem and area affected on the body. Please use your medication only as directed by your physician to prevent side effects.   Wound Care Instructions  1. Cleanse wound gently with soap and water once a day then pat dry with clean gauze. Apply a thing coat of Petrolatum (petroleum jelly,  "Vaseline") over the wound (unless you have an allergy to this). We recommend that you use a new, sterile tube of Vaseline. Do not pick or remove scabs. Do not remove the yellow or white "healing tissue" from the base of the wound.  2. Cover the wound with fresh, clean, nonstick gauze and secure with paper tape. You may use Band-Aids in place of gauze and tape if the would is small enough, but would recommend trimming much of the tape off as there is often too much. Sometimes Band-Aids can irritate the skin.  3. You should call the office for your biopsy report after 1 week if you have not already been contacted.  4. If you experience any problems, such as abnormal amounts of bleeding, swelling, significant bruising, significant pain, or evidence of infection, please call the office immediately.  5. FOR ADULT SURGERY PATIENTS: If you need something for pain relief you may take 1 extra strength Tylenol (acetaminophen) AND 2 Ibuprofen (200mg  each) together every 4 hours as needed for pain. (do not take these if you are allergic to them or if you have a reason you should not take them.) Typically, you may only need pain medication for 1 to 3 days.   Cryotherapy Aftercare  . Wash gently with soap and water everyday.   Marland Kitchen Apply Vaseline and Band-Aid daily until healed.    Melanoma ABCDEs  Melanoma is the most dangerous type of skin cancer, and is the leading cause of death from skin disease.  You are more likely to develop melanoma if you:  Have light-colored skin, light-colored eyes, or red or blond hair  Spend a lot of time in the sun  Tan regularly, either outdoors or in a tanning bed  Have had blistering sunburns, especially during childhood  Have a close family member who has had a melanoma  Have atypical moles or large birthmarks  Early detection of melanoma is key since treatment is typically straightforward and cure rates are extremely high if we catch it early.   The first sign  of melanoma is often a change in a mole or a new dark spot.  The ABCDE system is a way of remembering the signs of melanoma.  A for asymmetry:  The two halves do not match. B for border:  The edges of the growth are irregular. C for color:  A mixture of colors are present instead of an even brown color. D for diameter:  Melanomas are usually (but not always) greater than 49mm - the size of a pencil eraser. E for evolution:  The spot keeps changing in size, shape, and color.  Please check your skin once per month between visits. You can use a small mirror in front and a large mirror behind you to keep an eye on the back side or your body.   If you see any new or changing lesions before your next follow-up, please call to schedule a visit.  Please continue daily skin protection including broad spectrum sunscreen SPF 30+ to sun-exposed areas, reapplying every 2 hours as needed when you're outdoors.

## 2020-10-04 NOTE — Progress Notes (Signed)
New Patient Visit  Subjective  Thomas Palmer. is a 79 y.o. male who presents for the following: New Patient (Initial Visit) (New patient here today for initial visit. Presents with concerns about moles on bilateral temple and back. Patient reports family history of skin cancer. ).  Patient here for skin exam and skin cancer screening.  Objective  Well appearing patient in no apparent distress; mood and affect are within normal limits.  All skin waist up examined.  Objective  left forearm x 2, back x 1, left postauricular x 1 vertex scalp x 2 (6): Erythematous thin papules/macules with gritty scale. Hypertrophic at left postauricular   Objective  ears and postauricular: Stuck-on, waxy, tan-brown papules and plaques -- Discussed benign etiology and prognosis.   Objective  left forearm x1, right forearm x 1 (2): Erythematous keratotic or waxy stuck-on papule or plaque.   Objective  Mid Back: 0.6 cm pink papule  R/o bcc        Objective  Left Ear: 0.25 cm dark brown papule  R/o atypia      Assessment & Plan  Actinic keratosis (6) left forearm x 2, back x 1, left postauricular x 1 vertex scalp x 2  Destruction of lesion - left forearm x 2, back x 1, left postauricular x 1 vertex scalp x 2 Complexity: simple   Destruction method: cryotherapy   Informed consent: discussed and consent obtained   Lesion destroyed using liquid nitrogen: Yes   Cryotherapy cycles:  2 Outcome: patient tolerated procedure well with no complications   Post-procedure details: wound care instructions given    Seborrheic dermatitis ears and postauricular  Chronic condition, no cure, only control. Currently flared and significantly symptomatic for patient.  Start Triamcinolone 0.1 % lotion twice a day as needed at ears and scalp for dandruff. Avoid applying to face, groin, and axilla. Use as directed. Risk of skin atrophy with long-term use reviewed.   Topical steroids  (such as triamcinolone, fluocinolone, fluocinonide, mometasone, clobetasol, halobetasol, betamethasone, hydrocortisone) can cause thinning and lightening of the skin if they are used for too long in the same area. Your physician has selected the right strength medicine for your problem and area affected on the body. Please use your medication only as directed by your physician to prevent side effects.    Inflamed seborrheic keratosis (2) left forearm x1, right forearm x 1  ISKs favored. Will treat with cryotherapy. Recheck on follow-up, consider biopsy if indicated  Prior to procedure, discussed risks of blister formation, small wound, skin dyspigmentation, or rare scar following cryotherapy.    Destruction of lesion - left forearm x1, right forearm x 1 Complexity: simple   Destruction method: cryotherapy   Informed consent: discussed and consent obtained   Lesion destroyed using liquid nitrogen: Yes   Cryotherapy cycles:  2 Outcome: patient tolerated procedure well with no complications   Post-procedure details: wound care instructions given    Neoplasm of uncertain behavior (2) Mid Back  Skin / nail biopsy Type of biopsy: tangential   Informed consent: discussed and consent obtained   Timeout: patient name, date of birth, surgical site, and procedure verified   Patient was prepped and draped in usual sterile fashion: Area prepped with isopropyl alcohol. Anesthesia: the lesion was anesthetized in a standard fashion   Anesthetic:  1% lidocaine w/ epinephrine 1-100,000 buffered w/ 8.4% NaHCO3 Instrument used: flexible razor blade   Hemostasis achieved with: aluminum chloride   Outcome: patient tolerated procedure well  Post-procedure details: wound care instructions given   Additional details:  Mupirocin and a bandage applied  Specimen 1 - Surgical pathology Differential Diagnosis:  Check Margins: No 0.6 cm pink papule  R/o bcc  Left Ear  Epidermal / dermal  shaving  Lesion diameter (cm):  0.3 Informed consent: discussed and consent obtained   Timeout: patient name, date of birth, surgical site, and procedure verified   Patient was prepped and draped in usual sterile fashion: area prepped with isopropyl alcohol. Anesthesia: the lesion was anesthetized in a standard fashion   Anesthetic:  1% lidocaine w/ epinephrine 1-100,000 buffered w/ 8.4% NaHCO3 Instrument used: flexible razor blade   Hemostasis achieved with: aluminum chloride   Outcome: patient tolerated procedure well   Post-procedure details: wound care instructions given   Additional details:  Mupirocin and a bandage applied  Specimen 2 - Surgical pathology Differential Diagnosis:  Check Margins: No 0.25 cm dark brown papule  R/o atypia   Seborrheic Keratoses - Stuck-on, waxy, tan-brown papules and plaques  - Discussed benign etiology and prognosis. - Observe - Call for any changes  Lentigines - Scattered tan macules - Discussed due to sun exposure - Benign, observe - Call for any changes  Seborrheic Keratoses - Stuck-on, waxy, tan-brown papules and plaques  - Discussed benign etiology and prognosis. - Observe - Call for any changes  Melanocytic Nevi - Tan-brown and/or pink-flesh-colored symmetric macules and papules - Benign appearing on exam today - Observation - Call clinic for new or changing moles - Recommend daily use of broad spectrum spf 30+ sunscreen to sun-exposed areas.   Hemangiomas - Red papules - Discussed benign nature - Observe - Call for any changes  Actinic Damage - Chronic, secondary to cumulative UV/sun exposure - diffuse scaly erythematous macules with underlying dyspigmentation - Recommend daily broad spectrum sunscreen SPF 30+ to sun-exposed areas, reapply every 2 hours as needed.  - Call for new or changing lesions. Severe, Confluent Chronic Actinic Changes with Pre-Cancerous Actinic Keratoses due to cumulative sun exposure/UV  radiation exposure over time - Discussed "Field Treatment" Field treatment involves treatment of an entire area of skin that has confluent Actinic Changes (Sun/ Ultraviolet light damage) and PreCancerous Actinic Keratoses by method of PhotoDynamic Therapy (PDT) and/or prescription Topical Chemotherapy agents such as 5-fluorouracil, 5-fluorouracil/calcipotriene, and/or imiquimod.  The purpose is to decrease the number of clinically evident and subclinical PreCancerous lesions to prevent progression to development of skin cancer by chemically destroying early precancer changes that may or may not be visible.  It has been shown to reduce the risk of developing skin cancer in the treated area. As a result of treatment, redness, scaling, crusting, and open sores may occur during treatment course. One or more than one of these methods may be used and may have to be used several times to control, suppress and eliminate the PreCancerous changes. Discussed treatment course, expected reaction, and possible side effects. - will schedule for photodynamic therapy of face and neck - plan lower body skin exam at follow-up visit  Skin cancer screening performed today.  Return in about 2 months (around 12/05/2020) for AK follow up, recheck ISKs/NUBs, and lower body exam ; pdt face and neck in january .   I, Ruthell Rummage, CMA, am acting as scribe for Forest Gleason, MD.  Documentation: I have reviewed the above documentation for accuracy and completeness, and I agree with the above.  Forest Gleason, MD

## 2020-10-05 ENCOUNTER — Encounter: Payer: Self-pay | Admitting: Dermatology

## 2020-10-09 NOTE — Progress Notes (Signed)
1. Skin , mid back SEBORRHEIC KERATOSIS, INFLAMED  2. Skin , left ear PIGMENTED SEBORRHEIC KERATOSIS  These are benign growths or "wisdom spots". No additional treatment is needed.

## 2020-10-26 DIAGNOSIS — M47816 Spondylosis without myelopathy or radiculopathy, lumbar region: Secondary | ICD-10-CM | POA: Diagnosis not present

## 2020-11-07 DIAGNOSIS — M67911 Unspecified disorder of synovium and tendon, right shoulder: Secondary | ICD-10-CM | POA: Diagnosis not present

## 2020-11-07 DIAGNOSIS — G8929 Other chronic pain: Secondary | ICD-10-CM | POA: Diagnosis not present

## 2020-11-07 DIAGNOSIS — M19011 Primary osteoarthritis, right shoulder: Secondary | ICD-10-CM | POA: Diagnosis not present

## 2020-11-07 DIAGNOSIS — M7581 Other shoulder lesions, right shoulder: Secondary | ICD-10-CM | POA: Diagnosis not present

## 2020-11-07 DIAGNOSIS — M778 Other enthesopathies, not elsewhere classified: Secondary | ICD-10-CM | POA: Diagnosis not present

## 2020-11-07 DIAGNOSIS — M7541 Impingement syndrome of right shoulder: Secondary | ICD-10-CM | POA: Diagnosis not present

## 2020-11-07 DIAGNOSIS — M7551 Bursitis of right shoulder: Secondary | ICD-10-CM | POA: Diagnosis not present

## 2020-11-07 DIAGNOSIS — M25511 Pain in right shoulder: Secondary | ICD-10-CM | POA: Diagnosis not present

## 2020-11-16 ENCOUNTER — Other Ambulatory Visit: Payer: Self-pay

## 2020-11-16 ENCOUNTER — Ambulatory Visit: Payer: Medicare HMO

## 2020-11-16 DIAGNOSIS — L570 Actinic keratosis: Secondary | ICD-10-CM

## 2020-11-16 MED ORDER — AMINOLEVULINIC ACID HCL 20 % EX SOLR
1.0000 "application " | Freq: Once | CUTANEOUS | Status: AC
  Administered 2020-11-16: 354 mg via TOPICAL

## 2020-11-16 NOTE — Patient Instructions (Signed)

## 2020-11-16 NOTE — Progress Notes (Signed)
1. AK (actinic keratosis) face and neck  Photodynamic therapy - face and neck Procedure discussed: discussed risks, benefits, side effects. and alternatives   Prep: site scrubbed/prepped with acetone   Location:  Face and neck Number of lesions:  Multiple Type of treatment:  Blue light Aminolevulinic Acid (see MAR for details): Levulan Number of Levulan sticks used:  1 Incubation time (minutes):  60 Number of minutes under lamp:  16 Number of seconds under lamp:  40 Cooling:  Floor fan Outcome: patient tolerated procedure well with no complications   Post-procedure details: sunscreen applied and aftercare instructions given to patient    Aminolevulinic Acid HCl 20 % SOLR 354 mg - face and neck

## 2020-12-14 ENCOUNTER — Ambulatory Visit: Payer: Medicare HMO | Admitting: Dermatology

## 2020-12-14 ENCOUNTER — Other Ambulatory Visit: Payer: Self-pay

## 2020-12-14 DIAGNOSIS — M545 Low back pain, unspecified: Secondary | ICD-10-CM | POA: Diagnosis not present

## 2020-12-14 DIAGNOSIS — E039 Hypothyroidism, unspecified: Secondary | ICD-10-CM | POA: Diagnosis not present

## 2020-12-14 DIAGNOSIS — K5730 Diverticulosis of large intestine without perforation or abscess without bleeding: Secondary | ICD-10-CM | POA: Diagnosis not present

## 2020-12-14 DIAGNOSIS — L578 Other skin changes due to chronic exposure to nonionizing radiation: Secondary | ICD-10-CM

## 2020-12-14 DIAGNOSIS — L821 Other seborrheic keratosis: Secondary | ICD-10-CM | POA: Diagnosis not present

## 2020-12-14 DIAGNOSIS — B029 Zoster without complications: Secondary | ICD-10-CM

## 2020-12-14 DIAGNOSIS — Z872 Personal history of diseases of the skin and subcutaneous tissue: Secondary | ICD-10-CM | POA: Diagnosis not present

## 2020-12-14 DIAGNOSIS — L989 Disorder of the skin and subcutaneous tissue, unspecified: Secondary | ICD-10-CM | POA: Diagnosis not present

## 2020-12-14 DIAGNOSIS — N529 Male erectile dysfunction, unspecified: Secondary | ICD-10-CM | POA: Diagnosis not present

## 2020-12-14 DIAGNOSIS — L570 Actinic keratosis: Secondary | ICD-10-CM

## 2020-12-14 DIAGNOSIS — Z9889 Other specified postprocedural states: Secondary | ICD-10-CM | POA: Diagnosis not present

## 2020-12-14 DIAGNOSIS — L820 Inflamed seborrheic keratosis: Secondary | ICD-10-CM

## 2020-12-14 DIAGNOSIS — Z125 Encounter for screening for malignant neoplasm of prostate: Secondary | ICD-10-CM | POA: Diagnosis not present

## 2020-12-14 DIAGNOSIS — Z8616 Personal history of COVID-19: Secondary | ICD-10-CM | POA: Diagnosis not present

## 2020-12-14 MED ORDER — VALACYCLOVIR HCL 1 G PO TABS: 1000.0000 mg | ORAL_TABLET | Freq: Three times a day (TID) | ORAL | 0 refills | Status: AC

## 2020-12-14 NOTE — Progress Notes (Signed)
Follow-Up Visit   Subjective  Thomas Palmer. is a 80 y.o. male who presents for the following: Follow-up (Patient here today for 2 month AK follow up. Patient had AK's treated 2 months ago with LN2 and PDT to face and neck 1 month ago. Patient advises he did not have much of a reaction to PDT other than some redness. ) and Rash (Patient with rash at left upper abdomen that came up yesterday and he is concerned it could be shingles. He has had shingles in the past. No itching. ). He does not an itchy area at his forearm.  The following portions of the chart were reviewed this encounter and updated as appropriate:   Tobacco  Allergies  Meds  Problems  Med Hx  Surg Hx  Fam Hx      Review of Systems:  No other skin or systemic complaints except as noted in HPI or Assessment and Plan.  Objective  Well appearing patient in no apparent distress; mood and affect are within normal limits.  A focused examination was performed including face, neck, chest and back and scalp. Relevant physical exam findings are noted in the Assessment and Plan.  Objective  left angle of mandible: Erythematous thin papules/macules with gritty scale.   Objective  Right Forearm: Erythematous keratotic or waxy stuck-on papule or plaque.   Objective  Left Abdomen (side) - Upper: Vesicles on erythematous base in dermatomal distribution   Assessment & Plan  AK (actinic keratosis) left angle of mandible  Prior to procedure, discussed risks of blister formation, small wound, skin dyspigmentation, or rare scar following cryotherapy.    Destruction of lesion - left angle of mandible Complexity: simple   Destruction method: cryotherapy   Informed consent: discussed and consent obtained   Lesion destroyed using liquid nitrogen: Yes   Cryotherapy cycles:  2 Outcome: patient tolerated procedure well with no complications   Post-procedure details: wound care instructions given    Inflamed  seborrheic keratosis Right Forearm  Prior to procedure, discussed risks of blister formation, small wound, skin dyspigmentation, or rare scar following cryotherapy.    Destruction of lesion - Right Forearm Complexity: simple   Destruction method: cryotherapy   Informed consent: discussed and consent obtained   Lesion destroyed using liquid nitrogen: Yes   Cryotherapy cycles:  2 Outcome: patient tolerated procedure well with no complications   Post-procedure details: wound care instructions given    Herpes zoster without complication Left Abdomen (side) - Upper  Start valacyclovir 1g three times daily for 7 days. Take with large glass of water.  Call if develop any pain or severe itch - may consider gabapentin if needed.   Actinic Damage - chronic, secondary to cumulative UV radiation exposure/sun exposure over time - diffuse scaly erythematous macules with underlying dyspigmentation - Recommend daily broad spectrum sunscreen SPF 30+ to sun-exposed areas, reapply every 2 hours as needed.  - Call for new or changing lesions.  History of PreCancerous Actinic Keratosis  - site(s) of PreCancerous Actinic Keratosis clear today. - these may recur and new lesions may form requiring treatment to prevent transformation into skin cancer - observe for new or changing spots and contact Trainer for appointment if occur - photoprotection with sun protective clothing; sunglasses and broad spectrum sunscreen with SPF of at least 30 + and frequent self skin exams recommended - yearly exams by a dermatologist recommended for persons with history of PreCancerous Actinic Keratoses  Seborrheic Keratoses - Stuck-on, waxy,  tan-brown papules and plaques  - Discussed benign etiology and prognosis. - Observe - Call for any changes  Return in about 10 months (around 10/13/2021) for TBSE.  Graciella Belton, RMA, am acting as scribe for Forest Gleason, MD .  Documentation: I have reviewed  the above documentation for accuracy and completeness, and I agree with the above.  Forest Gleason, MD

## 2020-12-14 NOTE — Patient Instructions (Addendum)
Cryotherapy Aftercare  . Wash gently with soap and water everyday.   . Apply Vaseline and Band-Aid daily until healed.  Prior to procedure, discussed risks of blister formation, small wound, skin dyspigmentation, or rare scar following cryotherapy.   

## 2020-12-21 DIAGNOSIS — D696 Thrombocytopenia, unspecified: Secondary | ICD-10-CM | POA: Diagnosis not present

## 2020-12-21 DIAGNOSIS — E039 Hypothyroidism, unspecified: Secondary | ICD-10-CM | POA: Diagnosis not present

## 2020-12-21 DIAGNOSIS — R1012 Left upper quadrant pain: Secondary | ICD-10-CM | POA: Diagnosis not present

## 2020-12-21 DIAGNOSIS — G5793 Unspecified mononeuropathy of bilateral lower limbs: Secondary | ICD-10-CM | POA: Diagnosis not present

## 2020-12-21 DIAGNOSIS — D751 Secondary polycythemia: Secondary | ICD-10-CM | POA: Diagnosis not present

## 2020-12-21 DIAGNOSIS — Z79899 Other long term (current) drug therapy: Secondary | ICD-10-CM | POA: Diagnosis not present

## 2020-12-21 DIAGNOSIS — R2689 Other abnormalities of gait and mobility: Secondary | ICD-10-CM | POA: Diagnosis not present

## 2020-12-21 DIAGNOSIS — Z0000 Encounter for general adult medical examination without abnormal findings: Secondary | ICD-10-CM | POA: Diagnosis not present

## 2020-12-25 ENCOUNTER — Encounter: Payer: Self-pay | Admitting: Dermatology

## 2021-01-01 ENCOUNTER — Inpatient Hospital Stay: Payer: Medicare HMO

## 2021-01-01 ENCOUNTER — Encounter: Payer: Self-pay | Admitting: Oncology

## 2021-01-01 ENCOUNTER — Inpatient Hospital Stay: Payer: Medicare HMO | Attending: Oncology | Admitting: Oncology

## 2021-01-01 VITALS — BP 151/92 | HR 62 | Temp 96.1°F | Resp 16 | Wt 202.8 lb

## 2021-01-01 DIAGNOSIS — Z79890 Hormone replacement therapy (postmenopausal): Secondary | ICD-10-CM

## 2021-01-01 DIAGNOSIS — Z800 Family history of malignant neoplasm of digestive organs: Secondary | ICD-10-CM | POA: Diagnosis not present

## 2021-01-01 DIAGNOSIS — D751 Secondary polycythemia: Secondary | ICD-10-CM | POA: Diagnosis not present

## 2021-01-01 DIAGNOSIS — Z808 Family history of malignant neoplasm of other organs or systems: Secondary | ICD-10-CM | POA: Insufficient documentation

## 2021-01-01 DIAGNOSIS — Z79899 Other long term (current) drug therapy: Secondary | ICD-10-CM | POA: Diagnosis not present

## 2021-01-01 DIAGNOSIS — D696 Thrombocytopenia, unspecified: Secondary | ICD-10-CM

## 2021-01-01 DIAGNOSIS — Z87891 Personal history of nicotine dependence: Secondary | ICD-10-CM | POA: Diagnosis not present

## 2021-01-01 HISTORY — DX: Secondary polycythemia: D75.1

## 2021-01-01 LAB — COMPREHENSIVE METABOLIC PANEL
ALT: 24 U/L (ref 0–44)
AST: 28 U/L (ref 15–41)
Albumin: 4.1 g/dL (ref 3.5–5.0)
Alkaline Phosphatase: 34 U/L — ABNORMAL LOW (ref 38–126)
Anion gap: 10 (ref 5–15)
BUN: 16 mg/dL (ref 8–23)
CO2: 32 mmol/L (ref 22–32)
Calcium: 8.9 mg/dL (ref 8.9–10.3)
Chloride: 94 mmol/L — ABNORMAL LOW (ref 98–111)
Creatinine, Ser: 0.99 mg/dL (ref 0.61–1.24)
GFR, Estimated: >60 mL/min (ref >60)
Glucose, Bld: 98 mg/dL (ref 70–99)
Potassium: 4.7 mmol/L (ref 3.5–5.1)
Sodium: 136 mmol/L (ref 135–145)
Total Bilirubin: 0.8 mg/dL (ref 0.3–1.2)
Total Protein: 7.2 g/dL (ref 6.5–8.1)

## 2021-01-01 LAB — CBC WITH DIFFERENTIAL/PLATELET
Basophils Absolute: 0.4 10*3/uL — ABNORMAL HIGH (ref 0.0–0.1)
Basophils Relative: 1 %
Eosinophils Absolute: 0.9 10*3/uL — ABNORMAL HIGH (ref 0.0–0.5)
Eosinophils Relative: 2 %
HCT: 57.5 % — ABNORMAL HIGH (ref 39.0–52.0)
Hemoglobin: 18.5 g/dL — ABNORMAL HIGH (ref 13.0–17.0)
Lymphocytes Relative: 19 %
Lymphs Abs: 1.1 10*3/uL (ref 0.7–4.0)
MCH: 30.6 pg (ref 26.0–34.0)
MCHC: 32.2 g/dL (ref 30.0–36.0)
MCV: 95.2 fL (ref 80.0–100.0)
Monocytes Absolute: 0.6 10*3/uL (ref 0.1–1.0)
Monocytes Relative: 11 %
Neutro Abs: 3.7 10*3/uL (ref 1.7–7.7)
Neutrophils Relative %: 67 %
Platelets: 144 10*3/uL — ABNORMAL LOW (ref 150–400)
RBC: 6.04 MIL/uL — ABNORMAL HIGH (ref 4.22–5.81)
RDW: 13.7 % (ref 11.5–15.5)
WBC: 5.5 10*3/uL (ref 4.0–10.5)

## 2021-01-01 NOTE — Progress Notes (Signed)
Hematology/Oncology Consult note [facility] [phone] [phone]   Patient Care Team: Tracie Harrier, MD as PCP - General (Internal Medicine)  REFERRING PROVIDER: Tracie Harrier, MD  CHIEF COMPLAINTS/REASON FOR VISIT:  Evaluation of polycytosis/erythrocytosis  HISTORY OF PRESENTING ILLNESS:  Thomas Palmer. is a 80 y.o. male who was seen in consultation at the request of Tracie Harrier, MD for evaluation of polycytosis/erythrocytosis Reviewed recent labs done at the primary care provider's office. 12/14/2020, hemoglobin 19.4, hematocrit 59.2, MCV 94.7, WBC 5.6, Chronic Onset, intermittent, duration since 2020. No aggravating or alleviating factors.   Associated signs or symptoms: Denies weight loss, fever, chills, fatigue, night sweats.   Context:  Smoking history: Former smoker. Testosterone supplements: Patient is on testosterone cream. History of blood clots: Denies Daytime somnolence: Denies Family history of polycythemia: Denies    Review of Systems  Constitutional: Negative for appetite change, chills, fatigue, fever and unexpected weight change.  HENT:   Negative for hearing loss and voice change.   Eyes: Negative for eye problems and icterus.  Respiratory: Negative for chest tightness, cough and shortness of breath.   Cardiovascular: Negative for chest pain and leg swelling.  Gastrointestinal: Negative for abdominal distention and abdominal pain.  Endocrine: Negative for hot flashes.  Genitourinary: Negative for difficulty urinating, dysuria and frequency.   Musculoskeletal: Negative for arthralgias.  Skin: Negative for itching and rash.  Neurological: Negative for light-headedness and numbness.  Hematological: Negative for adenopathy. Does not bruise/bleed easily.  Psychiatric/Behavioral: Negative for confusion.    MEDICAL HISTORY:  Past Medical History:  Diagnosis Date   Arthritis     "joints in hips and fingers; shoulders too" (07/13/2013)   Chronic lower back pain    Depression    H/O hiatal hernia    Hypothyroidism    Pneumonia 08/2011    SURGICAL HISTORY: Past Surgical History:  Procedure Laterality Date   ACHILLES TENDON REPAIR Left 2021   BACK SURGERY     kyphoplasty   CARDIAC CATHETERIZATION  ~ 2009   COLONOSCOPY WITH PROPOFOL N/A 05/03/2019   Procedure: COLONOSCOPY WITH PROPOFOL;  Surgeon: Lollie Sails, MD;  Location: [facility] ENDOSCOPY;  Service: Endoscopy;  Laterality: N/A;   FIXATION KYPHOPLASTY LUMBAR SPINE  ~ 2012   "L1" (07/13/2013)   JOINT REPLACEMENT     LAPAROSCOPIC APPENDECTOMY N/A 02/22/2018   Procedure: APPENDECTOMY LAPAROSCOPIC;  Surgeon: Vickie Epley, MD;  Location: [facility] ORS;  Service: General;  Laterality: N/A;   TOTAL HIP ARTHROPLASTY Left 07/12/2013   Procedure: TOTAL HIP ARTHROPLASTY;  Surgeon: Vickey Huger, MD;  Location: [place];  Service: Orthopedics;  Laterality: Left;   TOTAL HIP ARTHROPLASTY Right 05/17/2002    SOCIAL HISTORY: Social History   Socioeconomic History   Marital status: Married    Spouse name: Not on file   Number of children: Not on file   Years of education: Not on file   Highest education level: Not on file  Occupational History   Not on file  Tobacco Use   Smoking status: Former Smoker    Years: 30.00    Types: Pipe    Quit date: 07/07/1989    Years since quitting: 31.5   Smokeless tobacco: Former Systems developer    Types: [place] date: 10/28/1989  Substance and Sexual Activity   Alcohol use: Yes    Alcohol/week: 1.0 standard drink    Types: 1 Cans of beer per week   Drug use: No   Sexual activity: Yes  Other Topics Concern   Not on file  Social History Narrative   Not on file   Social Determinants of Health   Financial Resource Strain: Not on file  Food Insecurity: Not on file  Transportation Needs: Not on file  Physical Activity: Not on file  Stress: Not on file   Social Connections: Not on file  Intimate Partner Violence: Not on file    FAMILY HISTORY: Family History  Problem Relation Age of Onset   Skin cancer Father    Stomach cancer Maternal Grandmother     ALLERGIES:  is allergic to iodine and contrast media [iodinated diagnostic agents].  MEDICATIONS:  Current Outpatient Medications  Medication Sig Dispense Refill   Ascorbic Acid (VITAMIN C) 1000 MG tablet Take 1,000 mg by mouth daily.     Calcium Ascorbate 500 MG TABS      gabapentin (NEURONTIN) 100 MG capsule Take 1 capsule by mouth 2 (two) times daily.     levothyroxine (SYNTHROID, LEVOTHROID) 200 MCG tablet Take 250 mcg by mouth daily before breakfast.     Lutein 20 MG CAPS Take 1 capsule by mouth daily.     Multiple Vitamins-Minerals (MULTIVITAMIN WITH MINERALS) tablet Take 1 tablet by mouth daily.     tamsulosin (FLOMAX) 0.4 MG CAPS capsule Take by mouth.     testosterone (ANDROGEL) 50 MG/5GM (1%) GEL Please compound     vitamin B-12 (CYANOCOBALAMIN) 100 MCG tablet Take 100 mcg by mouth daily.      predniSONE (DELTASONE) 50 MG tablet Take 1 tablet (50 mg total) by mouth daily with breakfast. (Patient not taking: No sig reported) 5 tablet 0   triamcinolone lotion (KENALOG) 0.1 % Use 1 to 2 times daily as needed (Patient not taking: Reported on 01/01/2021) 60 mL 2   No current facility-administered medications for this visit.     PHYSICAL EXAMINATION: ECOG PERFORMANCE STATUS: 1 - Symptomatic but completely ambulatory Vitals:   01/01/21 1052  BP: (!) 151/92  Pulse: 62  Resp: 16  Temp: (!) 96.1 F (35.6 C)   Filed Weights   01/01/21 1052  Weight: 202 lb 12.8 oz (92 kg)    Physical Exam Constitutional:      General: He is not in acute distress. HENT:     Head: Normocephalic and atraumatic.  Eyes:     General: No scleral icterus. Cardiovascular:     Rate and Rhythm: Normal rate and regular rhythm.     Heart sounds: Normal heart sounds.  Pulmonary:      Effort: Pulmonary effort is normal. No respiratory distress.     Breath sounds: No wheezing.  Abdominal:     General: Bowel sounds are normal. There is no distension.     Palpations: Abdomen is soft.  Musculoskeletal:        General: No deformity. Normal range of motion.     Cervical back: Normal range of motion and neck supple.  Skin:    General: Skin is warm and dry.     Findings: No erythema or rash.  Neurological:     Mental Status: He is alert and oriented to person, place, and time. Mental status is at baseline.     Cranial Nerves: No cranial nerve deficit.     Coordination: Coordination normal.  Psychiatric:        Mood and Affect: Mood normal.     RADIOGRAPHIC STUDIES: I have personally reviewed the radiological images as listed and agreed with the findings in the report. No  results found.   LABORATORY DATA:  I have reviewed the data as listed Lab Results  Component Value Date   WBC 5.5 01/01/2021   HGB 18.5 (H) 01/01/2021   HCT 57.5 (H) 01/01/2021   MCV 95.2 01/01/2021   PLT 144 (L) 01/01/2021   Recent Labs    07/09/20 1907 01/01/21 1150  NA 134* 136  K 4.3 4.7  CL 94* 94*  CO2 27 32  GLUCOSE 105* 98  BUN 13 16  CREATININE 0.82 0.99  CALCIUM 9.1 8.9  GFRNONAA >60 >60  GFRAA >60  --   PROT 7.3 7.2  ALBUMIN 3.8 4.1  AST 20 28  ALT 30 24  ALKPHOS 77 34*  BILITOT 0.6 0.8   Iron/TIBC/Ferritin/ %Sat No results found for: IRON, TIBC, FERRITIN, IRONPCTSAT      ASSESSMENT & PLAN:  1. Erythrocytosis   2. Long-term current use of testosterone replacement therapy   3. Thrombocytopenia ([place])    Labs are reviewed and discussed with patient. Polycythemia (erythrocytosis) is an abnormal elevation of hemoglobin (Hgb) and/or hematocrit (Hct) in peripheral blood, and this can be caused by primary etiology, ie bone marrow mutation, or secondary etiology, ie hypoxia, smoking, androgen supplements, etc.  Patient is on chronic testosterone replacement therapy,  and most likely the cause of erythrocytosis.  Rule out other etiologies. I will obtain erythropoietin, rule out primary etiology, JAK2 with reflex to other mutations, BCR-ABL.    Today's labs were available after his encounter.  Hemoglobin 18.5, hematocrit 57.5. We will arrange patient to proceed with therapeutic phlebotomy 500 cc weekly x 2.  Advised patient to further discuss with primary care provider to see if feasible to decrease testosterone dosage. Patient follow-up in the clinic in 2 weeks to discuss results.  Thrombocytopenia is very mild.  Monitor.  Orders Placed This Encounter  Procedures   CBC with Differential/Platelet    Standing Status:   Future    Number of Occurrences:   1    Standing Expiration Date:   01/01/2022   Comprehensive metabolic panel    Standing Status:   Future    Number of Occurrences:   1    Standing Expiration Date:   01/01/2022   JAK2 V617F, w Reflex to CALR/E12/MPL    Standing Status:   Future    Number of Occurrences:   1    Standing Expiration Date:   01/01/2022   BCR-ABL1 FISH    Standing Status:   Future    Number of Occurrences:   1    Standing Expiration Date:   01/01/2022   Carbon monoxide, blood (performed at ref lab)    Standing Status:   Future    Number of Occurrences:   1    Standing Expiration Date:   01/01/2022   Erythropoietin    Standing Status:   Future    Number of Occurrences:   1    Standing Expiration Date:   01/01/2022    We spent sufficient time to discuss many aspect of care, questions were answered to patient's satisfaction. The patient knows to call the clinic with any problems questions or concerns.  Cc Tracie Harrier, MD  Return of visit: 2 weeks Thank you for this kind referral and the opportunity to participate in the care of this patient. A copy of today's note is routed to referring provider    Earlie Server, MD, PhD 01/01/2021

## 2021-01-01 NOTE — Progress Notes (Signed)
New patient evaluation.  n

## 2021-01-02 ENCOUNTER — Telehealth: Payer: Self-pay

## 2021-01-02 LAB — CARBON MONOXIDE, BLOOD (PERFORMED AT REF LAB): Carbon Monoxide, Blood: 2.6 % (ref 0.0–3.6)

## 2021-01-02 LAB — ERYTHROPOIETIN: Erythropoietin: 13.8 m[IU]/mL (ref 2.6–18.5)

## 2021-01-02 NOTE — Telephone Encounter (Signed)
-----   Message from Earlie Server, MD sent at 01/01/2021  8:31 PM EST ----- Please arrange him to get phlebotomy this week x1 and another phlebotomy next week. [no need to repeat cbc] Also add lab encounter when he comes to see me in 2 weeks, H&H +/- phlebotomy

## 2021-01-02 NOTE — Telephone Encounter (Signed)
Please schedule appts as MD recommends and notify pt of appts.

## 2021-01-02 NOTE — Telephone Encounter (Signed)
Done  All appt has been sched as requested Phlebo on 3/10, 3/18 and will also have labs drawn on 3/18  Labs needed before his 01/16/21 MyChart Visit

## 2021-01-04 ENCOUNTER — Inpatient Hospital Stay: Payer: Medicare HMO

## 2021-01-04 VITALS — BP 124/89 | HR 68 | Temp 97.5°F | Resp 20

## 2021-01-04 DIAGNOSIS — D696 Thrombocytopenia, unspecified: Secondary | ICD-10-CM | POA: Diagnosis not present

## 2021-01-04 DIAGNOSIS — D751 Secondary polycythemia: Secondary | ICD-10-CM | POA: Diagnosis not present

## 2021-01-04 DIAGNOSIS — Z87891 Personal history of nicotine dependence: Secondary | ICD-10-CM | POA: Diagnosis not present

## 2021-01-04 DIAGNOSIS — Z79899 Other long term (current) drug therapy: Secondary | ICD-10-CM | POA: Diagnosis not present

## 2021-01-04 DIAGNOSIS — Z808 Family history of malignant neoplasm of other organs or systems: Secondary | ICD-10-CM | POA: Diagnosis not present

## 2021-01-04 DIAGNOSIS — Z800 Family history of malignant neoplasm of digestive organs: Secondary | ICD-10-CM | POA: Diagnosis not present

## 2021-01-04 LAB — BCR-ABL1 FISH
Cells Analyzed: 200
Cells Counted: 200

## 2021-01-04 NOTE — Progress Notes (Signed)
Pt tolerated his first therapeutic phlebotomy today. Denied any weakness or dizziness post procedure. Ate peanut butter crackers and drank water prior to discharge. VSS. Ambulatory.

## 2021-01-04 NOTE — Patient Instructions (Signed)

## 2021-01-12 ENCOUNTER — Inpatient Hospital Stay: Payer: Medicare HMO | Attending: Oncology

## 2021-01-12 ENCOUNTER — Inpatient Hospital Stay: Payer: Medicare HMO

## 2021-01-12 ENCOUNTER — Other Ambulatory Visit: Payer: Self-pay

## 2021-01-12 DIAGNOSIS — D751 Secondary polycythemia: Secondary | ICD-10-CM | POA: Insufficient documentation

## 2021-01-12 LAB — HEMOGLOBIN AND HEMATOCRIT, BLOOD
HCT: 47.1 % (ref 39.0–52.0)
Hemoglobin: 15.7 g/dL (ref 13.0–17.0)

## 2021-01-12 NOTE — Progress Notes (Signed)
Informed pt that he did not require phlebotomy today. His HCT is 47.1 today, which is below parameters set by MD orders. Patient pleased by results.

## 2021-01-14 LAB — CALR + JAK2 E12-15 + MPL (REFLEXED)

## 2021-01-14 LAB — JAK2 V617F, W REFLEX TO CALR/E12/MPL

## 2021-01-16 ENCOUNTER — Inpatient Hospital Stay (HOSPITAL_BASED_OUTPATIENT_CLINIC_OR_DEPARTMENT_OTHER): Payer: Medicare HMO | Admitting: Oncology

## 2021-01-16 ENCOUNTER — Encounter: Payer: Self-pay | Admitting: Oncology

## 2021-01-16 DIAGNOSIS — Z79890 Hormone replacement therapy (postmenopausal): Secondary | ICD-10-CM | POA: Diagnosis not present

## 2021-01-16 DIAGNOSIS — D751 Secondary polycythemia: Secondary | ICD-10-CM

## 2021-01-16 NOTE — Progress Notes (Signed)
HEMATOLOGY-ONCOLOGY TeleHEALTH VISIT PROGRESS NOTE  I connected with Thomas Palmer. on 01/16/21  at  2:00 PM EDT by video enabled telemedicine visit and verified that I am speaking with the correct person using two identifiers. I discussed the limitations, risks, security and privacy concerns of performing an evaluation and management service by telemedicine and the availability of in-person appointments. The patient expressed understanding and agreed to proceed.   Other persons participating in the visit and their role in the encounter:  None  Patient's location: Home  Provider's location: office Chief Complaint: Follow-up for erythrocytosis   INTERVAL HISTORY Thomas Bogle. is a 80 y.o. male who has above history reviewed by me today presents for follow up visit for secondary erythrocytosis Problems and complaints are listed below:  Patient has had decreased her testosterone dosage.  He has also had phlebotomy session and tolerated well.  Today he has no new complaints.  Review of Systems  Constitutional: Positive for fatigue. Negative for appetite change, chills, fever and unexpected weight change.  HENT:   Negative for hearing loss and voice change.   Eyes: Negative for eye problems and icterus.  Respiratory: Negative for chest tightness, cough and shortness of breath.   Cardiovascular: Negative for chest pain and leg swelling.  Gastrointestinal: Negative for abdominal distention and abdominal pain.  Endocrine: Negative for hot flashes.  Genitourinary: Negative for difficulty urinating, dysuria and frequency.   Musculoskeletal: Negative for arthralgias.  Skin: Negative for itching and rash.  Neurological: Negative for light-headedness and numbness.  Hematological: Negative for adenopathy. Does not bruise/bleed easily.  Psychiatric/Behavioral: Negative for confusion.    Past Medical History:  Diagnosis Date  . Arthritis    "joints in hips and fingers;  shoulders too" (07/13/2013)  . Chronic lower back pain   . Depression   . Erythrocytosis 01/01/2021  . H/O hiatal hernia   . Hypothyroidism   . Pneumonia 08/2011   Past Surgical History:  Procedure Laterality Date  . ACHILLES TENDON REPAIR Left 2021  . BACK SURGERY     kyphoplasty  . CARDIAC CATHETERIZATION  ~ 2009  . COLONOSCOPY WITH PROPOFOL N/A 05/03/2019   Procedure: COLONOSCOPY WITH PROPOFOL;  Surgeon: Lollie Sails, MD;  Location: [facility] ENDOSCOPY;  Service: Endoscopy;  Laterality: N/A;  . FIXATION KYPHOPLASTY LUMBAR SPINE  ~ 2012   "L1" (07/13/2013)  . JOINT REPLACEMENT    . LAPAROSCOPIC APPENDECTOMY N/A 02/22/2018   Procedure: APPENDECTOMY LAPAROSCOPIC;  Surgeon: Vickie Epley, MD;  Location: [facility] ORS;  Service: General;  Laterality: N/A;  . TOTAL HIP ARTHROPLASTY Left 07/12/2013   Procedure: TOTAL HIP ARTHROPLASTY;  Surgeon: Vickey Huger, MD;  Location: [place];  Service: Orthopedics;  Laterality: Left;  . TOTAL HIP ARTHROPLASTY Right 05/17/2002    Family History  Problem Relation Age of Onset  . Skin cancer Father   . Stomach cancer Maternal Grandmother     Social History   Socioeconomic History  . Marital status: Married    Spouse name: Not on file  . Number of children: Not on file  . Years of education: Not on file  . Highest education level: Not on file  Occupational History  . Not on file  Tobacco Use  . Smoking status: Former Smoker    Years: 30.00    Types: Pipe    Quit date: 07/07/1989    Years since quitting: 31.5  . Smokeless tobacco: Former Systems developer    Types: Chew    Quit date: 10/28/1989  Substance and Sexual Activity  . Alcohol use: Yes    Alcohol/week: 1.0 standard drink    Types: 1 Cans of beer per week  . Drug use: No  . Sexual activity: Yes  Other Topics Concern  . Not on file  Social History Narrative  . Not on file   Social Determinants of Health   Financial Resource Strain: Not on file  Food Insecurity: Not on file  Transportation  Needs: Not on file  Physical Activity: Not on file  Stress: Not on file  Social Connections: Not on file  Intimate Partner Violence: Not on file    Current Outpatient Medications on File Prior to Visit  Medication Sig Dispense Refill  . Ascorbic Acid (VITAMIN C) 1000 MG tablet Take 1,000 mg by mouth daily.    . Calcium Ascorbate 500 MG TABS     . gabapentin (NEURONTIN) 100 MG capsule Take 1 capsule by mouth 2 (two) times daily.    [place] levothyroxine (SYNTHROID, LEVOTHROID) 200 MCG tablet Take 250 mcg by mouth daily before breakfast.    . Lutein 20 MG CAPS Take 1 capsule by mouth daily.    . Multiple Vitamins-Minerals (MULTIVITAMIN WITH MINERALS) tablet Take 1 tablet by mouth daily.    . tamsulosin (FLOMAX) 0.4 MG CAPS capsule Take by mouth.    . testosterone (ANDROGEL) 50 MG/5GM (1%) GEL Please compound    . vitamin B-12 (CYANOCOBALAMIN) 100 MCG tablet Take 100 mcg by mouth daily.      No current facility-administered medications on file prior to visit.    Allergies  Allergen Reactions  . Iodine Nausea Only    Contrast Dye - causes severe nausea  . Contrast Media [Iodinated Diagnostic Agents] Nausea And Vomiting       Observations/Objective: There were no vitals filed for this visit. There is no height or weight on file to calculate BMI.  Physical Exam Neurological:     Mental Status: He is alert.     CBC    Component Value Date/Time   WBC 5.5 01/01/2021 1150   RBC 6.04 (H) 01/01/2021 1150   HGB 15.7 01/12/2021 1253   HGB 12.6 (L) 01/08/2014 0432   HCT 47.1 01/12/2021 1253   HCT 39.3 (L) 01/08/2014 0432   PLT 144 (L) 01/01/2021 1150   PLT 155 01/08/2014 0432   MCV 95.2 01/01/2021 1150   MCV 86 01/08/2014 0432   MCH 30.6 01/01/2021 1150   MCHC 32.2 01/01/2021 1150   RDW 13.7 01/01/2021 1150   RDW 14.1 01/08/2014 0432   LYMPHSABS 1.1 01/01/2021 1150   LYMPHSABS 0.6 (L) 01/08/2014 0432   MONOABS 0.6 01/01/2021 1150   MONOABS 0.9 01/08/2014 0432   EOSABS 0.9 (H)  01/01/2021 1150   EOSABS 0.1 01/08/2014 0432   BASOSABS 0.4 (H) 01/01/2021 1150   BASOSABS 0.0 01/08/2014 0432    CMP     Component Value Date/Time   NA 136 01/01/2021 1150   K 4.7 01/01/2021 1150   CL 94 (L) 01/01/2021 1150   CO2 32 01/01/2021 1150   GLUCOSE 98 01/01/2021 1150   BUN 16 01/01/2021 1150   CREATININE 0.99 01/01/2021 1150   CREATININE 0.88 12/28/2012 1203   CALCIUM 8.9 01/01/2021 1150   PROT 7.2 01/01/2021 1150   ALBUMIN 4.1 01/01/2021 1150   AST 28 01/01/2021 1150   ALT 24 01/01/2021 1150   ALKPHOS 34 (L) 01/01/2021 1150   BILITOT 0.8 01/01/2021 1150   GFRNONAA >60 01/01/2021 1150   GFRNONAA >  60 12/28/2012 1203   GFRAA >60 07/09/2020 1907   GFRAA >60 12/28/2012 1203     Assessment and Plan: 1. Secondary erythrocytosis   2. Long-term current use of testosterone replacement therapy     Labs reviewed and discussed with patient JAK2 V617F mutation negative, with reflex to other mutations CALR, MPL, JAK 2 Ex 12-15 mutations negative. BCR ABL FISH negative, normal carbon monoxide level. Likely due to testosterone replacement therapy. Patient has tolerated phlebotomy.  Today His lab showed no need for additional phlebotomy at this point.  Mild  thrombocytopenia recommend observation Follow Up Instructions: I recommend patient to repeat H&H in 6 weeks +/- phlebotomy. Follow-up with me in 12 weeks with CBC +/- phlebotomy.   I discussed the assessment and treatment plan with the patient. The patient was provided an opportunity to ask questions and all were answered. The patient agreed with the plan and demonstrated an understanding of the instructions.  The patient was advised to call back or seek an in-person evaluation if the symptoms worsen or if the condition fails to improve as anticipated.    Earlie Server, MD 01/16/2021 10:15 PM

## 2021-01-16 NOTE — Progress Notes (Signed)
Patient contacted for Mychart visit. No new concerns voiced.  

## 2021-01-25 DIAGNOSIS — R197 Diarrhea, unspecified: Secondary | ICD-10-CM | POA: Diagnosis not present

## 2021-02-15 DIAGNOSIS — M48062 Spinal stenosis, lumbar region with neurogenic claudication: Secondary | ICD-10-CM | POA: Diagnosis not present

## 2021-02-15 DIAGNOSIS — M47816 Spondylosis without myelopathy or radiculopathy, lumbar region: Secondary | ICD-10-CM | POA: Diagnosis not present

## 2021-02-15 DIAGNOSIS — Z6827 Body mass index (BMI) 27.0-27.9, adult: Secondary | ICD-10-CM | POA: Diagnosis not present

## 2021-02-27 ENCOUNTER — Inpatient Hospital Stay: Payer: Medicare HMO

## 2021-02-27 ENCOUNTER — Inpatient Hospital Stay: Payer: Medicare HMO | Attending: Oncology

## 2021-02-27 ENCOUNTER — Other Ambulatory Visit: Payer: Self-pay

## 2021-02-27 DIAGNOSIS — D751 Secondary polycythemia: Secondary | ICD-10-CM | POA: Insufficient documentation

## 2021-02-27 LAB — HEMOGLOBIN AND HEMATOCRIT, BLOOD
HCT: 44.4 % (ref 39.0–52.0)
Hemoglobin: 14.8 g/dL (ref 13.0–17.0)

## 2021-02-27 NOTE — Progress Notes (Signed)
HCT 44.4 today. Per MD parameters, no phlebotomy is required today. Pt informed of his results today.

## 2021-03-22 DIAGNOSIS — M47816 Spondylosis without myelopathy or radiculopathy, lumbar region: Secondary | ICD-10-CM | POA: Diagnosis not present

## 2021-04-11 ENCOUNTER — Other Ambulatory Visit: Payer: Self-pay

## 2021-04-11 ENCOUNTER — Encounter: Payer: Self-pay | Admitting: Oncology

## 2021-04-11 ENCOUNTER — Inpatient Hospital Stay: Payer: Medicare HMO | Attending: Oncology

## 2021-04-11 ENCOUNTER — Inpatient Hospital Stay (HOSPITAL_BASED_OUTPATIENT_CLINIC_OR_DEPARTMENT_OTHER): Payer: Medicare HMO | Admitting: Oncology

## 2021-04-11 ENCOUNTER — Inpatient Hospital Stay: Payer: Medicare HMO

## 2021-04-11 VITALS — BP 115/74 | HR 63

## 2021-04-11 VITALS — BP 128/90 | HR 66 | Temp 97.0°F | Resp 18 | Wt 193.0 lb

## 2021-04-11 DIAGNOSIS — M1990 Unspecified osteoarthritis, unspecified site: Secondary | ICD-10-CM | POA: Diagnosis not present

## 2021-04-11 DIAGNOSIS — M545 Low back pain, unspecified: Secondary | ICD-10-CM | POA: Diagnosis not present

## 2021-04-11 DIAGNOSIS — E039 Hypothyroidism, unspecified: Secondary | ICD-10-CM | POA: Insufficient documentation

## 2021-04-11 DIAGNOSIS — G8929 Other chronic pain: Secondary | ICD-10-CM | POA: Insufficient documentation

## 2021-04-11 DIAGNOSIS — D751 Secondary polycythemia: Secondary | ICD-10-CM | POA: Diagnosis not present

## 2021-04-11 DIAGNOSIS — Z79899 Other long term (current) drug therapy: Secondary | ICD-10-CM | POA: Diagnosis not present

## 2021-04-11 LAB — HEMOGLOBIN AND HEMATOCRIT, BLOOD
HCT: 52.4 % — ABNORMAL HIGH (ref 39.0–52.0)
Hemoglobin: 17.3 g/dL — ABNORMAL HIGH (ref 13.0–17.0)

## 2021-04-11 MED ORDER — SODIUM CHLORIDE 0.9 % IV SOLN
Freq: Once | INTRAVENOUS | Status: DC
  Filled 2021-04-11: qty 250

## 2021-04-11 NOTE — Progress Notes (Signed)
Pt in for follow up reports "weak in the legs".

## 2021-04-11 NOTE — Patient Instructions (Signed)
[facility]COL[facility]   Discharge Instructions: Thank you for choosing [facility] to provide your oncology and hematology care.  If you have a lab appointment with the [facility], please go directly to the [facility] and check in at the registration area.  Wear comfortable clothing and clothing appropriate for easy access to any Portacath or PICC line.   We strive to give you quality time with your provider. You may need to reschedule your appointment if you arrive late (15 or more minutes).  Arriving late affects you and other patients whose appointments are after yours.  Also, if you miss three or more appointments without notifying the office, you may be dismissed from the clinic at the provider's discretion.      For prescription refill requests, have your pharmacy contact our office and allow 72 hours for refills to be completed.    Today you received a phlebotomy    To help prevent nausea and vomiting after your treatment, we encourage you to take your nausea medication as directed.  BELOW ARE SYMPTOMS THAT SHOULD BE REPORTED IMMEDIATELY: *FEVER GREATER THAN 100.4 F (38 C) OR HIGHER *CHILLS OR SWEATING *NAUSEA AND VOMITING THAT IS NOT CONTROLLED WITH YOUR NAUSEA MEDICATION *UNUSUAL SHORTNESS OF BREATH *UNUSUAL BRUISING OR BLEEDING *URINARY PROBLEMS (pain or burning when urinating, or frequent urination) *BOWEL PROBLEMS (unusual diarrhea, constipation, pain near the anus) TENDERNESS IN MOUTH AND THROAT WITH OR WITHOUT PRESENCE OF ULCERS (sore throat, sores in mouth, or a toothache) UNUSUAL RASH, SWELLING OR PAIN  UNUSUAL VAGINAL DISCHARGE OR ITCHING   Items with * indicate a potential emergency and should be followed up as soon as possible or go to the Emergency Department if any problems should occur.  Please show the CHEMOTHERAPY ALERT CARD or IMMUNOTHERAPY ALERT CARD at check-in to the Emergency Department and triage  nurse.  Should you have questions after your visit or need to cancel or reschedule your appointment, please contact [facility]  [phone] and follow the prompts.  Office hours are 8:00 a.m. to 4:30 p.m. Monday - Friday. Please note that voicemails left after 4:00 p.m. may not be returned until the following business day.  We are closed weekends and major holidays. You have access to a nurse at all times for urgent questions. Please call the main number to the clinic [phone] and follow the prompts.  For any non-urgent questions, you may also contact your provider using MyChart. We now offer e-Visits for anyone 52 and older to request care online for non-urgent symptoms. For details visit [email or web address].   Also download the MyChart app! Go to the app store, search "MyChart", open the app, select [facility], and log in with your MyChart username and password.  Due to Covid, a mask is required upon entering the hospital/clinic. If you do not have a mask, one will be given to you upon arrival. For doctor visits, patients may have 1 support person aged 4 or older with them. For treatment visits, patients cannot have anyone with them due to current Covid guidelines and our immunocompromised population.

## 2021-04-11 NOTE — Progress Notes (Signed)
Hematology-Oncology Follow-Up Appointment  Chief Complaint: Follow-up for erythrocytosis   INTERVAL HISTORY Thomas Palmer. is a 80 y.o. male who has above history reviewed by me today presents for follow up visit for secondary erythrocytosis.  Patient has had decreased her testosterone dosage.  He has also had phlebotomy session and tolerated well.  Denies any real symptoms.  He has not felt any different since his phlebotomy.  He has now had 3 phlebotomies-01/04/2021, 01/16/2021 and 02/27/2021.   He presents today with his wife.  Overall he feels well.  Denies any new concerns.  Continues to use 37.5 mg testosterone gel.  Review of Systems  Constitutional:  Positive for malaise/fatigue. Negative for fever and weight loss.  HENT:  Negative for congestion and hearing loss.   Eyes:  Negative for blurred vision and double vision.  Respiratory:  Negative for cough.   Cardiovascular:  Negative for chest pain and palpitations.  Gastrointestinal:  Negative for abdominal pain, constipation, diarrhea, nausea and vomiting.  Genitourinary:  Negative for frequency and urgency.  Skin:  Negative for rash.  Neurological:  Negative for dizziness, tingling and headaches.  Endo/Heme/Allergies:  Does not bruise/bleed easily.  Psychiatric/Behavioral:  Negative for depression. The patient is not nervous/anxious and does not have insomnia.      Past Medical History:  Diagnosis Date   Arthritis    "joints in hips and fingers; shoulders too" (07/13/2013)   Chronic lower back pain    Depression    Erythrocytosis 01/01/2021   H/O hiatal hernia    Hypothyroidism    Pneumonia 08/2011   Past Surgical History:  Procedure Laterality Date   ACHILLES TENDON REPAIR Left 2021   BACK SURGERY     kyphoplasty   CARDIAC CATHETERIZATION  ~ 2009   COLONOSCOPY WITH PROPOFOL N/A 05/03/2019   Procedure: COLONOSCOPY WITH PROPOFOL;  Surgeon: Lollie Sails, MD;  Location: [facility] ENDOSCOPY;  Service: Endoscopy;   Laterality: N/A;   FIXATION KYPHOPLASTY LUMBAR SPINE  ~ 2012   "L1" (07/13/2013)   JOINT REPLACEMENT     LAPAROSCOPIC APPENDECTOMY N/A 02/22/2018   Procedure: APPENDECTOMY LAPAROSCOPIC;  Surgeon: Vickie Epley, MD;  Location: [facility] ORS;  Service: General;  Laterality: N/A;   TOTAL HIP ARTHROPLASTY Left 07/12/2013   Procedure: TOTAL HIP ARTHROPLASTY;  Surgeon: Vickey Huger, MD;  Location: [facility];  Service: Orthopedics;  Laterality: Left;   TOTAL HIP ARTHROPLASTY Right 05/17/2002    Family History  Problem Relation Age of Onset   Skin cancer Father    Stomach cancer Maternal Grandmother     Social History   Socioeconomic History   Marital status: Married    Spouse name: Not on file   Number of children: Not on file   Years of education: Not on file   Highest education level: Not on file  Occupational History   Not on file  Tobacco Use   Smoking status: Former    Pack years: 0.00    Types: Pipe    Quit date: 07/07/1989    Years since quitting: 31.7   Smokeless tobacco: Former    Types: Chew    Quit date: 10/28/1989  Substance and Sexual Activity   Alcohol use: Yes    Alcohol/week: 1.0 standard drink    Types: 1 Cans of beer per week   Drug use: No   Sexual activity: Yes  Other Topics Concern   Not on file  Social History Narrative   Not on file   Social Determinants of  Health   Financial Resource Strain: Not on file  Food Insecurity: Not on file  Transportation Needs: Not on file  Physical Activity: Not on file  Stress: Not on file  Social Connections: Not on file  Intimate Partner Violence: Not on file    Current Outpatient Medications on File Prior to Visit  Medication Sig Dispense Refill   Ascorbic Acid (VITAMIN C) 1000 MG tablet Take 1,000 mg by mouth daily.     Calcium Ascorbate 500 MG TABS      gabapentin (NEURONTIN) 100 MG capsule Take 1 capsule by mouth 2 (two) times daily.     levothyroxine (SYNTHROID, LEVOTHROID) 200 MCG tablet Take 250 mcg by mouth daily  before breakfast.     Lutein 20 MG CAPS Take 1 capsule by mouth daily.     Multiple Vitamins-Minerals (MULTIVITAMIN WITH MINERALS) tablet Take 1 tablet by mouth daily.     tamsulosin (FLOMAX) 0.4 MG CAPS capsule Take by mouth.     testosterone (ANDROGEL) 50 MG/5GM (1%) GEL Please compound     vitamin B-12 (CYANOCOBALAMIN) 100 MCG tablet Take 100 mcg by mouth daily.      No current facility-administered medications on file prior to visit.    Allergies  Allergen Reactions   Iodine Nausea Only    Contrast Dye - causes severe nausea   Contrast Media [Iodinated Diagnostic Agents] Nausea And Vomiting       Observations/Objective: There were no vitals filed for this visit. There is no height or weight on file to calculate BMI.    Physical Exam Vitals and nursing note reviewed.  HENT:     Head: Normocephalic and atraumatic.     Nose: No congestion.     Mouth/Throat:     Mouth: Mucous membranes are moist.     Pharynx: Oropharynx is clear.  Eyes:     General:        Right eye: No discharge.        Left eye: No discharge.     Extraocular Movements: Extraocular movements intact.     Pupils: Pupils are equal, round, and reactive to light.  Cardiovascular:     Rate and Rhythm: Normal rate and regular rhythm.     Pulses: Normal pulses.     Heart sounds: No murmur heard. Pulmonary:     Effort: Pulmonary effort is normal.  Abdominal:     General: Bowel sounds are normal. There is no distension.     Tenderness: There is no abdominal tenderness. There is no guarding.  Musculoskeletal:        General: No swelling or deformity.     Right lower leg: No edema.     Left lower leg: No edema.  Skin:    General: Skin is warm and dry.     Capillary Refill: Capillary refill takes less than 2 seconds.  Neurological:     Mental Status: He is alert and oriented to person, place, and time.  Psychiatric:        Mood and Affect: Mood normal.        Thought Content: Thought content normal.      CBC    Component Value Date/Time   WBC 5.5 01/01/2021 1150   RBC 6.04 (H) 01/01/2021 1150   HGB 17.3 (H) 04/11/2021 1305   HGB 12.6 (L) 01/08/2014 0432   HCT 52.4 (H) 04/11/2021 1305   HCT 39.3 (L) 01/08/2014 0432   PLT 144 (L) 01/01/2021 1150   PLT 155 01/08/2014 0432  MCV 95.2 01/01/2021 1150   MCV 86 01/08/2014 0432   MCH 30.6 01/01/2021 1150   MCHC 32.2 01/01/2021 1150   RDW 13.7 01/01/2021 1150   RDW 14.1 01/08/2014 0432   LYMPHSABS 1.1 01/01/2021 1150   LYMPHSABS 0.6 (L) 01/08/2014 0432   MONOABS 0.6 01/01/2021 1150   MONOABS 0.9 01/08/2014 0432   EOSABS 0.9 (H) 01/01/2021 1150   EOSABS 0.1 01/08/2014 0432   BASOSABS 0.4 (H) 01/01/2021 1150   BASOSABS 0.0 01/08/2014 0432    CMP     Component Value Date/Time   NA 136 01/01/2021 1150   K 4.7 01/01/2021 1150   CL 94 (L) 01/01/2021 1150   CO2 32 01/01/2021 1150   GLUCOSE 98 01/01/2021 1150   BUN 16 01/01/2021 1150   CREATININE 0.99 01/01/2021 1150   CREATININE 0.88 12/28/2012 1203   CALCIUM 8.9 01/01/2021 1150   PROT 7.2 01/01/2021 1150   ALBUMIN 4.1 01/01/2021 1150   AST 28 01/01/2021 1150   ALT 24 01/01/2021 1150   ALKPHOS 34 (L) 01/01/2021 1150   BILITOT 0.8 01/01/2021 1150   GFRNONAA >60 01/01/2021 1150   GFRNONAA >60 12/28/2012 1203   GFRAA >60 07/09/2020 1907   GFRAA >60 12/28/2012 1203     Assessment and Plan: 1. Secondary erythrocytosis     Secondary to testosterone Work-up included JAK2 V617F mutation negative, with reflex to other mutations CALR, MPL, JAK 2 Ex 12-15 mutations negative. BCR ABL FISH negative, normal carbon monoxide level. Has had 3 phlebotomies (01/04/2021, 01/12/2021 and 02/27/2021.). Appears to have tolerated phlebotomies well.  Denies any acute symptoms. Labs from today show hemoglobin 17.3 and hematocrit 52.4. Proceed with phlebotomy.  Disposition: Phlebotomy today. RTC in 6 weeks plus or minus phlebotomy. RTC in 12 weeks for MD assessment, labs (CBC) plus or minus  phlebotomy.  I discussed the assessment and treatment plan with the patient. The patient was provided an opportunity to ask questions and all were answered. The patient agreed with the plan and demonstrated an understanding of the instructions.   The patient was advised to call back or seek an in-person evaluation if the symptoms worsen or if the condition fails to improve as anticipated.    Franki Monte, RN 04/11/2021 1:07 PM   Greater than 50% was spent in counseling and coordination of care with this patient including but not limited to discussion of the relevant topics above (See A&P) including, but not limited to diagnosis and management of acute and chronic medical conditions.   Faythe Casa, NP 04/12/2021 8:24 AM

## 2021-04-12 ENCOUNTER — Encounter: Payer: Self-pay | Admitting: Oncology

## 2021-04-19 DIAGNOSIS — M47816 Spondylosis without myelopathy or radiculopathy, lumbar region: Secondary | ICD-10-CM | POA: Diagnosis not present

## 2021-05-17 ENCOUNTER — Inpatient Hospital Stay: Payer: Medicare HMO | Attending: Oncology

## 2021-05-17 ENCOUNTER — Inpatient Hospital Stay: Payer: Medicare HMO

## 2021-05-17 ENCOUNTER — Other Ambulatory Visit: Payer: Self-pay

## 2021-05-17 VITALS — BP 115/74 | HR 69

## 2021-05-17 DIAGNOSIS — D751 Secondary polycythemia: Secondary | ICD-10-CM

## 2021-05-17 LAB — HEMOGLOBIN AND HEMATOCRIT, BLOOD
HCT: 53 % — ABNORMAL HIGH (ref 39.0–52.0)
Hemoglobin: 17.5 g/dL — ABNORMAL HIGH (ref 13.0–17.0)

## 2021-05-17 NOTE — Patient Instructions (Signed)
[facility] ONCOLOGY  Discharge Instructions: Thank you for choosing [facility] to provide your oncology and hematology care.  If you have a lab appointment with the [facility], please go directly to the [facility] and check in at the registration area.  Wear comfortable clothing and clothing appropriate for easy access to any Portacath or PICC line.   We strive to give you quality time with your provider. You may need to reschedule your appointment if you arrive late (15 or more minutes).  Arriving late affects you and other patients whose appointments are after yours.  Also, if you miss three or more appointments without notifying the office, you may be dismissed from the clinic at the provider's discretion.      For prescription refill requests, have your pharmacy contact our office and allow 72 hours for refills to be completed.    Today you received a phlebotomy    To help prevent nausea and vomiting after your treatment, we encourage you to take your nausea medication as directed.  BELOW ARE SYMPTOMS THAT SHOULD BE REPORTED IMMEDIATELY: *FEVER GREATER THAN 100.4 F (38 C) OR HIGHER *CHILLS OR SWEATING *NAUSEA AND VOMITING THAT IS NOT CONTROLLED WITH YOUR NAUSEA MEDICATION *UNUSUAL SHORTNESS OF BREATH *UNUSUAL BRUISING OR BLEEDING *URINARY PROBLEMS (pain or burning when urinating, or frequent urination) *BOWEL PROBLEMS (unusual diarrhea, constipation, pain near the anus) TENDERNESS IN MOUTH AND THROAT WITH OR WITHOUT PRESENCE OF ULCERS (sore throat, sores in mouth, or a toothache) UNUSUAL RASH, SWELLING OR PAIN  UNUSUAL VAGINAL DISCHARGE OR ITCHING   Items with * indicate a potential emergency and should be followed up as soon as possible or go to the Emergency Department if any problems should occur.  Please show the CHEMOTHERAPY ALERT CARD or IMMUNOTHERAPY ALERT CARD at check-in to the Emergency Department and triage nurse.  Should  you have questions after your visit or need to cancel or reschedule your appointment, please contact [facility]  [phone] and follow the prompts.  Office hours are 8:00 a.m. to 4:30 p.m. Monday - Friday. Please note that voicemails left after 4:00 p.m. may not be returned until the following business day.  We are closed weekends and major holidays. You have access to a nurse at all times for urgent questions. Please call the main number to the clinic [phone] and follow the prompts.  For any non-urgent questions, you may also contact your provider using MyChart. We now offer e-Visits for anyone 50 and older to request care online for non-urgent symptoms. For details visit [email or web address].   Also download the MyChart app! Go to the app store, search "MyChart", open the app, select [facility], and log in with your MyChart username and password.  Due to Covid, a mask is required upon entering the hospital/clinic. If you do not have a mask, one will be given to you upon arrival. For doctor visits, patients may have 1 support person aged 60 or older with them. For treatment visits, patients cannot have anyone with them due to current Covid guidelines and our immunocompromised population.

## 2021-06-14 DIAGNOSIS — R1012 Left upper quadrant pain: Secondary | ICD-10-CM | POA: Diagnosis not present

## 2021-06-14 DIAGNOSIS — D751 Secondary polycythemia: Secondary | ICD-10-CM | POA: Diagnosis not present

## 2021-06-14 DIAGNOSIS — E039 Hypothyroidism, unspecified: Secondary | ICD-10-CM | POA: Diagnosis not present

## 2021-06-14 DIAGNOSIS — M810 Age-related osteoporosis without current pathological fracture: Secondary | ICD-10-CM | POA: Diagnosis not present

## 2021-06-14 DIAGNOSIS — D696 Thrombocytopenia, unspecified: Secondary | ICD-10-CM | POA: Diagnosis not present

## 2021-06-14 DIAGNOSIS — R2689 Other abnormalities of gait and mobility: Secondary | ICD-10-CM | POA: Diagnosis not present

## 2021-06-14 DIAGNOSIS — N289 Disorder of kidney and ureter, unspecified: Secondary | ICD-10-CM | POA: Diagnosis not present

## 2021-06-14 DIAGNOSIS — K5792 Diverticulitis of intestine, part unspecified, without perforation or abscess without bleeding: Secondary | ICD-10-CM | POA: Diagnosis not present

## 2021-06-14 DIAGNOSIS — G5793 Unspecified mononeuropathy of bilateral lower limbs: Secondary | ICD-10-CM | POA: Diagnosis not present

## 2021-06-21 DIAGNOSIS — G5793 Unspecified mononeuropathy of bilateral lower limbs: Secondary | ICD-10-CM | POA: Diagnosis not present

## 2021-06-21 DIAGNOSIS — Z79899 Other long term (current) drug therapy: Secondary | ICD-10-CM | POA: Diagnosis not present

## 2021-06-21 DIAGNOSIS — E039 Hypothyroidism, unspecified: Secondary | ICD-10-CM | POA: Diagnosis not present

## 2021-06-21 DIAGNOSIS — N529 Male erectile dysfunction, unspecified: Secondary | ICD-10-CM | POA: Diagnosis not present

## 2021-06-21 DIAGNOSIS — D751 Secondary polycythemia: Secondary | ICD-10-CM | POA: Diagnosis not present

## 2021-06-21 DIAGNOSIS — R2689 Other abnormalities of gait and mobility: Secondary | ICD-10-CM | POA: Diagnosis not present

## 2021-07-03 ENCOUNTER — Inpatient Hospital Stay (HOSPITAL_BASED_OUTPATIENT_CLINIC_OR_DEPARTMENT_OTHER): Payer: Medicare HMO | Admitting: Oncology

## 2021-07-03 ENCOUNTER — Inpatient Hospital Stay: Payer: Medicare HMO

## 2021-07-03 ENCOUNTER — Inpatient Hospital Stay: Payer: Medicare HMO | Attending: Oncology

## 2021-07-03 VITALS — BP 120/65 | HR 65

## 2021-07-03 VITALS — BP 145/73 | HR 67 | Temp 97.4°F | Wt 198.2 lb

## 2021-07-03 DIAGNOSIS — D751 Secondary polycythemia: Secondary | ICD-10-CM | POA: Insufficient documentation

## 2021-07-03 DIAGNOSIS — E039 Hypothyroidism, unspecified: Secondary | ICD-10-CM | POA: Insufficient documentation

## 2021-07-03 LAB — CBC WITH DIFFERENTIAL/PLATELET
Abs Immature Granulocytes: 0.03 10*3/uL (ref 0.00–0.07)
Basophils Absolute: 0 10*3/uL (ref 0.0–0.1)
Basophils Relative: 1 %
Eosinophils Absolute: 0.3 10*3/uL (ref 0.0–0.5)
Eosinophils Relative: 4 %
HCT: 51.2 % (ref 39.0–52.0)
Hemoglobin: 16.7 g/dL (ref 13.0–17.0)
Immature Granulocytes: 1 %
Lymphocytes Relative: 18 %
Lymphs Abs: 1.1 10*3/uL (ref 0.7–4.0)
MCH: 30.1 pg (ref 26.0–34.0)
MCHC: 32.6 g/dL (ref 30.0–36.0)
MCV: 92.4 fL (ref 80.0–100.0)
Monocytes Absolute: 0.6 10*3/uL (ref 0.1–1.0)
Monocytes Relative: 10 %
Neutro Abs: 4.1 10*3/uL (ref 1.7–7.7)
Neutrophils Relative %: 66 %
Platelets: 151 10*3/uL (ref 150–400)
RBC: 5.54 MIL/uL (ref 4.22–5.81)
RDW: 12.2 % (ref 11.5–15.5)
WBC: 6.1 10*3/uL (ref 4.0–10.5)
nRBC: 0 % (ref 0.0–0.2)

## 2021-07-03 NOTE — Progress Notes (Signed)
Hematology-Oncology Follow-Up Appointment  Chief Complaint: Follow-up for erythrocytosis  HPI- Thomas Palmer. is a 80 y.o. male who was seen in consultation at the request of Tracie Harrier, MD for evaluation of polycytosis/erythrocytosis Reviewed recent labs done at the primary care provider's office. 12/14/2020, hemoglobin 19.4, hematocrit 59.2, MCV 94.7, WBC 5.6, Chronic Onset, intermittent, duration since 2020. No aggravating or alleviating factors.    Associated signs or symptoms: Denies weight loss, fever, chills, fatigue, night sweats.    Context:  Smoking history: Former smoker. Testosterone supplements: Patient is on testosterone cream. History of blood clots: Denies Daytime somnolence: Denies Family history of polycythemia: Denies  INTERVAL HISTORY Thomas Palmer. is a 80 y.o. male who has above history reviewed by me today presents for follow up visit for secondary erythrocytosis d/t testosterone injections. He is status post several phlebotomies last on 05/17/2021 for hematocrit greater than 50.  Continues to use 37.5 mg testosterone gel which is decreased from prior.   In the interim, he was seen by his PCP for neuropathy, poor balance, back pain and diverticulosis follow-up.  He receives epidural injections for low back pain by Dr. Davy Pique. Chronic conditions appear stable.   Today, he reports feeling well.  Denies any new concerns.  Feels he is doing well for his age.  Review of Systems  Constitutional:  Positive for malaise/fatigue. Negative for chills, fever and weight loss.  HENT:  Negative for congestion, ear pain and tinnitus.   Eyes: Negative.  Negative for blurred vision and double vision.  Respiratory: Negative.  Negative for cough, sputum production and shortness of breath.   Cardiovascular: Negative.  Negative for chest pain, palpitations and leg swelling.  Gastrointestinal: Negative.  Negative for abdominal pain, constipation,  diarrhea, nausea and vomiting.  Genitourinary:  Negative for dysuria, frequency and urgency.  Musculoskeletal:  Positive for back pain. Negative for falls.  Skin: Negative.  Negative for rash.  Neurological: Negative.  Negative for weakness and headaches.  Endo/Heme/Allergies: Negative.  Does not bruise/bleed easily.  Psychiatric/Behavioral: Negative.  Negative for depression. The patient is not nervous/anxious and does not have insomnia.      Past Medical History:  Diagnosis Date   Arthritis    "joints in hips and fingers; shoulders too" (07/13/2013)   Chronic lower back pain    Depression    Erythrocytosis 01/01/2021   H/O hiatal hernia    Hypothyroidism    Pneumonia 08/2011   Past Surgical History:  Procedure Laterality Date   ACHILLES TENDON REPAIR Left 2021   BACK SURGERY     kyphoplasty   CARDIAC CATHETERIZATION  ~ 2009   COLONOSCOPY WITH PROPOFOL N/A 05/03/2019   Procedure: COLONOSCOPY WITH PROPOFOL;  Surgeon: Lollie Sails, MD;  Location: [facility] ENDOSCOPY;  Service: Endoscopy;  Laterality: N/A;   FIXATION KYPHOPLASTY LUMBAR SPINE  ~ 2012   "L1" (07/13/2013)   JOINT REPLACEMENT     LAPAROSCOPIC APPENDECTOMY N/A 02/22/2018   Procedure: APPENDECTOMY LAPAROSCOPIC;  Surgeon: Vickie Epley, MD;  Location: [facility] ORS;  Service: General;  Laterality: N/A;   TOTAL HIP ARTHROPLASTY Left 07/12/2013   Procedure: TOTAL HIP ARTHROPLASTY;  Surgeon: Vickey Huger, MD;  Location: [facility];  Service: Orthopedics;  Laterality: Left;   TOTAL HIP ARTHROPLASTY Right 05/17/2002    Family History  Problem Relation Age of Onset   Skin cancer Father    Stomach cancer Maternal Grandmother     Social History   Socioeconomic History   Marital status: Married  Spouse name: Not on file   Number of children: Not on file   Years of education: Not on file   Highest education level: Not on file  Occupational History   Not on file  Tobacco Use   Smoking status: Former    Types: Pipe    Quit date:  07/07/1989    Years since quitting: 32.0   Smokeless tobacco: Former    Types: Chew    Quit date: 10/28/1989  Substance and Sexual Activity   Alcohol use: Yes    Alcohol/week: 1.0 standard drink    Types: 1 Cans of beer per week   Drug use: No   Sexual activity: Yes  Other Topics Concern   Not on file  Social History Narrative   Not on file   Social Determinants of Health   Financial Resource Strain: Not on file  Food Insecurity: Not on file  Transportation Needs: Not on file  Physical Activity: Not on file  Stress: Not on file  Social Connections: Not on file  Intimate Partner Violence: Not on file    Current Outpatient Medications on File Prior to Visit  Medication Sig Dispense Refill   Ascorbic Acid (VITAMIN C) 1000 MG tablet Take 1,000 mg by mouth daily.     Calcium Ascorbate 500 MG TABS      gabapentin (NEURONTIN) 100 MG capsule Take 1 capsule by mouth 2 (two) times daily.     levothyroxine (SYNTHROID) 50 MCG tablet Take by mouth.     levothyroxine (SYNTHROID, LEVOTHROID) 200 MCG tablet Take 250 mcg by mouth daily before breakfast.     Lutein 20 MG CAPS Take 1 capsule by mouth daily.     Multiple Vitamins-Minerals (MULTIVITAMIN WITH MINERALS) tablet Take 1 tablet by mouth daily.     testosterone (ANDROGEL) 50 MG/5GM (1%) GEL Please compound     vitamin B-12 (CYANOCOBALAMIN) 100 MCG tablet Take 100 mcg by mouth daily.      No current facility-administered medications on file prior to visit.    Allergies  Allergen Reactions   Iodine Nausea Only    Contrast Dye - causes severe nausea   Contrast Media [Iodinated Diagnostic Agents] Nausea And Vomiting       Observations/Objective: Today's Vitals   07/03/21 1346  BP: (!) 145/73  Pulse: 67  Temp: (!) 97.4 F (36.3 C)  TempSrc: Tympanic  SpO2: 94%  Weight: 198 lb 3.2 oz (89.9 kg)   Body mass index is 27.64 kg/m.    Physical Exam Constitutional:      Appearance: Normal appearance.  HENT:     Head:  Normocephalic and atraumatic.  Eyes:     Pupils: Pupils are equal, round, and reactive to light.  Cardiovascular:     Rate and Rhythm: Normal rate and regular rhythm.     Heart sounds: Normal heart sounds. No murmur heard. Pulmonary:     Effort: Pulmonary effort is normal.     Breath sounds: Normal breath sounds. No wheezing.  Abdominal:     General: Bowel sounds are normal. There is no distension.     Palpations: Abdomen is soft.     Tenderness: There is no abdominal tenderness.  Musculoskeletal:        General: Normal range of motion.     Cervical back: Normal range of motion.  Skin:    General: Skin is warm and dry.     Findings: No rash.  Neurological:     Mental Status: He is alert and  oriented to person, place, and time.  Psychiatric:        Judgment: Judgment normal.     CBC    Component Value Date/Time   WBC 6.1 07/03/2021 1327   RBC 5.54 07/03/2021 1327   HGB 16.7 07/03/2021 1327   HGB 12.6 (L) 01/08/2014 0432   HCT 51.2 07/03/2021 1327   HCT 39.3 (L) 01/08/2014 0432   PLT 151 07/03/2021 1327   PLT 155 01/08/2014 0432   MCV 92.4 07/03/2021 1327   MCV 86 01/08/2014 0432   MCH 30.1 07/03/2021 1327   MCHC 32.6 07/03/2021 1327   RDW 12.2 07/03/2021 1327   RDW 14.1 01/08/2014 0432   LYMPHSABS 1.1 07/03/2021 1327   LYMPHSABS 0.6 (L) 01/08/2014 0432   MONOABS 0.6 07/03/2021 1327   MONOABS 0.9 01/08/2014 0432   EOSABS 0.3 07/03/2021 1327   EOSABS 0.1 01/08/2014 0432   BASOSABS 0.0 07/03/2021 1327   BASOSABS 0.0 01/08/2014 0432    CMP     Component Value Date/Time   NA 136 01/01/2021 1150   K 4.7 01/01/2021 1150   CL 94 (L) 01/01/2021 1150   CO2 32 01/01/2021 1150   GLUCOSE 98 01/01/2021 1150   BUN 16 01/01/2021 1150   CREATININE 0.99 01/01/2021 1150   CREATININE 0.88 12/28/2012 1203   CALCIUM 8.9 01/01/2021 1150   PROT 7.2 01/01/2021 1150   ALBUMIN 4.1 01/01/2021 1150   AST 28 01/01/2021 1150   ALT 24 01/01/2021 1150   ALKPHOS 34 (L) 01/01/2021  1150   BILITOT 0.8 01/01/2021 1150   GFRNONAA >60 01/01/2021 1150   GFRNONAA >60 12/28/2012 1203   GFRAA >60 07/09/2020 1907   GFRAA >60 12/28/2012 1203     Assessment and Plan: 1. Erythrocytosis     Erythrocytosis- Secondary to testosterone.  Previous work-up included Jak 2 V6 17 F mutation with reflex which was negative.  BCR able FISH was negative and he had a normal carbon monoxide level.  He is status post several phlebotomies last given on 05/17/2021.  Labs from today show a hematocrit of 51.2 and hemoglobin of 16.7.  Proceed with phlebotomy.  Plan is to have him return to clinic every 6 weeks plus or minus a phlebotomy.  See md back in 12 weeks for lab work (cbc) and possible phlebotomy.  Disposition: Phlebotomy today. RTC in 6 weeks plus or minus phlebotomy.RTC in 12 weeks for MD assessment, labs (CBC) plus or minus phlebotomy.  I spent 20 minutes dedicated to the care of this patient (face-to-face and non-face-to-face) on the date of the encounter to include what is described in the assessment and plan.  I discussed the assessment and treatment plan with the patient. The patient was provided an opportunity to ask questions and all were answered. The patient agreed with the plan and demonstrated an understanding of the instructions.   The patient was advised to call back or seek an in-person evaluation if the symptoms worsen or if the condition fails to improve as anticipated.    Jacquelin Hawking, NP 07/03/2021 2:16 PM

## 2021-07-03 NOTE — Progress Notes (Signed)
526m therapeutic phlebotomy performed per orders. Tolerated well, VSS. Discharged in stable condition.

## 2021-07-03 NOTE — Patient Instructions (Signed)

## 2021-07-30 DIAGNOSIS — M47816 Spondylosis without myelopathy or radiculopathy, lumbar region: Secondary | ICD-10-CM | POA: Diagnosis not present

## 2021-08-14 ENCOUNTER — Other Ambulatory Visit: Payer: Self-pay

## 2021-08-14 ENCOUNTER — Inpatient Hospital Stay: Payer: Medicare HMO | Attending: Oncology

## 2021-08-14 ENCOUNTER — Inpatient Hospital Stay: Payer: Medicare HMO

## 2021-08-14 VITALS — BP 144/78 | HR 67 | Temp 98.4°F | Resp 18

## 2021-08-14 DIAGNOSIS — D751 Secondary polycythemia: Secondary | ICD-10-CM

## 2021-08-14 LAB — HEMOGLOBIN AND HEMATOCRIT, BLOOD
HCT: 50.4 % (ref 39.0–52.0)
Hemoglobin: 16 g/dL (ref 13.0–17.0)

## 2021-08-14 NOTE — Progress Notes (Signed)
500 ml blood removed per phlebotomy orders. Pt tolerated procedure well. Accepted cookies and a beverage. VSS. Discharged to home.

## 2021-08-14 NOTE — Patient Instructions (Signed)

## 2021-08-22 DIAGNOSIS — R030 Elevated blood-pressure reading, without diagnosis of hypertension: Secondary | ICD-10-CM | POA: Diagnosis not present

## 2021-08-22 DIAGNOSIS — M47816 Spondylosis without myelopathy or radiculopathy, lumbar region: Secondary | ICD-10-CM | POA: Diagnosis not present

## 2021-08-22 DIAGNOSIS — Z6826 Body mass index (BMI) 26.0-26.9, adult: Secondary | ICD-10-CM | POA: Diagnosis not present

## 2021-09-24 ENCOUNTER — Other Ambulatory Visit: Payer: Self-pay | Admitting: *Deleted

## 2021-09-24 DIAGNOSIS — D751 Secondary polycythemia: Secondary | ICD-10-CM

## 2021-09-24 DIAGNOSIS — D696 Thrombocytopenia, unspecified: Secondary | ICD-10-CM

## 2021-09-25 ENCOUNTER — Other Ambulatory Visit: Payer: Self-pay

## 2021-09-25 ENCOUNTER — Inpatient Hospital Stay: Payer: Medicare HMO | Admitting: Oncology

## 2021-09-25 ENCOUNTER — Encounter: Payer: Self-pay | Admitting: Oncology

## 2021-09-25 ENCOUNTER — Inpatient Hospital Stay: Payer: Medicare HMO | Attending: Oncology

## 2021-09-25 ENCOUNTER — Inpatient Hospital Stay: Payer: Medicare HMO

## 2021-09-25 VITALS — BP 118/72 | HR 73 | Temp 97.4°F | Resp 18 | Wt 193.6 lb

## 2021-09-25 DIAGNOSIS — Z87891 Personal history of nicotine dependence: Secondary | ICD-10-CM | POA: Diagnosis not present

## 2021-09-25 DIAGNOSIS — Z79890 Hormone replacement therapy (postmenopausal): Secondary | ICD-10-CM

## 2021-09-25 DIAGNOSIS — D751 Secondary polycythemia: Secondary | ICD-10-CM | POA: Diagnosis not present

## 2021-09-25 DIAGNOSIS — D696 Thrombocytopenia, unspecified: Secondary | ICD-10-CM

## 2021-09-25 LAB — CBC WITH DIFFERENTIAL/PLATELET
Abs Immature Granulocytes: 0.05 10*3/uL (ref 0.00–0.07)
Basophils Absolute: 0.1 10*3/uL (ref 0.0–0.1)
Basophils Relative: 1 %
Eosinophils Absolute: 0.1 10*3/uL (ref 0.0–0.5)
Eosinophils Relative: 1 %
HCT: 48.3 % (ref 39.0–52.0)
Hemoglobin: 15.5 g/dL (ref 13.0–17.0)
Immature Granulocytes: 1 %
Lymphocytes Relative: 15 %
Lymphs Abs: 1.1 10*3/uL (ref 0.7–4.0)
MCH: 29.3 pg (ref 26.0–34.0)
MCHC: 32.1 g/dL (ref 30.0–36.0)
MCV: 91.3 fL (ref 80.0–100.0)
Monocytes Absolute: 0.4 10*3/uL (ref 0.1–1.0)
Monocytes Relative: 6 %
Neutro Abs: 5.3 10*3/uL (ref 1.7–7.7)
Neutrophils Relative %: 76 %
Platelets: 152 10*3/uL (ref 150–400)
RBC: 5.29 MIL/uL (ref 4.22–5.81)
RDW: 13.2 % (ref 11.5–15.5)
WBC: 6.9 10*3/uL (ref 4.0–10.5)
nRBC: 0 % (ref 0.0–0.2)

## 2021-09-25 NOTE — Progress Notes (Signed)
Hematology/Oncology progress note [phone] [phone]   Patient Care Team: Tracie Harrier, MD as PCP - General (Internal Medicine)  REFERRING PROVIDER: Tracie Harrier, MD  CHIEF COMPLAINTS/REASON FOR VISIT:  Secondary erythrocytosis  HISTORY OF PRESENTING ILLNESS:  Thomas Palmer. is a 80 y.o. male who was seen in consultation at the request of Tracie Harrier, MD for evaluation of polycytosis/erythrocytosis Reviewed recent labs done at the primary care provider's office. 12/14/2020, hemoglobin 19.4, hematocrit 59.2, MCV 94.7, WBC 5.6, Chronic Onset, intermittent, duration since 2020. No aggravating or alleviating factors.   Associated signs or symptoms: Denies weight loss, fever, chills, fatigue, night sweats.   Context:  Smoking history: Former smoker. Testosterone supplements: Patient is on testosterone cream. History of blood clots: Denies Daytime somnolence: Denies Family history of polycythemia: Denies  INTERVAL HISTORY Thomas Palmer. is a 80 y.o. male who has above history reviewed by me today presents for follow up visit for secondary erythrocytosis. Patient reports feeling well. He is on phlebotomy program if hematocrit is above 50.  He is on testosterone replacement therapy.   Review of Systems  Constitutional:  Negative for appetite change, chills, fatigue, fever and unexpected weight change.  HENT:   Negative for hearing loss and voice change.   Eyes:  Negative for eye problems and icterus.  Respiratory:  Negative for chest tightness, cough and shortness of breath.   Cardiovascular:  Negative for chest pain and leg swelling.  Gastrointestinal:  Negative for abdominal distention and abdominal pain.  Endocrine: Negative for hot flashes.  Genitourinary:  Negative for difficulty urinating, dysuria and frequency.   Musculoskeletal:  Negative for arthralgias.  Skin:  Negative for itching and rash.   Neurological:  Negative for light-headedness and numbness.  Hematological:  Negative for adenopathy. Does not bruise/bleed easily.  Psychiatric/Behavioral:  Negative for confusion.    MEDICAL HISTORY:  Past Medical History:  Diagnosis Date   Arthritis    "joints in hips and fingers; shoulders too" (07/13/2013)   Chronic lower back pain    Depression    Erythrocytosis 01/01/2021   H/O hiatal hernia    Hypothyroidism    Pneumonia 08/2011    SURGICAL HISTORY: Past Surgical History:  Procedure Laterality Date   ACHILLES TENDON REPAIR Left 2021   BACK SURGERY     kyphoplasty   CARDIAC CATHETERIZATION  ~ 2009   COLONOSCOPY WITH PROPOFOL N/A 05/03/2019   Procedure: COLONOSCOPY WITH PROPOFOL;  Surgeon: Lollie Sails, MD;  Location: [facility] ENDOSCOPY;  Service: Endoscopy;  Laterality: N/A;   FIXATION KYPHOPLASTY LUMBAR SPINE  ~ 2012   "L1" (07/13/2013)   JOINT REPLACEMENT     LAPAROSCOPIC APPENDECTOMY N/A 02/22/2018   Procedure: APPENDECTOMY LAPAROSCOPIC;  Surgeon: Vickie Epley, MD;  Location: [facility] ORS;  Service: General;  Laterality: N/A;   TOTAL HIP ARTHROPLASTY Left 07/12/2013   Procedure: TOTAL HIP ARTHROPLASTY;  Surgeon: Vickey Huger, MD;  Location: [place];  Service: Orthopedics;  Laterality: Left;   TOTAL HIP ARTHROPLASTY Right 05/17/2002    SOCIAL HISTORY: Social History   Socioeconomic History   Marital status: Married    Spouse name: Not on file   Number of children: Not on file   Years of education: Not on file   Highest education level: Not on file  Occupational History   Not on file  Tobacco Use   Smoking status: Former    Types: Pipe    Quit date: 07/07/1989    Years since quitting: 8.2  Smokeless tobacco: Former    Types: Chew    Quit date: 10/28/1989  Substance and Sexual Activity   Alcohol use: Yes    Alcohol/week: 1.0 standard drink    Types: 1 Cans of beer per week   Drug use: No   Sexual activity: Yes  Other Topics Concern   Not on file  Social  History Narrative   Not on file   Social Determinants of Health   Financial Resource Strain: Not on file  Food Insecurity: Not on file  Transportation Needs: Not on file  Physical Activity: Not on file  Stress: Not on file  Social Connections: Not on file  Intimate Partner Violence: Not on file    FAMILY HISTORY: Family History  Problem Relation Age of Onset   Skin cancer Father    Stomach cancer Maternal Grandmother     ALLERGIES:  is allergic to iodine and contrast media [iodinated diagnostic agents].  MEDICATIONS:  Current Outpatient Medications  Medication Sig Dispense Refill   Ascorbic Acid (VITAMIN C) 1000 MG tablet Take 1,000 mg by mouth daily.     Calcium Ascorbate 500 MG TABS      gabapentin (NEURONTIN) 100 MG capsule Take 1 capsule by mouth 2 (two) times daily.     levothyroxine (SYNTHROID) 50 MCG tablet Take by mouth.     levothyroxine (SYNTHROID, LEVOTHROID) 200 MCG tablet Take 250 mcg by mouth daily before breakfast.     Lutein 20 MG CAPS Take 1 capsule by mouth daily.     Multiple Vitamins-Minerals (MULTIVITAMIN WITH MINERALS) tablet Take 1 tablet by mouth daily.     tamsulosin (FLOMAX) 0.4 MG CAPS capsule      testosterone (ANDROGEL) 50 MG/5GM (1%) GEL Please compound     vitamin B-12 (CYANOCOBALAMIN) 100 MCG tablet Take 100 mcg by mouth daily.      No current facility-administered medications for this visit.     PHYSICAL EXAMINATION: ECOG PERFORMANCE STATUS: 1 - Symptomatic but completely ambulatory Vitals:   09/25/21 1314  BP: 118/72  Pulse: 73  Resp: 18  Temp: (!) 97.4 F (36.3 C)   Filed Weights   09/25/21 1314  Weight: 193 lb 9.6 oz (87.8 kg)    Physical Exam Constitutional:      General: He is not in acute distress. HENT:     Head: Normocephalic and atraumatic.  Eyes:     General: No scleral icterus. Cardiovascular:     Rate and Rhythm: Normal rate and regular rhythm.     Heart sounds: Normal heart sounds.  Pulmonary:     Effort:  Pulmonary effort is normal. No respiratory distress.     Breath sounds: No wheezing.  Abdominal:     General: Bowel sounds are normal. There is no distension.     Palpations: Abdomen is soft.  Musculoskeletal:        General: No deformity. Normal range of motion.     Cervical back: Normal range of motion and neck supple.  Skin:    General: Skin is warm and dry.     Findings: No erythema or rash.  Neurological:     Mental Status: He is alert and oriented to person, place, and time. Mental status is at baseline.     Cranial Nerves: No cranial nerve deficit.     Coordination: Coordination normal.  Psychiatric:        Mood and Affect: Mood normal.    RADIOGRAPHIC STUDIES: I have personally reviewed the radiological images as listed  and agreed with the findings in the report. No results found.   LABORATORY DATA:  I have reviewed the data as listed Lab Results  Component Value Date   WBC 6.9 09/25/2021   HGB 15.5 09/25/2021   HCT 48.3 09/25/2021   MCV 91.3 09/25/2021   PLT 152 09/25/2021   Recent Labs    01/01/21 1150  NA 136  K 4.7  CL 94*  CO2 32  GLUCOSE 98  BUN 16  CREATININE 0.99  CALCIUM 8.9  GFRNONAA >60  PROT 7.2  ALBUMIN 4.1  AST 28  ALT 24  ALKPHOS 34*  BILITOT 0.8    Iron/TIBC/Ferritin/ %Sat No results found for: IRON, TIBC, FERRITIN, IRONPCTSAT      ASSESSMENT & PLAN:  1. Long-term current use of testosterone replacement therapy   2. Secondary erythrocytosis    Secondary erythrocytosis due to testosterone use. Labs reviewed and discussed with patient Hematocrit is less than 50. No need for phlebotomy today. Repeat H&H in 8 weeks +/- phlebotomy if hematocrit is above 50. Follow-up in 4 months lab MD phlebotomy    Orders Placed This Encounter  Procedures   Hemoglobin and hematocrit, blood    Standing Status:   Future    Standing Expiration Date:   09/25/2022   CBC with Differential/Platelet    Standing Status:   Future    Standing  Expiration Date:   09/25/2022    We spent sufficient time to discuss many aspect of care, questions were answered to patient's satisfaction. The patient knows to call the clinic with any problems questions or concerns.  Cc Tracie Harrier, MD  Earlie Server, MD, PhD 09/25/2021

## 2021-09-25 NOTE — Progress Notes (Signed)
Pt here for follow up. No new concerns voiced.   

## 2021-09-27 ENCOUNTER — Ambulatory Visit: Payer: Medicare HMO | Admitting: Dermatology

## 2021-09-27 ENCOUNTER — Other Ambulatory Visit: Payer: Self-pay

## 2021-09-27 DIAGNOSIS — L720 Epidermal cyst: Secondary | ICD-10-CM | POA: Diagnosis not present

## 2021-09-27 DIAGNOSIS — T69021A Immersion foot, right foot, initial encounter: Secondary | ICD-10-CM

## 2021-09-27 DIAGNOSIS — T69022A Immersion foot, left foot, initial encounter: Secondary | ICD-10-CM

## 2021-09-27 DIAGNOSIS — L570 Actinic keratosis: Secondary | ICD-10-CM

## 2021-09-27 DIAGNOSIS — L578 Other skin changes due to chronic exposure to nonionizing radiation: Secondary | ICD-10-CM

## 2021-09-27 DIAGNOSIS — Z1283 Encounter for screening for malignant neoplasm of skin: Secondary | ICD-10-CM | POA: Diagnosis not present

## 2021-09-27 DIAGNOSIS — B351 Tinea unguium: Secondary | ICD-10-CM | POA: Diagnosis not present

## 2021-09-27 DIAGNOSIS — D485 Neoplasm of uncertain behavior of skin: Secondary | ICD-10-CM

## 2021-09-27 DIAGNOSIS — L820 Inflamed seborrheic keratosis: Secondary | ICD-10-CM

## 2021-09-27 DIAGNOSIS — B353 Tinea pedis: Secondary | ICD-10-CM | POA: Diagnosis not present

## 2021-09-27 DIAGNOSIS — D1801 Hemangioma of skin and subcutaneous tissue: Secondary | ICD-10-CM

## 2021-09-27 DIAGNOSIS — D229 Melanocytic nevi, unspecified: Secondary | ICD-10-CM

## 2021-09-27 DIAGNOSIS — L814 Other melanin hyperpigmentation: Secondary | ICD-10-CM

## 2021-09-27 DIAGNOSIS — L821 Other seborrheic keratosis: Secondary | ICD-10-CM

## 2021-09-27 MED ORDER — CICLOPIROX OLAMINE 0.77 % EX CREA: TOPICAL_CREAM | CUTANEOUS | 5 refills | Status: DC

## 2021-09-27 NOTE — Progress Notes (Signed)
Follow-Up Visit   Subjective  Thomas Palmer. is a 80 y.o. male who presents for the following: FBSE (Patient here for full body skin exam and skin cancer screening. Patient does not have a hx of skin cancer or abnormal nevi. He does have some spots at temples that are irritated. ).  The following portions of the chart were reviewed this encounter and updated as appropriate:   Tobacco  Allergies  Meds  Problems  Med Hx  Surg Hx  Fam Hx      Review of Systems:  No other skin or systemic complaints except as noted in HPI or Assessment and Plan.  Objective  Well appearing patient in no apparent distress; mood and affect are within normal limits.  A full examination was performed including scalp, head, eyes, ears, nose, lips, neck, chest, axillae, abdomen, back, buttocks, bilateral upper extremities, bilateral lower extremities, hands, feet, fingers, toes, fingernails, and toenails. All findings within normal limits unless otherwise noted below.  Left Hip Small subcutaneous nodule.   Left Forehead x 1, left angle of mandible x 1, left mid back x 1 (3) Erythematous thin papules/macules with gritty scale.   Right Temple x 1, right concha bowl x 1, left chest x 1 (3) Erythematous keratotic or waxy stuck-on papule or plaque.   Right Shoulder 0.3 cm irregular dark brown macule R/o Atypia     bilateral feet Scaling and maceration web spaces and over distal and lateral soles.    Assessment & Plan  Epidermal inclusion cyst Left Hip  Benign-appearing. Exam most consistent with an epidermal inclusion cyst. Discussed that a cyst is a benign growth that can grow over time and sometimes get irritated or inflamed. Recommend observation if it is not bothersome. Discussed option of surgical excision to remove it if it is growing, symptomatic, or other changes noted. Please call for new or changing lesions so they can be evaluated.     AK (actinic keratosis) (3) Left  Forehead x 1, left angle of mandible x 1, left mid back x 1  Prior to procedure, discussed risks of blister formation, small wound, skin dyspigmentation, or rare scar following cryotherapy. Recommend Vaseline ointment to treated areas while healing.  Actinic keratoses are precancerous spots that appear secondary to cumulative UV radiation exposure/sun exposure over time. They are chronic with expected duration over 1 year. A portion of actinic keratoses will progress to squamous cell carcinoma of the skin. It is not possible to reliably predict which spots will progress to skin cancer and so treatment is recommended to prevent development of skin cancer.  Recommend daily broad spectrum sunscreen SPF 30+ to sun-exposed areas, reapply every 2 hours as needed.  Recommend staying in the shade or wearing long sleeves, sun glasses (UVA+UVB protection) and wide brim hats (4-inch brim around the entire circumference of the hat). Call for new or changing lesions.    Destruction of lesion - Left Forehead x 1, left angle of mandible x 1, left mid back x 1  Destruction method: cryotherapy   Informed consent: discussed and consent obtained   Lesion destroyed using liquid nitrogen: Yes   Cryotherapy cycles:  2 Outcome: patient tolerated procedure well with no complications   Post-procedure details: wound care instructions given    Inflamed seborrheic keratosis Right Temple x 1, right concha bowl x 1, left chest x 1  Prior to procedure, discussed risks of blister formation, small wound, skin dyspigmentation, or rare scar following cryotherapy. Recommend Vaseline ointment  to treated areas while healing.  Vs AK at right concha bowl, left chest   Destruction of lesion - Right Temple x 1, right concha bowl x 1, left chest x 1  Destruction method: cryotherapy   Informed consent: discussed and consent obtained   Lesion destroyed using liquid nitrogen: Yes   Cryotherapy cycles:  2 Outcome: patient  tolerated procedure well with no complications   Post-procedure details: wound care instructions given    Neoplasm of uncertain behavior of skin Right Shoulder  Epidermal / dermal shaving  Lesion diameter (cm):  0.3 Informed consent: discussed and consent obtained   Timeout: patient name, date of birth, surgical site, and procedure verified   Patient was prepped and draped in usual sterile fashion: area prepped with isopropyl alcohol. Anesthesia: the lesion was anesthetized in a standard fashion   Anesthetic:  1% lidocaine w/ epinephrine 1-100,000 buffered w/ 8.4% NaHCO3 Instrument used: flexible razor blade   Hemostasis achieved with: aluminum chloride   Outcome: patient tolerated procedure well   Post-procedure details: wound care instructions given   Additional details:  Mupirocin and a bandage applied  Specimen 1 - Surgical pathology Differential Diagnosis: R/o Atypia  Check Margins: No 0.3 cm irregular dark brown macule   Tinea pedis of both feet bilateral feet  With onychomycosis With maceration between toes increasing risk of developing bacterial infection  Chronic condition with duration or expected duration over one year. Condition is bothersome to patient. Not currently at goal.  Start ciclopirox twice daily to feet and in between toes for 1 month then once weekly for maintenance to prevent recurrence of tinea pedis  Defer treatment of onychomycosis at this time  ciclopirox (LOPROX) 0.77 % cream - bilateral feet Twice daily all over feet and in between toes for 1 month then once weekly for maintenance.  Lentigines - Scattered tan macules - Due to sun exposure - Benign-appearing, observe - Recommend daily broad spectrum sunscreen SPF 30+ to sun-exposed areas, reapply every 2 hours as needed. - Call for any changes  Seborrheic Keratoses - Stuck-on, waxy, tan-brown papules and/or plaques  - Benign-appearing - Discussed benign etiology and prognosis. -  Observe - Call for any changes  Melanocytic Nevi - Tan-brown and/or pink-flesh-colored symmetric macules and papules - Benign appearing on exam today - Observation - Call clinic for new or changing moles - Recommend daily use of broad spectrum spf 30+ sunscreen to sun-exposed areas.   Hemangiomas - Red papules - Discussed benign nature - Observe - Call for any changes  Actinic Damage - Chronic condition, secondary to cumulative UV/sun exposure - diffuse scaly erythematous macules with underlying dyspigmentation - Recommend daily broad spectrum sunscreen SPF 30+ to sun-exposed areas, reapply every 2 hours as needed.  - Staying in the shade or wearing long sleeves, sun glasses (UVA+UVB protection) and wide brim hats (4-inch brim around the entire circumference of the hat) are also recommended for sun protection.  - Call for new or changing lesions.  Skin cancer screening performed today.  Return in about 1 year (around 09/27/2022) for TBSE, 2-3 month AK, ISK and tinea follow up.  Graciella Belton, RMA, am acting as scribe for Forest Gleason, MD .  Documentation: I have reviewed the above documentation for accuracy and completeness, and I agree with the above.  Forest Gleason, MD

## 2021-09-27 NOTE — Patient Instructions (Addendum)
Cryotherapy Aftercare  Wash gently with soap and water everyday.   Apply Vaseline and Band-Aid daily until healed.   Prior to procedure, discussed risks of blister formation, small wound, skin dyspigmentation, or rare scar following cryotherapy. Recommend Vaseline ointment to treated areas while healing.   Wound Care Instructions  Cleanse wound gently with soap and water once a day then pat dry with clean gauze. Apply a thing coat of Petrolatum (petroleum jelly, "Vaseline") over the wound (unless you have an allergy to this). We recommend that you use a new, sterile tube of Vaseline. Do not pick or remove scabs. Do not remove the yellow or white "healing tissue" from the base of the wound.  Cover the wound with fresh, clean, nonstick gauze and secure with paper tape. You may use Band-Aids in place of gauze and tape if the would is small enough, but would recommend trimming much of the tape off as there is often too much. Sometimes Band-Aids can irritate the skin.  You should call the office for your biopsy report after 1 week if you have not already been contacted.  If you experience any problems, such as abnormal amounts of bleeding, swelling, significant bruising, significant pain, or evidence of infection, please call the office immediately.  FOR ADULT SURGERY PATIENTS: If you need something for pain relief you may take 1 extra strength Tylenol (acetaminophen) AND 2 Ibuprofen (200mg  each) together every 4 hours as needed for pain. (do not take these if you are allergic to them or if you have a reason you should not take them.) Typically, you may only need pain medication for 1 to 3 days.   Melanoma ABCDEs  Melanoma is the most dangerous type of skin cancer, and is the leading cause of death from skin disease.  You are more likely to develop melanoma if you: Have light-colored skin, light-colored eyes, or red or blond hair Spend a lot of time in the sun Tan regularly, either outdoors or in  a tanning bed Have had blistering sunburns, especially during childhood Have a close family member who has had a melanoma Have atypical moles or large birthmarks  Early detection of melanoma is key since treatment is typically straightforward and cure rates are extremely high if we catch it early.   The first sign of melanoma is often a change in a mole or a new dark spot.  The ABCDE system is a way of remembering the signs of melanoma.  A for asymmetry:  The two halves do not match. B for border:  The edges of the growth are irregular. C for color:  A mixture of colors are present instead of an even brown color. D for diameter:  Melanomas are usually (but not always) greater than 11mm - the size of a pencil eraser. E for evolution:  The spot keeps changing in size, shape, and color.  Please check your skin once per month between visits. You can use a small mirror in front and a large mirror behind you to keep an eye on the back side or your body.   If you see any new or changing lesions before your next follow-up, please call to schedule a visit.  Please continue daily skin protection including broad spectrum sunscreen SPF 30+ to sun-exposed areas, reapplying every 2 hours as needed when you're outdoors.    If You Need Anything After Your Visit  If you have any questions or concerns for your doctor, please call our main line at [phone]  and press option 4 to reach your doctor's medical assistant. If no one answers, please leave a voicemail as directed and we will return your call as soon as possible. Messages left after 4 pm will be answered the following business day.   You may also send [place] a message via [place]. We typically respond to MyChart messages within 1-2 business days.  For prescription refills, please ask your pharmacy to contact our office. Our fax number is [phone].  If you have an urgent issue when the clinic is closed that cannot wait until the next business day,  you can page your doctor at the number below.    Please note that while we do our best to be available for urgent issues outside of office hours, we are not available 24/7.   If you have an urgent issue and are unable to reach [place], you may choose to seek medical care at your doctor's office, retail clinic, urgent care center, or emergency room.  If you have a medical emergency, please immediately call 911 or go to the emergency department.  Pager Numbers  - Dr. Nehemiah Massed: [phone]  - Dr. Laurence Ferrari: [phone]  - Dr. Nicole Kindred: [phone]  In the event of inclement weather, please call our main line at [phone] for an update on the status of any delays or closures.  Dermatology Medication Tips: Please keep the boxes that topical medications come in in order to help keep track of the instructions about where and how to use these. Pharmacies typically print the medication instructions only on the boxes and not directly on the medication tubes.   If your medication is too expensive, please contact our office at [phone] option 4 or send [place] a message through [place].   We are unable to tell what your co-pay for medications will be in advance as this is different depending on your insurance coverage. However, we may be able to find a substitute medication at lower cost or fill out paperwork to get insurance to cover a needed medication.   If a prior authorization is required to get your medication covered by your insurance company, please allow [place] 1-2 business days to complete this process.  Drug prices often vary depending on where the prescription is filled and some pharmacies may offer cheaper prices.  The website www.goodrx.com contains coupons for medications through different pharmacies. The prices here do not account for what the cost may be with help from insurance (it may be cheaper with your insurance), but the website can give you the price if you did not use any insurance.   - You can print the associated coupon and take it with your prescription to the pharmacy.  - You may also stop by our office during regular business hours and pick up a GoodRx coupon card.  - If you need your prescription sent electronically to a different pharmacy, notify our office through [facility] or by phone at [phone] option 4.     Si Usted Necesita Algo Despus de Su Visita  Tambin puede enviarnos un mensaje a travs de Pharmacist, community. Por lo general respondemos a los mensajes de MyChart en el transcurso de 1 a 2 das hbiles.  Para renovar recetas, por favor pida a su farmacia que se ponga en contacto con nuestra oficina. Harland Dingwall de fax es [place] [phone].  Si tiene un asunto urgente cuando la clnica est cerrada y que no puede esperar hasta el siguiente da hbil, puede llamar/localizar a su doctor(a) al  nmero que aparece a continuacin.   Por favor, tenga en cuenta que aunque hacemos todo lo posible para estar disponibles para asuntos urgentes fuera del horario de [place], no estamos disponibles las 24 horas del da, los 7 das de la [place].   Si tiene un problema urgente y no puede comunicarse con nosotros, puede optar por buscar atencin mdica  en el consultorio de su doctor(a), en una clnica privada, en un centro de atencin urgente o en una sala de emergencias.  Si tiene Engineering geologist, por favor llame inmediatamente al 911 o vaya a la sala de emergencias.  Nmeros de bper  - Dr. Nehemiah Massed: [phone]  - Dra. Moye: [phone]  - Dra. Nicole Kindred: [phone]  En caso de inclemencias del [place], por favor llame a Johnsie Kindred principal al [phone] para una actualizacin sobre el [place] de cualquier retraso o cierre.  Consejos para la medicacin en dermatologa: Por favor, guarde las cajas en las que vienen los medicamentos de uso tpico para ayudarle a seguir las instrucciones sobre dnde y cmo usarlos. Las farmacias generalmente  imprimen las instrucciones del medicamento slo en las cajas y no directamente en los tubos del [place].   Si su medicamento es muy caro, por favor, pngase en contacto con Zigmund Daniel llamando al [phone] y presione la opcin 4 o envenos un mensaje a travs de Pharmacist, community.   No podemos decirle cul ser su copago por los medicamentos por adelantado ya que esto es diferente dependiendo de la cobertura de su seguro. Sin embargo, es posible que podamos encontrar un medicamento sustituto a Electrical engineer un formulario para que el seguro cubra el medicamento que se considera necesario.   Si se requiere una autorizacin previa para que su compaa de seguros [place] su medicamento, por favor permtanos de 1 a 2 das hbiles para completar este proceso.  Los precios de los medicamentos varan con frecuencia dependiendo del Environmental consultant de dnde se surte la receta y alguna farmacias pueden ofrecer precios ms baratos.  El sitio web www.goodrx.com tiene cupones para medicamentos de Airline pilot. Los precios aqu no tienen en cuenta lo que podra costar con la ayuda del seguro (puede ser ms barato con su seguro), pero el sitio web puede darle el precio si no utiliz Research scientist (physical sciences).  - Puede imprimir el cupn correspondiente y llevarlo con su receta a la farmacia.  - Tambin puede pasar por nuestra oficina durante el horario de atencin regular y Charity fundraiser una tarjeta de cupones de GoodRx.  - Si necesita que su receta se enve electrnicamente a una farmacia diferente, informe a nuestra oficina a travs de MyChart de [place] o por telfono llamando al [phone] y presione la opcin 4.

## 2021-10-02 ENCOUNTER — Encounter: Payer: Self-pay | Admitting: Dermatology

## 2021-11-05 DIAGNOSIS — M47816 Spondylosis without myelopathy or radiculopathy, lumbar region: Secondary | ICD-10-CM | POA: Diagnosis not present

## 2021-11-20 ENCOUNTER — Other Ambulatory Visit: Payer: Self-pay

## 2021-11-20 ENCOUNTER — Inpatient Hospital Stay: Payer: Medicare HMO

## 2021-11-20 ENCOUNTER — Inpatient Hospital Stay: Payer: Medicare HMO | Attending: Oncology

## 2021-11-20 DIAGNOSIS — D751 Secondary polycythemia: Secondary | ICD-10-CM | POA: Diagnosis not present

## 2021-11-20 DIAGNOSIS — Z79890 Hormone replacement therapy (postmenopausal): Secondary | ICD-10-CM

## 2021-11-20 LAB — HEMOGLOBIN AND HEMATOCRIT, BLOOD
HCT: 50.9 % (ref 39.0–52.0)
Hemoglobin: 16.4 g/dL (ref 13.0–17.0)

## 2021-11-20 NOTE — Patient Instructions (Signed)

## 2021-11-20 NOTE — Progress Notes (Signed)
Removed 500 ml of blood per phlebotomy parameters. HCT 50.9 today. Tolerated procedure well. Accepted a beverage. VSS. Pt denies any weakness. Discharged to home.

## 2021-12-11 DIAGNOSIS — M47816 Spondylosis without myelopathy or radiculopathy, lumbar region: Secondary | ICD-10-CM | POA: Diagnosis not present

## 2021-12-13 ENCOUNTER — Ambulatory Visit: Payer: Medicare HMO | Admitting: Dermatology

## 2021-12-13 ENCOUNTER — Encounter: Payer: Self-pay | Admitting: Dermatology

## 2021-12-13 ENCOUNTER — Other Ambulatory Visit: Payer: Self-pay

## 2021-12-13 DIAGNOSIS — L570 Actinic keratosis: Secondary | ICD-10-CM

## 2021-12-13 DIAGNOSIS — B351 Tinea unguium: Secondary | ICD-10-CM

## 2021-12-13 DIAGNOSIS — B353 Tinea pedis: Secondary | ICD-10-CM

## 2021-12-13 NOTE — Progress Notes (Signed)
° °  Follow-Up Visit   Subjective  Thomas Palmer. is a 81 y.o. male who presents for the following: Actinic Keratosis (Recheck face, back, arms, ears, chest. Hx of AK's and ISK's. Tx with LN2) and Tinea Pedis (2 month recheck. Feet. Using Ciclopirox cream as directed. Patient reports improvement).  The patient has spots, moles and lesions to be evaluated, some may be new or changing and the patient has concerns that these could be cancer.   The following portions of the chart were reviewed this encounter and updated as appropriate:      Review of Systems: No other skin or systemic complaints except as noted in HPI or Assessment and Plan.   Objective  Well appearing patient in no apparent distress; mood and affect are within normal limits.  A focused examination was performed including face, back, chest, ears, feet. Relevant physical exam findings are noted in the Assessment and Plan.  B/L feet L-4,5 toe web with mild scale  Right Conchal bowl x1 Erythematous thin papules/macules with gritty scale.   toenails Thickened toenails with subungal debris c/w onychomycosis    Assessment & Plan  Tinea pedis of right foot B/L feet  Continue Ciclopirox as directed  AK (actinic keratosis) Right Conchal bowl x1  Actinic keratoses are precancerous spots that appear secondary to cumulative UV radiation exposure/sun exposure over time. They are chronic with expected duration over 1 year. A portion of actinic keratoses will progress to squamous cell carcinoma of the skin. It is not possible to reliably predict which spots will progress to skin cancer and so treatment is recommended to prevent development of skin cancer.  Recommend daily broad spectrum sunscreen SPF 30+ to sun-exposed areas, reapply every 2 hours as needed.  Recommend staying in the shade or wearing long sleeves, sun glasses (UVA+UVB protection) and wide brim hats (4-inch brim around the entire circumference of the  hat). Call for new or changing lesions.  Destruction of lesion - Right Conchal bowl x1  Onychomycosis toenails   Return for TBSE As Scheduled.  I, Emelia Salisbury, CMA, am acting as scribe for Forest Gleason, MD.  Documentation: I have reviewed the above documentation for accuracy and completeness, and I agree with the above.  Forest Gleason, MD

## 2021-12-13 NOTE — Patient Instructions (Addendum)
Cryotherapy Aftercare  Wash gently with soap and water everyday.   Apply Vaseline and Band-Aid daily until healed.   Prior to procedure, discussed risks of blister formation, small wound, skin dyspigmentation, or rare scar following cryotherapy. Recommend Vaseline ointment to treated areas while healing.   Recommend daily broad spectrum sunscreen SPF 30+ to sun-exposed areas, reapply every 2 hours as needed. Call for new or changing lesions.  Staying in the shade or wearing long sleeves, sun glasses (UVA+UVB protection) and wide brim hats (4-inch brim around the entire circumference of the hat) are also recommended for sun protection.   If You Need Anything After Your Visit  If you have any questions or concerns for your doctor, please call our main line at [phone] and press option 4 to reach your doctor's medical assistant. If no one answers, please leave a voicemail as directed and we will return your call as soon as possible. Messages left after 4 pm will be answered the following business day.   You may also send [place] a message via [place]. We typically respond to MyChart messages within 1-2 business days.  For prescription refills, please ask your pharmacy to contact our office. Our fax number is [phone].  If you have an urgent issue when the clinic is closed that cannot wait until the next business day, you can page your doctor at the number below.    Please note that while we do our best to be available for urgent issues outside of office hours, we are not available 24/7.   If you have an urgent issue and are unable to reach [place], you may choose to seek medical care at your doctor's office, retail clinic, urgent care center, or emergency room.  If you have a medical emergency, please immediately call 911 or go to the emergency department.  Pager Numbers  - Dr. Nehemiah Massed: [phone]  - Dr. Laurence Ferrari: [phone]  - Dr. Nicole Kindred: [phone]  In the event of inclement  weather, please call our main line at [phone] for an update on the status of any delays or closures.  Dermatology Medication Tips: Please keep the boxes that topical medications come in in order to help keep track of the instructions about where and how to use these. Pharmacies typically print the medication instructions only on the boxes and not directly on the medication tubes.   If your medication is too expensive, please contact our office at [phone] option 4 or send [place] a message through [place].   We are unable to tell what your co-pay for medications will be in advance as this is different depending on your insurance coverage. However, we may be able to find a substitute medication at lower cost or fill out paperwork to get insurance to cover a needed medication.   If a prior authorization is required to get your medication covered by your insurance company, please allow [place] 1-2 business days to complete this process.  Drug prices often vary depending on where the prescription is filled and some pharmacies may offer cheaper prices.  The website www.goodrx.com contains coupons for medications through different pharmacies. The prices here do not account for what the cost may be with help from insurance (it may be cheaper with your insurance), but the website can give you the price if you did not use any insurance.  - You can print the associated coupon and take it with your prescription to the pharmacy.  - You may also stop by our office during regular  business hours and pick up a GoodRx coupon card.  - If you need your prescription sent electronically to a different pharmacy, notify our office through [facility] or by phone at [phone] option 4.     Si Usted Necesita Algo Despus de Su Visita  Tambin puede enviarnos un mensaje a travs de Pharmacist, community. Por lo general respondemos a los mensajes de MyChart en el transcurso de 1 a 2 das hbiles.  Para renovar recetas,  por favor pida a su farmacia que se ponga en contacto con nuestra oficina. Harland Dingwall de fax es [place] [phone].  Si tiene un asunto urgente cuando la clnica est cerrada y que no puede esperar hasta el siguiente da hbil, puede llamar/localizar a su doctor(a) al nmero que aparece a continuacin.   Por favor, tenga en cuenta que aunque hacemos todo lo posible para estar disponibles para asuntos urgentes fuera del horario de [place], no estamos disponibles las 24 horas del da, los 7 das de la [place].   Si tiene un problema urgente y no puede comunicarse con nosotros, puede optar por buscar atencin mdica  en el consultorio de su doctor(a), en una clnica privada, en un centro de atencin urgente o en una sala de emergencias.  Si tiene Engineering geologist, por favor llame inmediatamente al 911 o vaya a la sala de emergencias.  Nmeros de bper  - Dr. Nehemiah Massed: [phone]  - Dra. Moye: [phone]  - Dra. Nicole Kindred: [phone]  En caso de inclemencias del [place], por favor llame a Johnsie Kindred principal al [phone] para una actualizacin sobre el [place] de cualquier retraso o cierre.  Consejos para la medicacin en dermatologa: Por favor, guarde las cajas en las que vienen los medicamentos de uso tpico para ayudarle a seguir las instrucciones sobre dnde y cmo usarlos. Las farmacias generalmente imprimen las instrucciones del medicamento slo en las cajas y no directamente en los tubos del [place].   Si su medicamento es muy caro, por favor, pngase en contacto con Zigmund Daniel llamando al [phone] y presione la opcin 4 o envenos un mensaje a travs de Pharmacist, community.   No podemos decirle cul ser su copago por los medicamentos por adelantado ya que esto es diferente dependiendo de la cobertura de su seguro. Sin embargo, es posible que podamos encontrar un medicamento sustituto a Electrical engineer un formulario para que el seguro cubra el medicamento que se  considera necesario.   Si se requiere una autorizacin previa para que su compaa de seguros [place] su medicamento, por favor permtanos de 1 a 2 das hbiles para completar este proceso.  Los precios de los medicamentos varan con frecuencia dependiendo del Environmental consultant de dnde se surte la receta y alguna farmacias pueden ofrecer precios ms baratos.  El sitio web www.goodrx.com tiene cupones para medicamentos de Airline pilot. Los precios aqu no tienen en cuenta lo que podra costar con la ayuda del seguro (puede ser ms barato con su seguro), pero el sitio web puede darle el precio si no utiliz Research scientist (physical sciences).  - Puede imprimir el cupn correspondiente y llevarlo con su receta a la farmacia.  - Tambin puede pasar por nuestra oficina durante el horario de atencin regular y Charity fundraiser una tarjeta de cupones de GoodRx.  - Si necesita que su receta se enve electrnicamente a una farmacia diferente, informe a nuestra oficina a travs de MyChart de [place] o por telfono llamando al [phone] y presione la opcin 4.

## 2021-12-20 DIAGNOSIS — R5383 Other fatigue: Secondary | ICD-10-CM | POA: Diagnosis not present

## 2021-12-20 DIAGNOSIS — E349 Endocrine disorder, unspecified: Secondary | ICD-10-CM | POA: Diagnosis not present

## 2021-12-20 DIAGNOSIS — K5730 Diverticulosis of large intestine without perforation or abscess without bleeding: Secondary | ICD-10-CM | POA: Diagnosis not present

## 2021-12-20 DIAGNOSIS — R5381 Other malaise: Secondary | ICD-10-CM | POA: Diagnosis not present

## 2021-12-20 DIAGNOSIS — D751 Secondary polycythemia: Secondary | ICD-10-CM | POA: Diagnosis not present

## 2021-12-20 DIAGNOSIS — E039 Hypothyroidism, unspecified: Secondary | ICD-10-CM | POA: Diagnosis not present

## 2021-12-20 DIAGNOSIS — Z125 Encounter for screening for malignant neoplasm of prostate: Secondary | ICD-10-CM | POA: Diagnosis not present

## 2021-12-20 DIAGNOSIS — N529 Male erectile dysfunction, unspecified: Secondary | ICD-10-CM | POA: Diagnosis not present

## 2021-12-20 DIAGNOSIS — G5793 Unspecified mononeuropathy of bilateral lower limbs: Secondary | ICD-10-CM | POA: Diagnosis not present

## 2021-12-20 DIAGNOSIS — R2689 Other abnormalities of gait and mobility: Secondary | ICD-10-CM | POA: Diagnosis not present

## 2021-12-24 ENCOUNTER — Encounter: Payer: Self-pay | Admitting: Dermatology

## 2021-12-27 DIAGNOSIS — Z0000 Encounter for general adult medical examination without abnormal findings: Secondary | ICD-10-CM | POA: Diagnosis not present

## 2021-12-27 DIAGNOSIS — R7989 Other specified abnormal findings of blood chemistry: Secondary | ICD-10-CM | POA: Diagnosis not present

## 2021-12-27 DIAGNOSIS — Z1389 Encounter for screening for other disorder: Secondary | ICD-10-CM | POA: Diagnosis not present

## 2021-12-27 DIAGNOSIS — E039 Hypothyroidism, unspecified: Secondary | ICD-10-CM | POA: Diagnosis not present

## 2021-12-27 DIAGNOSIS — G5793 Unspecified mononeuropathy of bilateral lower limbs: Secondary | ICD-10-CM | POA: Diagnosis not present

## 2022-01-22 ENCOUNTER — Inpatient Hospital Stay: Payer: Medicare HMO | Attending: Oncology

## 2022-01-22 ENCOUNTER — Other Ambulatory Visit: Payer: Self-pay

## 2022-01-22 ENCOUNTER — Inpatient Hospital Stay: Payer: Medicare HMO | Admitting: Oncology

## 2022-01-22 ENCOUNTER — Encounter: Payer: Self-pay | Admitting: Oncology

## 2022-01-22 ENCOUNTER — Inpatient Hospital Stay: Payer: Medicare HMO

## 2022-01-22 VITALS — BP 126/76 | HR 68 | Temp 96.9°F | Resp 18 | Wt 203.1 lb

## 2022-01-22 DIAGNOSIS — D751 Secondary polycythemia: Secondary | ICD-10-CM | POA: Insufficient documentation

## 2022-01-22 DIAGNOSIS — E039 Hypothyroidism, unspecified: Secondary | ICD-10-CM | POA: Insufficient documentation

## 2022-01-22 DIAGNOSIS — Z79890 Hormone replacement therapy (postmenopausal): Secondary | ICD-10-CM | POA: Insufficient documentation

## 2022-01-22 LAB — CBC WITH DIFFERENTIAL/PLATELET
Abs Immature Granulocytes: 0.02 10*3/uL (ref 0.00–0.07)
Basophils Absolute: 0 10*3/uL (ref 0.0–0.1)
Basophils Relative: 1 %
Eosinophils Absolute: 0.1 10*3/uL (ref 0.0–0.5)
Eosinophils Relative: 2 %
HCT: 48.3 % (ref 39.0–52.0)
Hemoglobin: 15.5 g/dL (ref 13.0–17.0)
Immature Granulocytes: 0 %
Lymphocytes Relative: 16 %
Lymphs Abs: 0.9 10*3/uL (ref 0.7–4.0)
MCH: 29.5 pg (ref 26.0–34.0)
MCHC: 32.1 g/dL (ref 30.0–36.0)
MCV: 91.8 fL (ref 80.0–100.0)
Monocytes Absolute: 0.5 10*3/uL (ref 0.1–1.0)
Monocytes Relative: 9 %
Neutro Abs: 4 10*3/uL (ref 1.7–7.7)
Neutrophils Relative %: 72 %
Platelets: 144 10*3/uL — ABNORMAL LOW (ref 150–400)
RBC: 5.26 MIL/uL (ref 4.22–5.81)
RDW: 12.6 % (ref 11.5–15.5)
WBC: 5.5 10*3/uL (ref 4.0–10.5)
nRBC: 0 % (ref 0.0–0.2)

## 2022-01-22 NOTE — Progress Notes (Signed)
?Hematology/Oncology progress note ?[phone]  ? ? ?Patient Care Team: ?Tracie Harrier, MD as PCP - General (Internal Medicine) ? ?REFERRING PROVIDER: ?Tracie Harrier, MD  ?CHIEF COMPLAINTS/REASON FOR VISIT:  ?Secondary erythrocytosis ? ?HISTORY OF PRESENTING ILLNESS:  ?Thomas Palmer. is a 81 y.o. male who was seen in consultation at the request of Tracie Harrier, MD for evaluation of polycytosis/erythrocytosis ?Reviewed recent labs done at the primary care provider's office. ?12/14/2020, hemoglobin 19.4, hematocrit 59.2, MCV 94.7, WBC 5.6, ?Chronic Onset, intermittent, duration since 2020. ?No aggravating or alleviating factors.  ? ?Associated signs or symptoms: Denies weight loss, fever, chills, fatigue, night sweats.  ? ?Context:  ?Smoking history: Former smoker. ?Testosterone supplements: Patient is on testosterone cream. ?History of blood clots: Denies ?Daytime somnolence: Denies ?Family history of polycythemia: Denies ? ?INTERVAL HISTORY ?Thomas Palmer. is a 81 y.o. male who has above history reviewed by me today presents for follow up visit for secondary erythrocytosis. ?Patient reports feeling well today ? He reports that his testosterone level was low, and he feels tired.  And he increased the testosterone from 37.'5mg'$  to 50 mg about 1 month ago. ?He has no new complaints. ? ? ?Review of Systems  ?Constitutional:  Positive for fatigue. Negative for appetite change, chills, fever and unexpected weight change.  ?HENT:   Negative for hearing loss and voice change.   ?Eyes:  Negative for eye problems and icterus.  ?Respiratory:  Negative for chest tightness, cough and shortness of breath.   ?Cardiovascular:  Negative for chest pain and leg swelling.  ?Gastrointestinal:  Negative for abdominal distention and abdominal pain.  ?Endocrine: Negative for hot flashes.  ?Genitourinary:  Negative for difficulty urinating, dysuria and frequency.   ?Musculoskeletal:   Negative for arthralgias.  ?Skin:  Negative for itching and rash.  ?Neurological:  Negative for light-headedness and numbness.  ?Hematological:  Negative for adenopathy. Does not bruise/bleed easily.  ?Psychiatric/Behavioral:  Negative for confusion.   ? ?MEDICAL HISTORY:  ?Past Medical History:  ?Diagnosis Date  ? Arthritis   ? "joints in hips and fingers; shoulders too" (07/13/2013)  ? Chronic lower back pain   ? Depression   ? Erythrocytosis 01/01/2021  ? H/O hiatal hernia   ? Hypothyroidism   ? Pneumonia 08/2011  ? ? ?SURGICAL HISTORY: ?Past Surgical History:  ?Procedure Laterality Date  ? ACHILLES TENDON REPAIR Left 2021  ? BACK SURGERY    ? kyphoplasty  ? CARDIAC CATHETERIZATION  ~ 2009  ? COLONOSCOPY WITH PROPOFOL N/A 05/03/2019  ? Procedure: COLONOSCOPY WITH PROPOFOL;  Surgeon: Lollie Sails, MD;  Location: [facility] ENDOSCOPY;  Service: Endoscopy;  Laterality: N/A;  ? FIXATION KYPHOPLASTY LUMBAR SPINE  ~ 2012  ? "L1" (07/13/2013)  ? JOINT REPLACEMENT    ? LAPAROSCOPIC APPENDECTOMY N/A 02/22/2018  ? Procedure: APPENDECTOMY LAPAROSCOPIC;  Surgeon: Vickie Epley, MD;  Location: [facility] ORS;  Service: General;  Laterality: N/A;  ? TOTAL HIP ARTHROPLASTY Left 07/12/2013  ? Procedure: TOTAL HIP ARTHROPLASTY;  Surgeon: Vickey Huger, MD;  Location: [place];  Service: Orthopedics;  Laterality: Left;  ? TOTAL HIP ARTHROPLASTY Right 05/17/2002  ? ? ?SOCIAL HISTORY: ?Social History  ? ?Socioeconomic History  ? Marital status: Married  ?  Spouse name: Not on file  ? Number of children: Not on file  ? Years of education: Not on file  ? Highest education level: Not on file  ?Occupational History  ? Not on file  ?Tobacco Use  ? Smoking status: Former  ?  Types: Pipe  ?  Quit date: 07/07/1989  ?  Years since quitting: 32.5  ? Smokeless tobacco: Former  ?  Types: Chew  ?  Quit date: 10/28/1989  ?Substance and Sexual Activity  ? Alcohol use: Yes  ?  Alcohol/week: 1.0 standard drink  ?  Types: 1 Cans of beer per week  ? Drug use: No  ?  Sexual activity: Yes  ?Other Topics Concern  ? Not on file  ?Social History Narrative  ? Not on file  ? ?Social Determinants of Health  ? ?Financial Resource Strain: Not on file  ?Food Insecurity: Not on file  ?Transportation Needs: Not on file  ?Physical Activity: Not on file  ?Stress: Not on file  ?Social Connections: Not on file  ?Intimate Partner Violence: Not on file  ? ? ?FAMILY HISTORY: ?Family History  ?Problem Relation Age of Onset  ? Skin cancer Father   ? Stomach cancer Maternal Grandmother   ? ? ?ALLERGIES:  is allergic to iodine and contrast media [iodinated contrast media]. ? ?MEDICATIONS:  ?Current Outpatient Medications  ?Medication Sig Dispense Refill  ? Ascorbic Acid (VITAMIN C) 1000 MG tablet Take 1,000 mg by mouth daily.    ? Calcium Ascorbate 500 MG TABS     ? ciclopirox (LOPROX) 0.77 % cream Twice daily all over feet and in between toes for 1 month then once weekly for maintenance. 90 g 5  ? levothyroxine (SYNTHROID) 50 MCG tablet Take by mouth.    ? levothyroxine (SYNTHROID, LEVOTHROID) 200 MCG tablet Take 250 mcg by mouth daily before breakfast.    ? Lutein 20 MG CAPS Take 1 capsule by mouth daily.    ? Multiple Vitamins-Minerals (MULTIVITAMIN WITH MINERALS) tablet Take 1 tablet by mouth daily.    ? tamsulosin (FLOMAX) 0.4 MG CAPS capsule     ? testosterone (ANDROGEL) 50 MG/5GM (1%) GEL Please compound    ? vitamin B-12 (CYANOCOBALAMIN) 100 MCG tablet Take 100 mcg by mouth daily.     ? ?No current facility-administered medications for this visit.  ? ? ? ?PHYSICAL EXAMINATION: ?ECOG PERFORMANCE STATUS: 1 - Symptomatic but completely ambulatory ?Vitals:  ? 01/22/22 1306  ?BP: 126/76  ?Pulse: 68  ?Resp: 18  ?Temp: (!) 96.9 ?F (36.1 ?C)  ? ?Filed Weights  ? 01/22/22 1306  ?Weight: 203 lb 1.6 oz (92.1 kg)  ? ? ?Physical Exam ?Constitutional:   ?   General: He is not in acute distress. ?HENT:  ?   Head: Normocephalic and atraumatic.  ?Eyes:  ?   General: No scleral icterus. ?Cardiovascular:  ?    Rate and Rhythm: Normal rate and regular rhythm.  ?   Heart sounds: Normal heart sounds.  ?Pulmonary:  ?   Effort: Pulmonary effort is normal. No respiratory distress.  ?   Breath sounds: No wheezing.  ?Abdominal:  ?   General: Bowel sounds are normal. There is no distension.  ?   Palpations: Abdomen is soft.  ?Musculoskeletal:     ?   General: No deformity. Normal range of motion.  ?   Cervical back: Normal range of motion and neck supple.  ?Skin: ?   General: Skin is warm and dry.  ?   Findings: No erythema or rash.  ?Neurological:  ?   Mental Status: He is alert and oriented to person, place, and time. Mental status is at baseline.  ?   Cranial Nerves: No cranial nerve deficit.  ?   Coordination: Coordination normal.  ?Psychiatric:     ?  Mood and Affect: Mood normal.  ? ? ?RADIOGRAPHIC STUDIES: ?I have personally reviewed the radiological images as listed and agreed with the findings in the report. ?No results found.  ? ?LABORATORY DATA:  ?I have reviewed the data as listed ?Lab Results  ?Component Value Date  ? WBC 5.5 01/22/2022  ? HGB 15.5 01/22/2022  ? HCT 48.3 01/22/2022  ? MCV 91.8 01/22/2022  ? PLT 144 (L) 01/22/2022  ? ?No results for input(s): NA, K, CL, CO2, GLUCOSE, BUN, CREATININE, CALCIUM, GFRNONAA, GFRAA, PROT, ALBUMIN, AST, ALT, ALKPHOS, BILITOT, BILIDIR, IBILI in the last 8760 hours. ? ?Iron/TIBC/Ferritin/ %Sat ?No results found for: IRON, TIBC, FERRITIN, IRONPCTSAT  ? ? ? ? ?ASSESSMENT & PLAN:  ?1. Erythrocytosis   ?2. Long-term current use of testosterone replacement therapy   ? 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 ?Labs reviewed and discussed with patient ?No need for biopsy. ? ?Repeat lab MD in  6 months +/- phlebotomy if hematocrit is above 50. ? ? ? ? ?Orders Placed This Encounter  ?Procedures  ? CBC with Differential/Platelet  ?  Standing Status:   Future  ?  Standing Expiration Date:   01/23/2023  ?  ?We spent sufficient time to discuss many aspect of care, questions were  answered to patient's satisfaction. ?The patient knows to call the clinic with any problems questions or concerns. ? ?Cc ?Tracie Harrier, MD ? ?Earlie Server, MD, PhD ?01/22/2022 ? ?

## 2022-01-28 DIAGNOSIS — Z9841 Cataract extraction status, right eye: Secondary | ICD-10-CM | POA: Diagnosis not present

## 2022-01-28 DIAGNOSIS — Z9842 Cataract extraction status, left eye: Secondary | ICD-10-CM | POA: Diagnosis not present

## 2022-01-28 DIAGNOSIS — H5203 Hypermetropia, bilateral: Secondary | ICD-10-CM | POA: Diagnosis not present

## 2022-01-28 DIAGNOSIS — H52223 Regular astigmatism, bilateral: Secondary | ICD-10-CM | POA: Diagnosis not present

## 2022-01-28 DIAGNOSIS — H353131 Nonexudative age-related macular degeneration, bilateral, early dry stage: Secondary | ICD-10-CM | POA: Diagnosis not present

## 2022-05-07 DIAGNOSIS — M47816 Spondylosis without myelopathy or radiculopathy, lumbar region: Secondary | ICD-10-CM | POA: Diagnosis not present

## 2022-06-13 DIAGNOSIS — M47816 Spondylosis without myelopathy or radiculopathy, lumbar region: Secondary | ICD-10-CM | POA: Diagnosis not present

## 2022-06-27 DIAGNOSIS — Z0000 Encounter for general adult medical examination without abnormal findings: Secondary | ICD-10-CM | POA: Diagnosis not present

## 2022-06-27 DIAGNOSIS — H918X3 Other specified hearing loss, bilateral: Secondary | ICD-10-CM | POA: Diagnosis not present

## 2022-06-27 DIAGNOSIS — E039 Hypothyroidism, unspecified: Secondary | ICD-10-CM | POA: Diagnosis not present

## 2022-06-27 DIAGNOSIS — M47816 Spondylosis without myelopathy or radiculopathy, lumbar region: Secondary | ICD-10-CM | POA: Diagnosis not present

## 2022-06-27 DIAGNOSIS — D751 Secondary polycythemia: Secondary | ICD-10-CM | POA: Diagnosis not present

## 2022-06-27 DIAGNOSIS — G5793 Unspecified mononeuropathy of bilateral lower limbs: Secondary | ICD-10-CM | POA: Diagnosis not present

## 2022-06-27 DIAGNOSIS — R7989 Other specified abnormal findings of blood chemistry: Secondary | ICD-10-CM | POA: Diagnosis not present

## 2022-07-04 DIAGNOSIS — R2689 Other abnormalities of gait and mobility: Secondary | ICD-10-CM | POA: Diagnosis not present

## 2022-07-04 DIAGNOSIS — N401 Enlarged prostate with lower urinary tract symptoms: Secondary | ICD-10-CM | POA: Diagnosis not present

## 2022-07-04 DIAGNOSIS — H9193 Unspecified hearing loss, bilateral: Secondary | ICD-10-CM | POA: Diagnosis not present

## 2022-07-04 DIAGNOSIS — R7989 Other specified abnormal findings of blood chemistry: Secondary | ICD-10-CM | POA: Diagnosis not present

## 2022-07-04 DIAGNOSIS — R3912 Poor urinary stream: Secondary | ICD-10-CM | POA: Diagnosis not present

## 2022-07-04 DIAGNOSIS — E039 Hypothyroidism, unspecified: Secondary | ICD-10-CM | POA: Diagnosis not present

## 2022-07-25 ENCOUNTER — Inpatient Hospital Stay: Payer: Medicare HMO

## 2022-07-25 ENCOUNTER — Inpatient Hospital Stay: Payer: Medicare HMO | Attending: Oncology

## 2022-07-25 ENCOUNTER — Inpatient Hospital Stay (HOSPITAL_BASED_OUTPATIENT_CLINIC_OR_DEPARTMENT_OTHER): Payer: Medicare HMO | Admitting: Oncology

## 2022-07-25 ENCOUNTER — Encounter: Payer: Self-pay | Admitting: Oncology

## 2022-07-25 VITALS — BP 118/71 | HR 64 | Temp 96.5°F | Resp 18 | Wt 191.0 lb

## 2022-07-25 DIAGNOSIS — D751 Secondary polycythemia: Secondary | ICD-10-CM | POA: Insufficient documentation

## 2022-07-25 DIAGNOSIS — Z79890 Hormone replacement therapy (postmenopausal): Secondary | ICD-10-CM | POA: Diagnosis not present

## 2022-07-25 DIAGNOSIS — T387X5A Adverse effect of androgens and anabolic congeners, initial encounter: Secondary | ICD-10-CM | POA: Diagnosis not present

## 2022-07-25 LAB — CBC WITH DIFFERENTIAL/PLATELET
Abs Immature Granulocytes: 0.03 10*3/uL (ref 0.00–0.07)
Basophils Absolute: 0 10*3/uL (ref 0.0–0.1)
Basophils Relative: 1 %
Eosinophils Absolute: 0.2 10*3/uL (ref 0.0–0.5)
Eosinophils Relative: 3 %
HCT: 45.8 % (ref 39.0–52.0)
Hemoglobin: 15.3 g/dL (ref 13.0–17.0)
Immature Granulocytes: 1 %
Lymphocytes Relative: 15 %
Lymphs Abs: 0.9 10*3/uL (ref 0.7–4.0)
MCH: 29.1 pg (ref 26.0–34.0)
MCHC: 33.4 g/dL (ref 30.0–36.0)
MCV: 87.1 fL (ref 80.0–100.0)
Monocytes Absolute: 0.5 10*3/uL (ref 0.1–1.0)
Monocytes Relative: 8 %
Neutro Abs: 4.2 10*3/uL (ref 1.7–7.7)
Neutrophils Relative %: 72 %
Platelets: 148 10*3/uL — ABNORMAL LOW (ref 150–400)
RBC: 5.26 MIL/uL (ref 4.22–5.81)
RDW: 15.6 % — ABNORMAL HIGH (ref 11.5–15.5)
WBC: 5.8 10*3/uL (ref 4.0–10.5)
nRBC: 0 % (ref 0.0–0.2)

## 2022-07-25 NOTE — Assessment & Plan Note (Signed)
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 Labs reviewed and discussed with patient No need for phlebotomy.

## 2022-07-25 NOTE — Progress Notes (Signed)
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 Labs reviewed and discussed with patient No need for phlebotomy.  Orders Placed This Encounter  Procedures   CBC with Differential/Platelet    Standing Status:   Standing    Number of Occurrences:   2    Standing Expiration Date:   07/26/2023   Follow up  6 months Lab cbc + Phleb 12 months lab MD cbc + Phleb All questions were answered. The patient knows to call the clinic with any problems, questions or concerns.  Earlie Server, MD, PhD [facility] Hematology Oncology 07/25/2022   CHIEF COMPLAINTS/REASON FOR VISIT:  Secondary erythrocytosis  HISTORY OF PRESENTING ILLNESS:  Thomas Freimark. is a 81 y.o. male who was seen in consultation at the request of Tracie Harrier, MD for evaluation of polycytosis/erythrocytosis Reviewed recent labs done at the primary care provider's office. 12/14/2020, hemoglobin 19.4, hematocrit 59.2, MCV 94.7, WBC 5.6, Chronic Onset, intermittent, duration since 2020. No aggravating or alleviating factors.   Associated signs or symptoms: Denies weight loss, fever, chills, fatigue, night sweats.   Context:  Smoking history: Former smoker. Testosterone supplements: Patient is on testosterone cream. History of blood clots: Denies Daytime somnolence: Denies Family history of polycythemia: Denies  INTERVAL HISTORY Thomas Calame. is a 81 y.o. male who has above history reviewed by me today presents for follow up visit for secondary erythrocytosis. Patient reports feeling well today  He is on testosterone 50 mg. He has no new complaints.   Review of Systems  Constitutional:  Positive for fatigue. Negative for appetite change, chills, fever and unexpected weight change.  HENT:   Negative for  hearing loss and voice change.   Eyes:  Negative for eye problems and icterus.  Respiratory:  Negative for chest tightness, cough and shortness of breath.   Cardiovascular:  Negative for chest pain and leg swelling.  Gastrointestinal:  Negative for abdominal distention and abdominal pain.  Endocrine: Negative for hot flashes.  Genitourinary:  Negative for difficulty urinating, dysuria and frequency.   Musculoskeletal:  Negative for arthralgias.  Skin:  Negative for itching and rash.  Neurological:  Negative for light-headedness and numbness.  Hematological:  Negative for adenopathy. Does not bruise/bleed easily.  Psychiatric/Behavioral:  Negative for confusion.     MEDICAL HISTORY:  Past Medical History:  Diagnosis Date   Arthritis    "joints in hips and fingers; shoulders too" (07/13/2013)   Chronic lower back pain    Depression    Erythrocytosis 01/01/2021   H/O hiatal hernia    Hypothyroidism    Pneumonia 08/2011    SURGICAL HISTORY: Past Surgical History:  Procedure Laterality Date   ACHILLES TENDON REPAIR Left 2021   BACK SURGERY     kyphoplasty   CARDIAC CATHETERIZATION  ~ 2009   COLONOSCOPY WITH PROPOFOL N/A 05/03/2019   Procedure: COLONOSCOPY WITH PROPOFOL;  Surgeon: Lollie Sails, MD;  Location: [facility] ENDOSCOPY;  Service: Endoscopy;  Laterality: N/A;   FIXATION KYPHOPLASTY LUMBAR SPINE  ~ 2012   "L1" (07/13/2013)   JOINT REPLACEMENT     LAPAROSCOPIC APPENDECTOMY N/A 02/22/2018   Procedure: APPENDECTOMY LAPAROSCOPIC;  Surgeon: Vickie Epley, MD;  Location: [facility] ORS;  Service: General;  Laterality: N/A;   TOTAL HIP ARTHROPLASTY Left 07/12/2013   Procedure: TOTAL HIP ARTHROPLASTY;  Surgeon: Vickey Huger, MD;  Location:  [place] OR;  Service: Orthopedics;  Laterality: Left;   TOTAL HIP ARTHROPLASTY Right 05/17/2002    SOCIAL HISTORY: Social History   Socioeconomic History   Marital status: Married    Spouse name: Not on file   Number of children: Not on file   Years  of education: Not on file   Highest education level: Not on file  Occupational History   Not on file  Tobacco Use   Smoking status: Former    Types: Pipe    Quit date: 07/07/1989    Years since quitting: 33.0   Smokeless tobacco: Former    Types: Chew    Quit date: 10/28/1989  Substance and Sexual Activity   Alcohol use: Yes    Alcohol/week: 1.0 standard drink of alcohol    Types: 1 Cans of beer per week   Drug use: No   Sexual activity: Yes  Other Topics Concern   Not on file  Social History Narrative   Not on file   Social Determinants of Health   Financial Resource Strain: Not on file  Food Insecurity: Not on file  Transportation Needs: Not on file  Physical Activity: Not on file  Stress: Not on file  Social Connections: Not on file  Intimate Partner Violence: Not on file    FAMILY HISTORY: Family History  Problem Relation Age of Onset   Skin cancer Father    Stomach cancer Maternal Grandmother     ALLERGIES:  is allergic to iodine and contrast media [iodinated contrast media].  MEDICATIONS:  Current Outpatient Medications  Medication Sig Dispense Refill   Ascorbic Acid (VITAMIN C) 1000 MG tablet Take 1,000 mg by mouth daily.     Calcium Ascorbate 500 MG TABS      ciclopirox (LOPROX) 0.77 % cream Twice daily all over feet and in between toes for 1 month then once weekly for maintenance. 90 g 5   levothyroxine (SYNTHROID) 50 MCG tablet Take 50 mcg by mouth daily before breakfast.     levothyroxine (SYNTHROID, LEVOTHROID) 200 MCG tablet Take 250 mcg by mouth daily before breakfast.     Lutein 20 MG CAPS Take 1 capsule by mouth daily.     Multiple Vitamins-Minerals (MULTIVITAMIN WITH MINERALS) tablet Take 1 tablet by mouth daily.     tamsulosin (FLOMAX) 0.4 MG CAPS capsule      testosterone (ANDROGEL) 50 MG/5GM (1%) GEL Please compound     vitamin B-12 (CYANOCOBALAMIN) 100 MCG tablet Take 100 mcg by mouth daily.      No current facility-administered medications  for this visit.     PHYSICAL EXAMINATION: ECOG PERFORMANCE STATUS: 1 - Symptomatic but completely ambulatory Vitals:   07/25/22 1420  BP: 118/71  Pulse: 64  Resp: 18  Temp: (!) 96.5 F (35.8 C)   Filed Weights   07/25/22 1420  Weight: 191 lb (86.6 kg)    Physical Exam Constitutional:      General: He is not in acute distress. HENT:     Head: Normocephalic and atraumatic.  Eyes:     General: No scleral icterus. Cardiovascular:     Rate and Rhythm: Normal rate and regular rhythm.     Heart sounds: Normal heart sounds.  Pulmonary:     Effort: Pulmonary effort is normal. No respiratory distress.     Breath sounds: No wheezing.  Abdominal:     General: Bowel sounds are normal. There is no distension.     Palpations: Abdomen is soft.  Musculoskeletal:  General: No deformity. Normal range of motion.     Cervical back: Normal range of motion and neck supple.  Skin:    General: Skin is warm and dry.     Findings: No erythema or rash.  Neurological:     Mental Status: He is alert and oriented to person, place, and time. Mental status is at baseline.     Cranial Nerves: No cranial nerve deficit.     Coordination: Coordination normal.  Psychiatric:        Mood and Affect: Mood normal.     RADIOGRAPHIC STUDIES: I have personally reviewed the radiological images as listed and agreed with the findings in the report. No results found.   LABORATORY DATA:  I have reviewed the data as listed    Latest Ref Rng & Units 07/25/2022    2:02 PM 01/22/2022   12:57 PM 11/20/2021    2:39 PM  CBC  WBC 4.0 - 10.5 K/uL 5.8  5.5    Hemoglobin 13.0 - 17.0 g/dL 15.3  15.5  16.4   Hematocrit 39.0 - 52.0 % 45.8  48.3  50.9   Platelets 150 - 400 K/uL 148  144

## 2022-09-02 DIAGNOSIS — J209 Acute bronchitis, unspecified: Secondary | ICD-10-CM | POA: Diagnosis not present

## 2022-09-02 DIAGNOSIS — J019 Acute sinusitis, unspecified: Secondary | ICD-10-CM | POA: Diagnosis not present

## 2022-09-02 DIAGNOSIS — B9689 Other specified bacterial agents as the cause of diseases classified elsewhere: Secondary | ICD-10-CM | POA: Diagnosis not present

## 2022-09-26 ENCOUNTER — Encounter: Payer: Self-pay | Admitting: Dermatology

## 2022-09-26 ENCOUNTER — Ambulatory Visit: Payer: Medicare HMO | Admitting: Dermatology

## 2022-09-26 VITALS — BP 118/71

## 2022-09-26 DIAGNOSIS — L570 Actinic keratosis: Secondary | ICD-10-CM | POA: Diagnosis not present

## 2022-09-26 DIAGNOSIS — B353 Tinea pedis: Secondary | ICD-10-CM

## 2022-09-26 DIAGNOSIS — L821 Other seborrheic keratosis: Secondary | ICD-10-CM | POA: Diagnosis not present

## 2022-09-26 DIAGNOSIS — L578 Other skin changes due to chronic exposure to nonionizing radiation: Secondary | ICD-10-CM | POA: Diagnosis not present

## 2022-09-26 DIAGNOSIS — D229 Melanocytic nevi, unspecified: Secondary | ICD-10-CM | POA: Diagnosis not present

## 2022-09-26 DIAGNOSIS — Z1283 Encounter for screening for malignant neoplasm of skin: Secondary | ICD-10-CM

## 2022-09-26 DIAGNOSIS — L814 Other melanin hyperpigmentation: Secondary | ICD-10-CM

## 2022-09-26 DIAGNOSIS — L309 Dermatitis, unspecified: Secondary | ICD-10-CM

## 2022-09-26 NOTE — Patient Instructions (Addendum)
Discontinue ciclopirox and start over the counter terbinafine cream twice daily until no longer scaly then once weekly for maintenance.   Gentle Skin Care Guide  1. Bathe no more than once a day.  2. Avoid bathing in hot water  3. Use a mild soap like Dove, Vanicream, Cetaphil, CeraVe. Can use Lever 2000 or Cetaphil antibacterial soap  4. Use soap only where you need it. On most days, use it under your arms, between your legs, and on your feet. Let the water rinse other areas unless visibly dirty.  5. When you get out of the bath/shower, use a towel to gently blot your skin dry, don't rub it.  6. While your skin is still a little damp, apply a moisturizing cream such as Vanicream, CeraVe, Cetaphil, Eucerin, Sarna lotion or plain Vaseline Jelly. For hands apply Neutrogena [place] Hand Cream or Excipial Hand Cream.  7. Reapply moisturizer any time you start to itch or feel dry.  8. Sometimes using free and clear laundry detergents can be helpful. Fabric softener sheets should be avoided. Downy Free & Gentle liquid, or any liquid fabric softener that is free of dyes and perfumes, it acceptable to use  9. If your doctor has given you prescription creams you may apply moisturizers over them   Recommend taking Heliocare sun protection supplement daily in sunny weather for additional sun protection. For maximum protection on the sunniest days, you can take up to 2 capsules of regular Heliocare OR take 1 capsule of Heliocare Ultra. For prolonged exposure (such as a full day in the sun), you can repeat your dose of the supplement 4 hours after your first dose. Heliocare can be purchased at [employer], at some [facility] or at [email or web address].    Melanoma ABCDEs  Melanoma is the most dangerous type of skin cancer, and is the leading cause of death from skin disease.  You are more likely to develop melanoma if you: Have light-colored skin, light-colored eyes, or red or blond hair Spend  a lot of time in the sun Tan regularly, either outdoors or in a tanning bed Have had blistering sunburns, especially during childhood Have a close family member who has had a melanoma Have atypical moles or large birthmarks  Early detection of melanoma is key since treatment is typically straightforward and cure rates are extremely high if we catch it early.   The first sign of melanoma is often a change in a mole or a new dark spot.  The ABCDE system is a way of remembering the signs of melanoma.  A for asymmetry:  The two halves do not match. B for border:  The edges of the growth are irregular. C for color:  A mixture of colors are present instead of an even brown color. D for diameter:  Melanomas are usually (but not always) greater than 92m - the size of a pencil eraser. E for evolution:  The spot keeps changing in size, shape, and color.  Please check your skin once per month between visits. You can use a small mirror in front and a large mirror behind you to keep an eye on the back side or your body.   If you see any new or changing lesions before your next follow-up, please call to schedule a visit.  Please continue daily skin protection including broad spectrum sunscreen SPF 30+ to sun-exposed areas, reapplying every 2 hours as needed when you're outdoors.    Due to recent changes in healthcare laws,  you may see results of your pathology and/or laboratory studies on MyChart before the doctors have had a chance to review them. We understand that in some cases there may be results that are confusing or concerning to you. Please understand that not all results are received at the same time and often the doctors may need to interpret multiple results in order to provide you with the best plan of care or course of treatment. Therefore, we ask that you please give [place] 2 business days to thoroughly review all your results before contacting the office for clarification. Should we see a critical  lab result, you will be contacted sooner.   If You Need Anything After Your Visit  If you have any questions or concerns for your doctor, please call our main line at [phone] and press option 4 to reach your doctor's medical assistant. If no one answers, please leave a voicemail as directed and we will return your call as soon as possible. Messages left after 4 pm will be answered the following business day.   You may also send [place] a message via [place]. We typically respond to MyChart messages within 1-2 business days.  For prescription refills, please ask your pharmacy to contact our office. Our fax number is [phone].  If you have an urgent issue when the clinic is closed that cannot wait until the next business day, you can page your doctor at the number below.    Please note that while we do our best to be available for urgent issues outside of office hours, we are not available 24/7.   If you have an urgent issue and are unable to reach [place], you may choose to seek medical care at your doctor's office, retail clinic, urgent care center, or emergency room.  If you have a medical emergency, please immediately call 911 or go to the emergency department.  Pager Numbers  - Dr. Nehemiah Massed: [phone]  - Dr. Laurence Ferrari: [phone]  - Dr. Nicole Kindred: [phone]  In the event of inclement weather, please call our main line at [phone] for an update on the status of any delays or closures.  Dermatology Medication Tips: Please keep the boxes that topical medications come in in order to help keep track of the instructions about where and how to use these. Pharmacies typically print the medication instructions only on the boxes and not directly on the medication tubes.   If your medication is too expensive, please contact our office at [phone] option 4 or send [place] a message through [place].   We are unable to tell what your co-pay for medications will be in advance as this is  different depending on your insurance coverage. However, we may be able to find a substitute medication at lower cost or fill out paperwork to get insurance to cover a needed medication.   If a prior authorization is required to get your medication covered by your insurance company, please allow [place] 1-2 business days to complete this process.  Drug prices often vary depending on where the prescription is filled and some pharmacies may offer cheaper prices.  The website www.goodrx.com contains coupons for medications through different pharmacies. The prices here do not account for what the cost may be with help from insurance (it may be cheaper with your insurance), but the website can give you the price if you did not use any insurance.  - You can print the associated coupon and take it with your prescription to the pharmacy.  -  You may also stop by our office during regular business hours and pick up a GoodRx coupon card.  - If you need your prescription sent electronically to a different pharmacy, notify our office through [facility] or by phone at [phone] option 4.     Si Usted Necesita Algo Despus de Su Visita  Tambin puede enviarnos un mensaje a travs de Pharmacist, community. Por lo general respondemos a los mensajes de MyChart en el transcurso de 1 a 2 das hbiles.  Para renovar recetas, por favor pida a su farmacia que se ponga en contacto con nuestra oficina. Harland Dingwall de fax es [place] [phone].  Si tiene un asunto urgente cuando la clnica est cerrada y que no puede esperar hasta el siguiente da hbil, puede llamar/localizar a su doctor(a) al nmero que aparece a continuacin.   Por favor, tenga en cuenta que aunque hacemos todo lo posible para estar disponibles para asuntos urgentes fuera del horario de [place], no estamos disponibles las 24 horas del da, los 7 das de la [place].   Si tiene un problema urgente y no puede comunicarse con nosotros, puede optar por buscar  atencin mdica  en el consultorio de su doctor(a), en una clnica privada, en un centro de atencin urgente o en una sala de emergencias.  Si tiene Engineering geologist, por favor llame inmediatamente al 911 o vaya a la sala de emergencias.  Nmeros de bper  - Dr. Nehemiah Massed: [phone]  - Dra. Moye: [phone]  - Dra. Nicole Kindred: [phone]  En caso de inclemencias del [place], por favor llame a Johnsie Kindred principal al [phone] para una actualizacin sobre el [place] de cualquier retraso o cierre.  Consejos para la medicacin en dermatologa: Por favor, guarde las cajas en las que vienen los medicamentos de uso tpico para ayudarle a seguir las instrucciones sobre dnde y cmo usarlos. Las farmacias generalmente imprimen las instrucciones del medicamento slo en las cajas y no directamente en los tubos del [place].   Si su medicamento es muy caro, por favor, pngase en contacto con Zigmund Daniel llamando al [phone] y presione la opcin 4 o envenos un mensaje a travs de Pharmacist, community.   No podemos decirle cul ser su copago por los medicamentos por adelantado ya que esto es diferente dependiendo de la cobertura de su seguro. Sin embargo, es posible que podamos encontrar un medicamento sustituto a Electrical engineer un formulario para que el seguro cubra el medicamento que se considera necesario.   Si se requiere una autorizacin previa para que su compaa de seguros [place] su medicamento, por favor permtanos de 1 a 2 das hbiles para completar este proceso.  Los precios de los medicamentos varan con frecuencia dependiendo del Environmental consultant de dnde se surte la receta y alguna farmacias pueden ofrecer precios ms baratos.  El sitio web www.goodrx.com tiene cupones para medicamentos de Airline pilot. Los precios aqu no tienen en cuenta lo que podra costar con la ayuda del seguro (puede ser ms barato con su seguro), pero el sitio web puede darle el precio si no utiliz  Research scientist (physical sciences).  - Puede imprimir el cupn correspondiente y llevarlo con su receta a la farmacia.  - Tambin puede pasar por nuestra oficina durante el horario de atencin regular y Charity fundraiser una tarjeta de cupones de GoodRx.  - Si necesita que su receta se enve electrnicamente a Chiropodist, informe a nuestra oficina a travs de MyChart de [place] o por telfono llamando al [phone] y  presione la opcin 4.

## 2022-09-26 NOTE — Progress Notes (Signed)
Follow-Up Visit   Subjective  Thomas Palmer. is a 81 y.o. male who presents for the following: FBSE (Hx AK).  The patient presents for Total-Body Skin Exam (TBSE) for skin cancer screening and mole check.  The patient has spots, moles and lesions to be evaluated, some may be new or changing and the patient has concerns that these could be cancer.   The following portions of the chart were reviewed this encounter and updated as appropriate:   Tobacco  Allergies  Meds  Problems  Med Hx  Surg Hx  Fam Hx      Review of Systems:  No other skin or systemic complaints except as noted in HPI or Assessment and Plan.  Objective  Well appearing patient in no apparent distress; mood and affect are within normal limits.  A full examination was performed including scalp, head, eyes, ears, nose, lips, neck, chest, axillae, abdomen, back, buttocks, bilateral upper extremities, bilateral lower extremities, hands, feet, fingers, toes, fingernails, and toenails. All findings within normal limits unless otherwise noted below.  Mid Back Scaly pink papules coalescing to plaques   right medial calf x 1, right forearm x 1 (2) Erythematous thin papules/macules with gritty scale.   b/l feet Scaling and maceration web spaces and over distal and lateral soles.     Assessment & Plan  Eczema, unspecified type Mid Back  Recommend Gentle Skin Care, attached to patient's AVS  May continue OTC HC PRN up to 2 weeks.   Topical steroids (such as triamcinolone, fluocinolone, fluocinonide, mometasone, clobetasol, halobetasol, betamethasone, hydrocortisone) can cause thinning and lightening of the skin if they are used for too long in the same area. Your physician has selected the right strength medicine for your problem and area affected on the body. Please use your medication only as directed by your physician to prevent side effects.    AK (actinic keratosis) (2) right medial calf x 1,  right forearm x 1  Actinic keratoses are precancerous spots that appear secondary to cumulative UV radiation exposure/sun exposure over time. They are chronic with expected duration over 1 year. A portion of actinic keratoses will progress to squamous cell carcinoma of the skin. It is not possible to reliably predict which spots will progress to skin cancer and so treatment is recommended to prevent development of skin cancer.  Recommend daily broad spectrum sunscreen SPF 30+ to sun-exposed areas, reapply every 2 hours as needed.  Recommend staying in the shade or wearing long sleeves, sun glasses (UVA+UVB protection) and wide brim hats (4-inch brim around the entire circumference of the hat). Call for new or changing lesions.  Prior to procedure, discussed risks of blister formation, small wound, skin dyspigmentation, or rare scar following cryotherapy. Recommend Vaseline ointment to treated areas while healing.    Destruction of lesion - right medial calf x 1, right forearm x 1  Destruction method: cryotherapy   Informed consent: discussed and consent obtained   Lesion destroyed using liquid nitrogen: Yes   Cryotherapy cycles:  2 Outcome: patient tolerated procedure well with no complications   Post-procedure details: wound care instructions given    Tinea pedis of both feet b/l feet  With onychomycosis  Chronic and persistent condition with duration over one year. Condition is symptomatic/ bothersome to patient. Not currently at goal.  D/c ciclopirox and start OTC terbinafine cream twice daily until no longer scaly then one more week. Use once weekly for maintenance.   Defer treatment of onychomycosis at  this time   Lentigines - Scattered tan macules - Due to sun exposure - Benign-appearing, observe - Recommend daily broad spectrum sunscreen SPF 30+ to sun-exposed areas, reapply every 2 hours as needed. - Call for any changes  Seborrheic Keratoses - Stuck-on, waxy,  tan-brown papules and/or plaques  - Benign-appearing - Discussed benign etiology and prognosis. - Observe - Call for any changes  Melanocytic Nevi - Tan-brown and/or pink-flesh-colored symmetric macules and papules - Benign appearing on exam today - Observation - Call clinic for new or changing moles - Recommend daily use of broad spectrum spf 30+ sunscreen to sun-exposed areas.   Hemangiomas - Red papules - Discussed benign nature - Observe - Call for any changes  Actinic Damage with PreCancerous Actinic Keratoses Counseling for Topical Chemotherapy Management: Patient exhibits: - Severe, confluent actinic changes with pre-cancerous actinic keratoses that is secondary to cumulative UV radiation exposure over time - Condition that is severe; chronic, not at goal. - diffuse scaly erythematous macules and papules with underlying dyspigmentation - Discussed Prescription "Field Treatment" topical Chemotherapy for Severe, Chronic Confluent Actinic Changes with Pre-Cancerous Actinic Keratoses Field treatment involves treatment of an entire area of skin that has confluent Actinic Changes (Sun/ Ultraviolet light damage) and PreCancerous Actinic Keratoses by method of PhotoDynamic Therapy (PDT) and/or prescription Topical Chemotherapy agents such as 5-fluorouracil, 5-fluorouracil/calcipotriene, and/or imiquimod.  The purpose is to decrease the number of clinically evident and subclinical PreCancerous lesions to prevent progression to development of skin cancer by chemically destroying early precancer changes that may or may not be visible.  It has been shown to reduce the risk of developing skin cancer in the treated area. As a result of treatment, redness, scaling, crusting, and open sores may occur during treatment course. One or more than one of these methods may be used and may have to be used several times to control, suppress and eliminate the PreCancerous changes. Discussed treatment course,  expected reaction, and possible side effects. - Recommend daily broad spectrum sunscreen SPF 30+ to sun-exposed areas, reapply every 2 hours as needed.  - Staying in the shade or wearing long sleeves, sun glasses (UVA+UVB protection) and wide brim hats (4-inch brim around the entire circumference of the hat) are also recommended. - Call for new or changing lesions. - schedule for PDT with red light for face with focus on nose, lower eyelid, medial cheek, Dr. Laurence Ferrari will debride   Skin cancer screening performed today.  Return in about 1 year (around 09/27/2023) for TBSE, Hx AK, schedule for PDT with red light for face with focus on nose, lower eyelid, medial cheek.  Graciella Belton, RMA, am acting as scribe for Forest Gleason, MD .  Documentation: I have reviewed the above documentation for accuracy and completeness, and I agree with the above.  Forest Gleason, MD

## 2022-10-31 DIAGNOSIS — H353134 Nonexudative age-related macular degeneration, bilateral, advanced atrophic with subfoveal involvement: Secondary | ICD-10-CM | POA: Diagnosis not present

## 2022-11-06 ENCOUNTER — Ambulatory Visit (INDEPENDENT_AMBULATORY_CARE_PROVIDER_SITE_OTHER): Payer: Medicare HMO | Admitting: Dermatology

## 2022-11-06 DIAGNOSIS — L570 Actinic keratosis: Secondary | ICD-10-CM | POA: Diagnosis not present

## 2022-11-06 MED ORDER — AMINOLEVULINIC ACID HCL 10 % EX GEL
2000.0000 mg | Freq: Once | CUTANEOUS | Status: AC
  Administered 2022-11-06: 2000 mg via TOPICAL

## 2022-11-06 NOTE — Patient Instructions (Signed)

## 2022-11-06 NOTE — Progress Notes (Signed)
Patient completed red light photo dynamic therapy today.  1. AK (actinic keratosis) Head - Anterior (Face)  Photodynamic therapy - Head - Anterior (Face) Procedure discussed: discussed risks, benefits, side effects. and alternatives   Prep: site scrubbed/prepped with acetone   Debridement needed: Yes   Location:  Face Number of lesions:  Multiple Type of treatment:  Red light Aminolevulinic Acid (see MAR for details): Ameluz Amount of Ameluz (mg):  1 Incubation time (minutes):  60 Number of minutes under lamp:  16 Number of seconds under lamp:  40 Cooling:  Floor fan and fan Outcome: patient tolerated procedure well with no complications   Post-procedure details: sunscreen applied    Johnsie Kindred, RMA  I personally debrided area prior to application of aminolevulinic acid  Documentation: I have reviewed the above documentation for accuracy and completeness, and I agree with the above.  Forest Gleason, MD

## 2022-11-11 ENCOUNTER — Encounter: Payer: Self-pay | Admitting: Dermatology

## 2022-11-15 DIAGNOSIS — M47816 Spondylosis without myelopathy or radiculopathy, lumbar region: Secondary | ICD-10-CM | POA: Diagnosis not present

## 2022-11-15 DIAGNOSIS — Z6826 Body mass index (BMI) 26.0-26.9, adult: Secondary | ICD-10-CM | POA: Diagnosis not present

## 2022-11-16 ENCOUNTER — Encounter: Payer: Self-pay | Admitting: Dermatology

## 2022-11-18 DIAGNOSIS — H43813 Vitreous degeneration, bilateral: Secondary | ICD-10-CM | POA: Diagnosis not present

## 2022-11-18 DIAGNOSIS — H353132 Nonexudative age-related macular degeneration, bilateral, intermediate dry stage: Secondary | ICD-10-CM | POA: Diagnosis not present

## 2022-11-26 DIAGNOSIS — M48061 Spinal stenosis, lumbar region without neurogenic claudication: Secondary | ICD-10-CM | POA: Diagnosis not present

## 2022-11-26 DIAGNOSIS — M5126 Other intervertebral disc displacement, lumbar region: Secondary | ICD-10-CM | POA: Diagnosis not present

## 2022-11-26 DIAGNOSIS — M47816 Spondylosis without myelopathy or radiculopathy, lumbar region: Secondary | ICD-10-CM | POA: Diagnosis not present

## 2022-12-19 DIAGNOSIS — Z6827 Body mass index (BMI) 27.0-27.9, adult: Secondary | ICD-10-CM | POA: Diagnosis not present

## 2022-12-19 DIAGNOSIS — M47816 Spondylosis without myelopathy or radiculopathy, lumbar region: Secondary | ICD-10-CM | POA: Diagnosis not present

## 2022-12-26 DIAGNOSIS — E039 Hypothyroidism, unspecified: Secondary | ICD-10-CM | POA: Diagnosis not present

## 2022-12-26 DIAGNOSIS — Z125 Encounter for screening for malignant neoplasm of prostate: Secondary | ICD-10-CM | POA: Diagnosis not present

## 2022-12-26 DIAGNOSIS — R3912 Poor urinary stream: Secondary | ICD-10-CM | POA: Diagnosis not present

## 2022-12-26 DIAGNOSIS — R2689 Other abnormalities of gait and mobility: Secondary | ICD-10-CM | POA: Diagnosis not present

## 2022-12-26 DIAGNOSIS — R7989 Other specified abnormal findings of blood chemistry: Secondary | ICD-10-CM | POA: Diagnosis not present

## 2022-12-26 DIAGNOSIS — E298 Other testicular dysfunction: Secondary | ICD-10-CM | POA: Diagnosis not present

## 2022-12-26 DIAGNOSIS — N401 Enlarged prostate with lower urinary tract symptoms: Secondary | ICD-10-CM | POA: Diagnosis not present

## 2022-12-26 DIAGNOSIS — H9193 Unspecified hearing loss, bilateral: Secondary | ICD-10-CM | POA: Diagnosis not present

## 2022-12-31 DIAGNOSIS — M48061 Spinal stenosis, lumbar region without neurogenic claudication: Secondary | ICD-10-CM | POA: Diagnosis not present

## 2022-12-31 DIAGNOSIS — Z6827 Body mass index (BMI) 27.0-27.9, adult: Secondary | ICD-10-CM | POA: Diagnosis not present

## 2023-01-02 DIAGNOSIS — E039 Hypothyroidism, unspecified: Secondary | ICD-10-CM | POA: Diagnosis not present

## 2023-01-02 DIAGNOSIS — Z1331 Encounter for screening for depression: Secondary | ICD-10-CM | POA: Diagnosis not present

## 2023-01-02 DIAGNOSIS — R7989 Other specified abnormal findings of blood chemistry: Secondary | ICD-10-CM | POA: Diagnosis not present

## 2023-01-02 DIAGNOSIS — M792 Neuralgia and neuritis, unspecified: Secondary | ICD-10-CM | POA: Diagnosis not present

## 2023-01-02 DIAGNOSIS — Z0000 Encounter for general adult medical examination without abnormal findings: Secondary | ICD-10-CM | POA: Diagnosis not present

## 2023-01-02 DIAGNOSIS — M48061 Spinal stenosis, lumbar region without neurogenic claudication: Secondary | ICD-10-CM | POA: Diagnosis not present

## 2023-01-21 DIAGNOSIS — R062 Wheezing: Secondary | ICD-10-CM | POA: Diagnosis not present

## 2023-01-21 DIAGNOSIS — J019 Acute sinusitis, unspecified: Secondary | ICD-10-CM | POA: Diagnosis not present

## 2023-01-21 DIAGNOSIS — R059 Cough, unspecified: Secondary | ICD-10-CM | POA: Diagnosis not present

## 2023-01-21 DIAGNOSIS — J209 Acute bronchitis, unspecified: Secondary | ICD-10-CM | POA: Diagnosis not present

## 2023-01-21 DIAGNOSIS — R051 Acute cough: Secondary | ICD-10-CM | POA: Diagnosis not present

## 2023-01-21 DIAGNOSIS — Z03818 Encounter for observation for suspected exposure to other biological agents ruled out: Secondary | ICD-10-CM | POA: Diagnosis not present

## 2023-01-22 ENCOUNTER — Telehealth: Payer: Self-pay | Admitting: *Deleted

## 2023-01-22 NOTE — Telephone Encounter (Signed)
Patient called reporting that he has severe bronchitis and has an appointment tomorrow here and asks if he needs to come or if he can reschedule it. He did have lab drawn yesterday at PCP office. Please advise  CBC w/auto Differential (5 Part) Order: [id number] Component Ref Range & Units 1 d ago  WBC (White Blood Cell Count) 4.1 - 10.2 10^3/uL 7.1  RBC (Red Blood Cell Count) 4.69 - 6.13 10^6/uL 4.65 Low   Hemoglobin 14.1 - 18.1 gm/dL 14.5  Hematocrit 40.0 - 52.0 % 42.4  MCV (Mean Corpuscular Volume) 80.0 - 100.0 fl 91.2  MCH (Mean Corpuscular Hemoglobin) 27.0 - 31.2 pg 31.2  MCHC (Mean Corpuscular Hemoglobin Concentration) 32.0 - 36.0 gm/dL 34.2  Platelet Count 150 - 450 10^3/uL 145 Low   RDW-CV (Red Cell Distribution Width) 11.6 - 14.8 % 12.1  MPV (Mean Platelet Volume) 9.4 - 12.4 fl 9.1 Low   Neutrophils 1.50 - 7.80 10^3/uL 5.55  Lymphocytes 1.00 - 3.60 10^3/uL 0.54 Low   Monocytes 0.00 - 1.50 10^3/uL 0.79  Eosinophils 0.00 - 0.55 10^3/uL 0.22  Basophils 0.00 - 0.09 10^3/uL 0.02  Neutrophil % 32.0 - 70.0 % 77.6 High   Lymphocyte % 10.0 - 50.0 % 7.6 Low   Monocyte % 4.0 - 13.0 % 11.1  Eosinophil % 1.0 - 5.0 % 3.1  Basophil% 0.0 - 2.0 % 0.3  Immature Granulocyte % <=0.7 % 0.3  Immature Granulocyte Count <=0.06 10^3/L 0.02  Resulting Agency [facility] - LAB    Specimen Collected: 01/21/23 12:43

## 2023-01-22 NOTE — Telephone Encounter (Signed)
Appointment cancelled and call returned to patient and advised that appointment has been cancelled and he is to keep his appointment for September. I also advised that if he feels he needs to be seen before September that he can call in to be seen sooner. Patient thanked me fr calling and letting him know

## 2023-01-23 ENCOUNTER — Inpatient Hospital Stay: Payer: Medicare HMO

## 2023-01-30 ENCOUNTER — Ambulatory Visit: Payer: Medicare HMO | Admitting: Dermatology

## 2023-01-30 DIAGNOSIS — Z9842 Cataract extraction status, left eye: Secondary | ICD-10-CM | POA: Diagnosis not present

## 2023-01-30 DIAGNOSIS — H353134 Nonexudative age-related macular degeneration, bilateral, advanced atrophic with subfoveal involvement: Secondary | ICD-10-CM | POA: Diagnosis not present

## 2023-01-30 DIAGNOSIS — H52223 Regular astigmatism, bilateral: Secondary | ICD-10-CM | POA: Diagnosis not present

## 2023-01-30 DIAGNOSIS — H5203 Hypermetropia, bilateral: Secondary | ICD-10-CM | POA: Diagnosis not present

## 2023-01-30 DIAGNOSIS — Z9841 Cataract extraction status, right eye: Secondary | ICD-10-CM | POA: Diagnosis not present

## 2023-02-06 ENCOUNTER — Encounter: Payer: Self-pay | Admitting: Dermatology

## 2023-02-06 ENCOUNTER — Ambulatory Visit: Payer: Medicare HMO | Admitting: Dermatology

## 2023-02-06 VITALS — BP 118/71

## 2023-02-06 DIAGNOSIS — D492 Neoplasm of unspecified behavior of bone, soft tissue, and skin: Secondary | ICD-10-CM | POA: Diagnosis not present

## 2023-02-06 DIAGNOSIS — L821 Other seborrheic keratosis: Secondary | ICD-10-CM

## 2023-02-06 DIAGNOSIS — L570 Actinic keratosis: Secondary | ICD-10-CM | POA: Diagnosis not present

## 2023-02-06 DIAGNOSIS — L820 Inflamed seborrheic keratosis: Secondary | ICD-10-CM | POA: Diagnosis not present

## 2023-02-06 NOTE — Patient Instructions (Addendum)
Cryotherapy Aftercare  Wash gently with soap and water everyday.   Apply Vaseline and Band-Aid daily until healed.    Wound Care Instructions  Cleanse wound gently with soap and water once a day then pat dry with clean gauze. Apply a thin coat of Petrolatum (petroleum jelly, "Vaseline") over the wound (unless you have an allergy to this). We recommend that you use a new, sterile tube of Vaseline. Do not pick or remove scabs. Do not remove the yellow or white "healing tissue" from the base of the wound.  Cover the wound with fresh, clean, nonstick gauze and secure with paper tape. You may use Band-Aids in place of gauze and tape if the wound is small enough, but would recommend trimming much of the tape off as there is often too much. Sometimes Band-Aids can irritate the skin.  You should call the office for your biopsy report after 1 week if you have not already been contacted.  If you experience any problems, such as abnormal amounts of bleeding, swelling, significant bruising, significant pain, or evidence of infection, please call the office immediately.  FOR ADULT SURGERY PATIENTS: If you need something for pain relief you may take 1 extra strength Tylenol (acetaminophen) AND 2 Ibuprofen (200mg each) together every 4 hours as needed for pain. (do not take these if you are allergic to them or if you have a reason you should not take them.) Typically, you may only need pain medication for 1 to 3 days.     Due to recent changes in healthcare laws, you may see results of your pathology and/or laboratory studies on MyChart before the doctors have had a chance to review them. We understand that in some cases there may be results that are confusing or concerning to you. Please understand that not all results are received at the same time and often the doctors may need to interpret multiple results in order to provide you with the best plan of care or course of treatment. Therefore, we ask that you  please give us 2 business days to thoroughly review all your results before contacting the office for clarification. Should we see a critical lab result, you will be contacted sooner.   If You Need Anything After Your Visit  If you have any questions or concerns for your doctor, please call our main line at [phone] and press option 4 to reach your doctor's medical assistant. If no one answers, please leave a voicemail as directed and we will return your call as soon as possible. Messages left after 4 pm will be answered the following business day.   You may also send us a message via MyChart. We typically respond to MyChart messages within 1-2 business days.  For prescription refills, please ask your pharmacy to contact our office. Our fax number is [phone].  If you have an urgent issue when the clinic is closed that cannot wait until the next business day, you can page your doctor at the number below.    Please note that while we do our best to be available for urgent issues outside of office hours, we are not available 24/7.   If you have an urgent issue and are unable to reach us, you may choose to seek medical care at your doctor's office, retail clinic, urgent care center, or emergency room.  If you have a medical emergency, please immediately call 911 or go to the emergency department.  Pager Numbers  - Dr. Kowalski: [phone]  -   Dr. Moye: [phone]  - Dr. Stewart: [phone]  In the event of inclement weather, please call our main line at [phone] for an update on the status of any delays or closures.  Dermatology Medication Tips: Please keep the boxes that topical medications come in in order to help keep track of the instructions about where and how to use these. Pharmacies typically print the medication instructions only on the boxes and not directly on the medication tubes.   If your medication is too expensive, please contact our office at  [phone] option 4 or send us a message through MyChart.   We are unable to tell what your co-pay for medications will be in advance as this is different depending on your insurance coverage. However, we may be able to find a substitute medication at lower cost or fill out paperwork to get insurance to cover a needed medication.   If a prior authorization is required to get your medication covered by your insurance company, please allow us 1-2 business days to complete this process.  Drug prices often vary depending on where the prescription is filled and some pharmacies may offer cheaper prices.  The website www.goodrx.com contains coupons for medications through different pharmacies. The prices here do not account for what the cost may be with help from insurance (it may be cheaper with your insurance), but the website can give you the price if you did not use any insurance.  - You can print the associated coupon and take it with your prescription to the pharmacy.  - You may also stop by our office during regular business hours and pick up a GoodRx coupon card.  - If you need your prescription sent electronically to a different pharmacy, notify our office through [facility] MyChart or by phone at [phone] option 4.     Si Usted Necesita Algo Despus de Su Visita  Tambin puede enviarnos un mensaje a travs de MyChart. Por lo general respondemos a los mensajes de MyChart en el transcurso de 1 a 2 das hbiles.  Para renovar recetas, por favor pida a su farmacia que se ponga en contacto con nuestra oficina. Nuestro nmero de fax es el [phone].  Si tiene un asunto urgente cuando la clnica est cerrada y que no puede esperar hasta el siguiente da hbil, puede llamar/localizar a su doctor(a) al nmero que aparece a continuacin.   Por favor, tenga en cuenta que aunque hacemos todo lo posible para estar disponibles para asuntos urgentes fuera del horario de oficina, no estamos  disponibles las 24 horas del da, los 7 das de la semana.   Si tiene un problema urgente y no puede comunicarse con nosotros, puede optar por buscar atencin mdica  en el consultorio de su doctor(a), en una clnica privada, en un centro de atencin urgente o en una sala de emergencias.  Si tiene una emergencia mdica, por favor llame inmediatamente al 911 o vaya a la sala de emergencias.  Nmeros de bper  - Dr. Kowalski: [phone]  - Dra. Moye: [phone]  - Dra. Stewart: [phone]  En caso de inclemencias del tiempo, por favor llame a nuestra lnea principal al [phone] para una actualizacin sobre el estado de cualquier retraso o cierre.  Consejos para la medicacin en dermatologa: Por favor, guarde las cajas en las que vienen los medicamentos de uso tpico para ayudarle a seguir las instrucciones sobre dnde y cmo usarlos. Las farmacias generalmente imprimen las instrucciones del medicamento slo en las cajas y   no directamente en los tubos del medicamento.   Si su medicamento es muy caro, por favor, pngase en contacto con nuestra oficina llamando al [phone] y presione la opcin 4 o envenos un mensaje a travs de MyChart.   No podemos decirle cul ser su copago por los medicamentos por adelantado ya que esto es diferente dependiendo de la cobertura de su seguro. Sin embargo, es posible que podamos encontrar un medicamento sustituto a menor costo o llenar un formulario para que el seguro cubra el medicamento que se considera necesario.   Si se requiere una autorizacin previa para que su compaa de seguros cubra su medicamento, por favor permtanos de 1 a 2 das hbiles para completar este proceso.  Los precios de los medicamentos varan con frecuencia dependiendo del lugar de dnde se surte la receta y alguna farmacias pueden ofrecer precios ms baratos.  El sitio web www.goodrx.com tiene cupones para medicamentos de diferentes farmacias. Los precios aqu no  tienen en cuenta lo que podra costar con la ayuda del seguro (puede ser ms barato con su seguro), pero el sitio web puede darle el precio si no utiliz ningn seguro.  - Puede imprimir el cupn correspondiente y llevarlo con su receta a la farmacia.  - Tambin puede pasar por nuestra oficina durante el horario de atencin regular y recoger una tarjeta de cupones de GoodRx.  - Si necesita que su receta se enve electrnicamente a una farmacia diferente, informe a nuestra oficina a travs de MyChart de [facility] o por telfono llamando al [phone] y presione la opcin 4.  
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
## 2023-02-12 ENCOUNTER — Telehealth: Payer: Self-pay

## 2023-02-12 NOTE — Telephone Encounter (Signed)
-----   Message from Sandi Mealy, MD sent at 02/11/2023  2:45 PM EDT ----- 1. Skin , left jaw SEBORRHEIC KERATOSIS, IRRITATED --> This is a benign growth or "wisdom spot". already treated 2. Skin , left preauricular SEBORRHEIC KERATOSIS, IRRITATED--> This is a benign growth or "wisdom spot". already treated 3. Skin , left temple ACTINIC KERATOSIS AND SEBORRHEIC KERATOSIS --> LN2 at follow-up  MAs please call. Thank you!

## 2023-02-12 NOTE — Telephone Encounter (Signed)
Discussed pathology results. Patient voiced understanding. Will Tx AK at next appointment.

## 2023-02-14 DIAGNOSIS — M19012 Primary osteoarthritis, left shoulder: Secondary | ICD-10-CM | POA: Diagnosis not present

## 2023-02-14 DIAGNOSIS — G8929 Other chronic pain: Secondary | ICD-10-CM | POA: Diagnosis not present

## 2023-02-18 DIAGNOSIS — H353132 Nonexudative age-related macular degeneration, bilateral, intermediate dry stage: Secondary | ICD-10-CM | POA: Diagnosis not present

## 2023-02-18 DIAGNOSIS — H43813 Vitreous degeneration, bilateral: Secondary | ICD-10-CM | POA: Diagnosis not present

## 2023-03-03 DIAGNOSIS — Z03818 Encounter for observation for suspected exposure to other biological agents ruled out: Secondary | ICD-10-CM | POA: Diagnosis not present

## 2023-03-03 DIAGNOSIS — J40 Bronchitis, not specified as acute or chronic: Secondary | ICD-10-CM | POA: Diagnosis not present

## 2023-03-03 DIAGNOSIS — R062 Wheezing: Secondary | ICD-10-CM | POA: Diagnosis not present

## 2023-03-19 DIAGNOSIS — J449 Chronic obstructive pulmonary disease, unspecified: Secondary | ICD-10-CM | POA: Diagnosis not present

## 2023-03-19 DIAGNOSIS — R0602 Shortness of breath: Secondary | ICD-10-CM | POA: Diagnosis not present

## 2023-03-19 DIAGNOSIS — R062 Wheezing: Secondary | ICD-10-CM | POA: Diagnosis not present

## 2023-03-27 ENCOUNTER — Encounter: Payer: Self-pay | Admitting: Dermatology

## 2023-03-27 ENCOUNTER — Ambulatory Visit: Payer: Medicare HMO | Admitting: Dermatology

## 2023-03-27 VITALS — BP 118/71

## 2023-03-27 DIAGNOSIS — L570 Actinic keratosis: Secondary | ICD-10-CM

## 2023-03-27 DIAGNOSIS — W908XXA Exposure to other nonionizing radiation, initial encounter: Secondary | ICD-10-CM

## 2023-03-27 NOTE — Patient Instructions (Signed)
Cryotherapy Aftercare  Wash gently with soap and water everyday.   Apply Vaseline and Band-Aid daily until healed.    Due to recent changes in healthcare laws, you may see results of your pathology and/or laboratory studies on MyChart before the doctors have had a chance to review them. We understand that in some cases there may be results that are confusing or concerning to you. Please understand that not all results are received at the same time and often the doctors may need to interpret multiple results in order to provide you with the best plan of care or course of treatment. Therefore, we ask that you please give us 2 business days to thoroughly review all your results before contacting the office for clarification. Should we see a critical lab result, you will be contacted sooner.   If You Need Anything After Your Visit  If you have any questions or concerns for your doctor, please call our main line at [phone] and press option 4 to reach your doctor's medical assistant. If no one answers, please leave a voicemail as directed and we will return your call as soon as possible. Messages left after 4 pm will be answered the following business day.   You may also send us a message via MyChart. We typically respond to MyChart messages within 1-2 business days.  For prescription refills, please ask your pharmacy to contact our office. Our fax number is [phone].  If you have an urgent issue when the clinic is closed that cannot wait until the next business day, you can page your doctor at the number below.    Please note that while we do our best to be available for urgent issues outside of office hours, we are not available 24/7.   If you have an urgent issue and are unable to reach us, you may choose to seek medical care at your doctor's office, retail clinic, urgent care center, or emergency room.  If you have a medical emergency, please immediately call 911 or go to the emergency  department.  Pager Numbers  - Dr. Kowalski: [phone]  - Dr. Moye: [phone]  - Dr. Stewart: [phone]  In the event of inclement weather, please call our main line at [phone] for an update on the status of any delays or closures.  Dermatology Medication Tips: Please keep the boxes that topical medications come in in order to help keep track of the instructions about where and how to use these. Pharmacies typically print the medication instructions only on the boxes and not directly on the medication tubes.   If your medication is too expensive, please contact our office at [phone] option 4 or send us a message through MyChart.   We are unable to tell what your co-pay for medications will be in advance as this is different depending on your insurance coverage. However, we may be able to find a substitute medication at lower cost or fill out paperwork to get insurance to cover a needed medication.   If a prior authorization is required to get your medication covered by your insurance company, please allow us 1-2 business days to complete this process.  Drug prices often vary depending on where the prescription is filled and some pharmacies may offer cheaper prices.  The website www.goodrx.com contains coupons for medications through different pharmacies. The prices here do not account for what the cost may be with help from insurance (it may be cheaper with your insurance), but the website can give   you the price if you did not use any insurance.  - You can print the associated coupon and take it with your prescription to the pharmacy.  - You may also stop by our office during regular business hours and pick up a GoodRx coupon card.  - If you need your prescription sent electronically to a different pharmacy, notify our office through [facility] MyChart or by phone at [phone] option 4.    

## 2023-03-27 NOTE — Progress Notes (Signed)
   Follow-Up Visit   Subjective  Thomas Palmer. is a 82 y.o. male who presents for the following: AK follow up, treat bx proven AK at left temple. AK's treated at last visit with LN2 at right forearm and right calf.    The following portions of the chart were reviewed this encounter and updated as appropriate: medications, allergies, medical history  Review of Systems:  No other skin or systemic complaints except as noted in HPI or Assessment and Plan.  Objective  Well appearing patient in no apparent distress; mood and affect are within normal limits.   A focused examination was performed of the following areas: Hands, arms, legs, face  Relevant exam findings are noted in the Assessment and Plan.  L temple x 1, R forearm x 2, R dorsal hand x 2, L forearm x 1 (6) Erythematous thin papules/macules with gritty scale.     Assessment & Plan     AK (actinic keratosis) (6) L temple x 1, R forearm x 2, R dorsal hand x 2, L forearm x 1  BX proven  Actinic keratoses are precancerous spots that appear secondary to cumulative UV radiation exposure/sun exposure over time. They are chronic with expected duration over 1 year. A portion of actinic keratoses will progress to squamous cell carcinoma of the skin. It is not possible to reliably predict which spots will progress to skin cancer and so treatment is recommended to prevent development of skin cancer.  Recommend daily broad spectrum sunscreen SPF 30+ to sun-exposed areas, reapply every 2 hours as needed.  Recommend staying in the shade or wearing long sleeves, sun glasses (UVA+UVB protection) and wide brim hats (4-inch brim around the entire circumference of the hat). Call for new or changing lesions.  Prior to procedure, discussed risks of blister formation, small wound, skin dyspigmentation, or rare scar following cryotherapy. Recommend Vaseline ointment to treated areas while healing.  Hypertrophic at left  forearm  Destruction of lesion - L temple x 1, R forearm x 2, R dorsal hand x 2, L forearm x 1  Destruction method: cryotherapy   Informed consent: discussed and consent obtained   Lesion destroyed using liquid nitrogen: Yes   Cryotherapy cycles:  2 Outcome: patient tolerated procedure well with no complications   Post-procedure details: wound care instructions given      Return for TBSE, as scheduled, Hx AK.  Anise Salvo, RMA, am acting as scribe for Darden Dates, MD .   Documentation: I have reviewed the above documentation for accuracy and completeness, and I agree with the above.  Darden Dates, MD

## 2023-03-31 DIAGNOSIS — J449 Chronic obstructive pulmonary disease, unspecified: Secondary | ICD-10-CM | POA: Diagnosis not present

## 2023-04-11 DIAGNOSIS — M48061 Spinal stenosis, lumbar region without neurogenic claudication: Secondary | ICD-10-CM | POA: Diagnosis not present

## 2023-04-15 ENCOUNTER — Other Ambulatory Visit: Payer: Self-pay | Admitting: Pulmonary Disease

## 2023-04-16 ENCOUNTER — Other Ambulatory Visit: Payer: Self-pay | Admitting: Neurosurgery

## 2023-04-24 NOTE — Progress Notes (Signed)
Surgical Instructions    Your procedure is scheduled on Thursday, July 11th, 2024.   Report to [facility] Main Entrance "A" at 05:30 A.M., then check in with the Admitting office.  Call this number if you have problems the morning of surgery:  [phone]   If you have any questions prior to your surgery date call [phone]: Open Monday-Friday 8am-4pm If you experience any cold or flu symptoms such as cough, fever, chills, shortness of breath, etc. between now and your scheduled surgery, please notify us at the above number     Remember:  Do not eat or drink after midnight the night before your surgery    Take these medicines the morning of surgery with A SIP OF WATER:   finasteride (PROSCAR)  Fluticasone-Umeclidin-Vilant (TRELEGY ELLIPTA)  levothyroxine (SYNTHROID)  tamsulosin (FLOMAX)   If needed:  albuterol (VENTOLIN HFA)   Please bring all inhalers with you the day of surgery.    As of today, STOP taking any Aspirin (unless otherwise instructed by your surgeon) Aleve, Naproxen, Ibuprofen, Motrin, Advil, Goody's, BC's, all herbal medications, fish oil, and all vitamins.  The day of surgery:          Do not wear jewelry or makeup. Do not wear lotions, powders, cologne or deodorant. Men may shave face and neck. Do not bring valuables to the hospital.  [facility] is not responsible for any belongings or valuables.    Do NOT Smoke (Tobacco/Vaping)  24 hours prior to your procedure  If you use a CPAP at night, you may bring your mask for your overnight stay.   Contacts, glasses, hearing aids, dentures or partials may not be worn into surgery, please bring cases for these belongings   For patients admitted to the hospital, discharge time will be determined by your treatment team.   Patients discharged the day of surgery will not be allowed to drive home, and someone needs to stay with them for 24 hours.   SURGICAL WAITING ROOM VISITATION Patients having surgery  or a procedure may have no more than 2 support people in the waiting area - these visitors may rotate.   Children under the age of 61 must have an adult with them who is not the patient. If the patient needs to stay at the hospital during part of their recovery, the visitor guidelines for inpatient rooms apply. Pre-op nurse will coordinate an appropriate time for 1 support person to accompany patient in pre-op.  This support person may not rotate.   Please refer to [email or web address] for the visitor guidelines for Inpatients (after your surgery is over and you are in a regular room).    Special instructions:    Oral Hygiene is also important to reduce your risk of infection.  Remember - BRUSH YOUR TEETH THE MORNING OF SURGERY WITH YOUR REGULAR TOOTHPASTE     Pre-operative 5 CHG Bath Instructions   You can play a key role in reducing the risk of infection after surgery. Your skin needs to be as free of germs as possible. You can reduce the number of germs on your skin by washing with CHG (chlorhexidine gluconate) soap before surgery. CHG is an antiseptic soap that kills germs and continues to kill germs even after washing.   DO NOT use if you have an allergy to chlorhexidine/CHG or antibacterial soaps. If your skin becomes reddened or irritated, stop using the CHG and notify one of our RNs at [phone].   Please shower with the CHG  soap starting 4 days before surgery using the following schedule:     Please keep in mind the following:  DO NOT shave, including legs and underarms, starting the day of your first shower.   You may shave your face at any point before/day of surgery.  Place clean sheets on your bed the day you start using CHG soap. Use a clean washcloth (not used since being washed) for each shower. DO NOT sleep with pets once you start using the CHG.   CHG Shower Instructions:  If you choose to wash your hair and  private area, wash first with your normal shampoo/soap.  After you use shampoo/soap, rinse your hair and body thoroughly to remove shampoo/soap residue.  Turn the water OFF and apply about 3 tablespoons (45 ml) of CHG soap to a CLEAN washcloth.  Apply CHG soap ONLY FROM YOUR NECK DOWN TO YOUR TOES (washing for 3-5 minutes)  DO NOT use CHG soap on face, private areas, open wounds, or sores.  Pay special attention to the area where your surgery is being performed.  If you are having back surgery, having someone wash your back for you may be helpful. Wait 2 minutes after CHG soap is applied, then you may rinse off the CHG soap.  Pat dry with a clean towel  Put on clean clothes/pajamas   If you choose to wear lotion, please use ONLY the CHG-compatible lotions on the back of this paper.       CHG Compatible Lotions   Aveeno Moisturizing lotion  Cetaphil Moisturizing Cream  Cetaphil Moisturizing Lotion  Clairol Herbal Essence Moisturizing Lotion, Dry Skin  Clairol Herbal Essence Moisturizing Lotion, Extra Dry Skin  Clairol Herbal Essence Moisturizing Lotion, Normal Skin  Curel Age Defying Therapeutic Moisturizing Lotion with Alpha Hydroxy  Curel Extreme Care Body Lotion  Curel Soothing Hands Moisturizing Hand Lotion  Curel Therapeutic Moisturizing Cream, Fragrance-Free  Curel Therapeutic Moisturizing Lotion, Fragrance-Free  Curel Therapeutic Moisturizing Lotion, Original Formula  Eucerin Daily Replenishing Lotion  Eucerin Dry Skin Therapy Plus Alpha Hydroxy Crme  Eucerin Dry Skin Therapy Plus Alpha Hydroxy Lotion  Eucerin Original Crme  Eucerin Original Lotion  Eucerin Plus Crme Eucerin Plus Lotion  Eucerin TriLipid Replenishing Lotion  Keri Anti-Bacterial Hand Lotion  Keri Deep Conditioning Original Lotion Dry Skin Formula Softly Scented  Keri Deep Conditioning Original Lotion, Fragrance Free Sensitive Skin Formula  Keri Lotion Fast Absorbing Fragrance Free Sensitive Skin  Formula  Keri Lotion Fast Absorbing Softly Scented Dry Skin Formula  Keri Original Lotion  Keri Skin Renewal Lotion Keri Silky Smooth Lotion  Keri Silky Smooth Sensitive Skin Lotion  Nivea Body Creamy Conditioning Oil  Nivea Body Extra Enriched Lotion  Nivea Body Original Lotion  Nivea Body Sheer Moisturizing Lotion Nivea Crme  Nivea Skin Firming Lotion  NutraDerm 30 Skin Lotion  NutraDerm Skin Lotion  NutraDerm Therapeutic Skin Cream  NutraDerm Therapeutic Skin Lotion  ProShield Protective Hand Cream  Provon moisturizing lotion    Day of Surgery:  Take a shower with CHG soap. Wear Clean/Comfortable clothing the morning of surgery Do not apply any deodorants/lotions.   Remember to brush your teeth WITH YOUR REGULAR TOOTHPASTE.   If you received a COVID test during your pre-op visit, it is requested that you wear a mask when out in public, stay away from anyone that may not be feeling well, and notify your surgeon if you develop symptoms. If you have been in contact with anyone that has tested positive in the  last 10 days, please notify your surgeon.    Please read over the following fact sheets that you were given.

## 2023-04-25 ENCOUNTER — Other Ambulatory Visit: Payer: Self-pay

## 2023-04-25 ENCOUNTER — Encounter (HOSPITAL_COMMUNITY): Payer: Self-pay

## 2023-04-25 ENCOUNTER — Encounter (HOSPITAL_COMMUNITY)
Admission: RE | Admit: 2023-04-25 | Discharge: 2023-04-25 | Disposition: A | Discharge status: Home / Self Care | Payer: Medicare HMO | Source: Ambulatory Visit | Attending: Neurosurgery | Admitting: Neurosurgery

## 2023-04-25 VITALS — BP 125/83 | HR 66 | Temp 98.3°F | Resp 17 | Ht 71.0 in | Wt 194.7 lb

## 2023-04-25 DIAGNOSIS — I4510 Unspecified right bundle-branch block: Secondary | ICD-10-CM | POA: Insufficient documentation

## 2023-04-25 DIAGNOSIS — Z01818 Encounter for other preprocedural examination: Secondary | ICD-10-CM | POA: Diagnosis not present

## 2023-04-25 HISTORY — DX: Hyperlipidemia, unspecified: E78.5

## 2023-04-25 HISTORY — DX: Chronic obstructive pulmonary disease, unspecified: J44.9

## 2023-04-25 HISTORY — DX: Essential (primary) hypertension: I10

## 2023-04-25 LAB — BASIC METABOLIC PANEL
Anion gap: 9 (ref 5–15)
BUN: 14 mg/dL (ref 8–23)
CO2: 28 mmol/L (ref 22–32)
Calcium: 8.7 mg/dL — ABNORMAL LOW (ref 8.9–10.3)
Chloride: 97 mmol/L — ABNORMAL LOW (ref 98–111)
Creatinine, Ser: 0.87 mg/dL (ref 0.61–1.24)
GFR, Estimated: >60 mL/min (ref >60)
Glucose, Bld: 87 mg/dL (ref 70–99)
Potassium: 4.6 mmol/L (ref 3.5–5.1)
Sodium: 134 mmol/L — ABNORMAL LOW (ref 135–145)

## 2023-04-25 LAB — CBC
HCT: 52.4 % — ABNORMAL HIGH (ref 39.0–52.0)
Hemoglobin: 17.2 g/dL — ABNORMAL HIGH (ref 13.0–17.0)
MCH: 31 pg (ref 26.0–34.0)
MCHC: 32.8 g/dL (ref 30.0–36.0)
MCV: 94.4 fL (ref 80.0–100.0)
Platelets: 158 10*3/uL (ref 150–400)
RBC: 5.55 MIL/uL (ref 4.22–5.81)
RDW: 13.2 % (ref 11.5–15.5)
WBC: 6.4 10*3/uL (ref 4.0–10.5)
nRBC: 0 % (ref 0.0–0.2)

## 2023-04-25 LAB — SURGICAL PCR SCREEN
MRSA, PCR: NEGATIVE
Staphylococcus aureus: NEGATIVE

## 2023-04-25 NOTE — Progress Notes (Signed)
Abnormal labs in [name] today - Hgb 17.2; HCT 52.4 - Thomas Palmer, from Dr. Lovell Sheehan office was notified.

## 2023-04-25 NOTE — Progress Notes (Addendum)
PCP - Alan Mulder, MD Cardiologist - denies  PPM/ICD - denies Device Orders - n/a Rep Notified - n/a  Chest x-ray - n/a EKG - 04/25/23 (hx. of HTN - no medication) Stress Test - denies ECHO - denies Cardiac Cath - 2009 (due to SOB - was negative per patient)  Sleep Study - denies CPAP - n/a  Fasting Blood Sugar - n/a  Blood Thinner Instructions: n/a Patient was instructed: As of today, STOP taking any Aspirin (unless otherwise instructed by your surgeon) Aleve, Naproxen, Ibuprofen, Motrin, Advil, Goody's, BC's, all herbal medications, fish oil, and all vitamins.   ERAS Protcol - n/a   COVID TEST- n/a   Anesthesia review: yes - abnormal EKG in [name]  Patient denies shortness of breath, fever, cough and chest pain at [name] appointment   All instructions explained to the patient, with a verbal understanding of the material. Patient agrees to go over the instructions while at home for a better understanding. Patient also instructed to self quarantine after being tested for COVID-19. The opportunity to ask questions was provided.

## 2023-04-28 ENCOUNTER — Encounter (HOSPITAL_COMMUNITY): Payer: Self-pay

## 2023-05-08 ENCOUNTER — Other Ambulatory Visit: Payer: Self-pay

## 2023-05-08 ENCOUNTER — Ambulatory Visit (HOSPITAL_COMMUNITY): Payer: Medicare HMO

## 2023-05-08 ENCOUNTER — Ambulatory Visit (HOSPITAL_COMMUNITY)
Admission: RE | Admit: 2023-05-08 | Discharge: 2023-05-08 | Disposition: A | Discharge status: Home / Self Care | Payer: Medicare HMO | Attending: Neurosurgery | Admitting: Neurosurgery

## 2023-05-08 ENCOUNTER — Encounter (HOSPITAL_COMMUNITY)
Admission: RE | Disposition: A | Discharge status: Home / Self Care | Payer: Self-pay | Source: Home / Self Care | Attending: Neurosurgery

## 2023-05-08 ENCOUNTER — Ambulatory Visit (HOSPITAL_BASED_OUTPATIENT_CLINIC_OR_DEPARTMENT_OTHER): Payer: Medicare HMO | Admitting: Certified Registered"

## 2023-05-08 ENCOUNTER — Encounter (HOSPITAL_COMMUNITY): Payer: Self-pay | Admitting: Neurosurgery

## 2023-05-08 ENCOUNTER — Ambulatory Visit (HOSPITAL_COMMUNITY): Payer: Medicare HMO | Admitting: Physician Assistant

## 2023-05-08 DIAGNOSIS — E039 Hypothyroidism, unspecified: Secondary | ICD-10-CM

## 2023-05-08 DIAGNOSIS — M5136 Other intervertebral disc degeneration, lumbar region: Secondary | ICD-10-CM | POA: Diagnosis not present

## 2023-05-08 DIAGNOSIS — Z87891 Personal history of nicotine dependence: Secondary | ICD-10-CM | POA: Diagnosis not present

## 2023-05-08 DIAGNOSIS — M4856XA Collapsed vertebra, not elsewhere classified, lumbar region, initial encounter for fracture: Secondary | ICD-10-CM | POA: Insufficient documentation

## 2023-05-08 DIAGNOSIS — K449 Diaphragmatic hernia without obstruction or gangrene: Secondary | ICD-10-CM | POA: Insufficient documentation

## 2023-05-08 DIAGNOSIS — K3580 Unspecified acute appendicitis: Secondary | ICD-10-CM | POA: Diagnosis not present

## 2023-05-08 DIAGNOSIS — Z8711 Personal history of peptic ulcer disease: Secondary | ICD-10-CM | POA: Insufficient documentation

## 2023-05-08 DIAGNOSIS — Z981 Arthrodesis status: Secondary | ICD-10-CM | POA: Diagnosis not present

## 2023-05-08 DIAGNOSIS — J449 Chronic obstructive pulmonary disease, unspecified: Secondary | ICD-10-CM | POA: Diagnosis not present

## 2023-05-08 DIAGNOSIS — M48061 Spinal stenosis, lumbar region without neurogenic claudication: Secondary | ICD-10-CM | POA: Diagnosis not present

## 2023-05-08 DIAGNOSIS — M48062 Spinal stenosis, lumbar region with neurogenic claudication: Secondary | ICD-10-CM | POA: Insufficient documentation

## 2023-05-08 DIAGNOSIS — M1990 Unspecified osteoarthritis, unspecified site: Secondary | ICD-10-CM | POA: Diagnosis not present

## 2023-05-08 DIAGNOSIS — I10 Essential (primary) hypertension: Secondary | ICD-10-CM | POA: Insufficient documentation

## 2023-05-08 DIAGNOSIS — D509 Iron deficiency anemia, unspecified: Secondary | ICD-10-CM | POA: Diagnosis not present

## 2023-05-08 DIAGNOSIS — Z01818 Encounter for other preprocedural examination: Secondary | ICD-10-CM

## 2023-05-08 HISTORY — PX: LUMBAR LAMINECTOMY/DECOMPRESSION MICRODISCECTOMY: SHX5026

## 2023-05-08 SURGERY — LUMBAR LAMINECTOMY/DECOMPRESSION MICRODISCECTOMY 2 LEVELS
Anesthesia: General | Site: Spine Lumbar | Laterality: Bilateral

## 2023-05-08 MED ORDER — BACITRACIN ZINC 500 UNIT/GM EX OINT
TOPICAL_OINTMENT | CUTANEOUS | Status: DC | PRN
  Administered 2023-05-08: 1 via TOPICAL

## 2023-05-08 MED ORDER — CYCLOBENZAPRINE HCL 5 MG PO TABS: 5.0000 mg | ORAL_TABLET | Freq: Three times a day (TID) | ORAL | 1 refills | Status: DC | PRN

## 2023-05-08 MED ORDER — DOCUSATE SODIUM 100 MG PO CAPS: 100.0000 mg | ORAL_CAPSULE | Freq: Two times a day (BID) | ORAL | Status: DC

## 2023-05-08 MED ORDER — FENTANYL CITRATE (PF) 250 MCG/5ML IJ SOLN
INTRAMUSCULAR | Status: AC
  Filled 2023-05-08: qty 5

## 2023-05-08 MED ORDER — MENTHOL 3 MG MT LOZG: 1.0000 | LOZENGE | OROMUCOSAL | Status: DC | PRN

## 2023-05-08 MED ORDER — MORPHINE SULFATE (PF) 4 MG/ML IV SOLN: 4.0000 mg | INTRAVENOUS | Status: DC | PRN

## 2023-05-08 MED ORDER — TAMSULOSIN HCL 0.4 MG PO CAPS: 0.8000 mg | ORAL_CAPSULE | Freq: Every day | ORAL | Status: DC

## 2023-05-08 MED ORDER — PHENYLEPHRINE 80 MCG/ML (10ML) SYRINGE FOR IV PUSH (FOR BLOOD PRESSURE SUPPORT)
PREFILLED_SYRINGE | INTRAVENOUS | Status: AC
  Filled 2023-05-08: qty 10

## 2023-05-08 MED ORDER — SODIUM CHLORIDE 0.9% FLUSH: 3.0000 mL | Freq: Two times a day (BID) | INTRAVENOUS | Status: DC

## 2023-05-08 MED ORDER — BACITRACIN ZINC 500 UNIT/GM EX OINT
TOPICAL_OINTMENT | CUTANEOUS | Status: AC
  Filled 2023-05-08: qty 28.35

## 2023-05-08 MED ORDER — PHENYLEPHRINE HCL-NACL 20-0.9 MG/250ML-% IV SOLN
INTRAVENOUS | Status: AC
  Filled 2023-05-08: qty 750

## 2023-05-08 MED ORDER — ACETAMINOPHEN 325 MG PO TABS: 650.0000 mg | ORAL_TABLET | ORAL | Status: DC | PRN

## 2023-05-08 MED ORDER — PROPOFOL 1000 MG/100ML IV EMUL
INTRAVENOUS | Status: AC
  Filled 2023-05-08: qty 200

## 2023-05-08 MED ORDER — LIDOCAINE 2% (20 MG/ML) 5 ML SYRINGE
INTRAMUSCULAR | Status: AC
  Filled 2023-05-08: qty 5

## 2023-05-08 MED ORDER — LEVOTHYROXINE SODIUM 100 MCG PO TABS: 200.0000 ug | ORAL_TABLET | Freq: Every day | ORAL | Status: DC

## 2023-05-08 MED ORDER — THROMBIN 5000 UNITS EX SOLR
OROMUCOSAL | Status: DC | PRN
  Administered 2023-05-08: 5 mL via TOPICAL

## 2023-05-08 MED ORDER — PHENYLEPHRINE HCL-NACL 20-0.9 MG/250ML-% IV SOLN
INTRAVENOUS | Status: DC | PRN
  Administered 2023-05-08: 20 ug/min via INTRAVENOUS

## 2023-05-08 MED ORDER — BISACODYL 10 MG RE SUPP: 10.0000 mg | Freq: Every day | RECTAL | Status: DC | PRN

## 2023-05-08 MED ORDER — PROPOFOL 10 MG/ML IV BOLUS
INTRAVENOUS | Status: DC | PRN
Start: 2023-05-08 — End: 2023-05-08
  Administered 2023-05-08: 120 mg via INTRAVENOUS

## 2023-05-08 MED ORDER — EPHEDRINE 5 MG/ML INJ
INTRAVENOUS | Status: AC
  Filled 2023-05-08: qty 5

## 2023-05-08 MED ORDER — BUPIVACAINE-EPINEPHRINE (PF) 0.25% -1:200000 IJ SOLN
INTRAMUSCULAR | Status: DC | PRN
  Administered 2023-05-08 (×2): 10 mL

## 2023-05-08 MED ORDER — OXYCODONE HCL 5 MG PO TABS: 5.0000 mg | ORAL_TABLET | ORAL | Status: DC | PRN

## 2023-05-08 MED ORDER — FENTANYL CITRATE (PF) 100 MCG/2ML IJ SOLN: 25.0000 ug | INTRAMUSCULAR | Status: DC | PRN

## 2023-05-08 MED ORDER — BUPIVACAINE-EPINEPHRINE (PF) 0.25% -1:200000 IJ SOLN
INTRAMUSCULAR | Status: AC
  Filled 2023-05-08: qty 30

## 2023-05-08 MED ORDER — ALBUTEROL SULFATE (2.5 MG/3ML) 0.083% IN NEBU: 3.0000 mL | INHALATION_SOLUTION | Freq: Four times a day (QID) | RESPIRATORY_TRACT | Status: DC | PRN

## 2023-05-08 MED ORDER — FINASTERIDE 5 MG PO TABS: 5.0000 mg | ORAL_TABLET | Freq: Every day | ORAL | Status: DC

## 2023-05-08 MED ORDER — OXYCODONE-ACETAMINOPHEN 5-325 MG PO TABS: 1.0000 | ORAL_TABLET | ORAL | 0 refills | Status: DC | PRN

## 2023-05-08 MED ORDER — CYCLOBENZAPRINE HCL 10 MG PO TABS: 10.0000 mg | ORAL_TABLET | Freq: Three times a day (TID) | ORAL | Status: DC | PRN

## 2023-05-08 MED ORDER — CHLORHEXIDINE GLUCONATE CLOTH 2 % EX PADS: 6.0000 | MEDICATED_PAD | Freq: Once | CUTANEOUS | Status: DC

## 2023-05-08 MED ORDER — PHENOL 1.4 % MT LIQD: 1.0000 | OROMUCOSAL | Status: DC | PRN

## 2023-05-08 MED ORDER — LACTATED RINGERS IV SOLN: INTRAVENOUS | Status: DC

## 2023-05-08 MED ORDER — ONDANSETRON HCL 4 MG/2ML IJ SOLN: 4.0000 mg | Freq: Four times a day (QID) | INTRAMUSCULAR | Status: DC | PRN

## 2023-05-08 MED ORDER — 0.9 % SODIUM CHLORIDE (POUR BTL) OPTIME
TOPICAL | Status: DC | PRN
  Administered 2023-05-08: 1000 mL

## 2023-05-08 MED ORDER — ROCURONIUM BROMIDE 10 MG/ML (PF) SYRINGE
PREFILLED_SYRINGE | INTRAVENOUS | Status: AC
  Filled 2023-05-08: qty 10

## 2023-05-08 MED ORDER — SODIUM CHLORIDE 0.9% FLUSH: 3.0000 mL | INTRAVENOUS | Status: DC | PRN

## 2023-05-08 MED ORDER — DEXAMETHASONE SODIUM PHOSPHATE 10 MG/ML IJ SOLN
INTRAMUSCULAR | Status: AC
  Filled 2023-05-08: qty 1

## 2023-05-08 MED ORDER — OXYCODONE HCL 5 MG PO TABS: 5.0000 mg | ORAL_TABLET | Freq: Once | ORAL | Status: DC | PRN

## 2023-05-08 MED ORDER — OXYCODONE HCL 5 MG/5ML PO SOLN: 5.0000 mg | Freq: Once | ORAL | Status: DC | PRN

## 2023-05-08 MED ORDER — UMECLIDINIUM BROMIDE 62.5 MCG/ACT IN AEPB
1.0000 | INHALATION_SPRAY | Freq: Every day | RESPIRATORY_TRACT | Status: DC
  Filled 2023-05-08: qty 7

## 2023-05-08 MED ORDER — ROCURONIUM BROMIDE 10 MG/ML (PF) SYRINGE
PREFILLED_SYRINGE | INTRAVENOUS | Status: DC | PRN
  Administered 2023-05-08: 20 mg via INTRAVENOUS
  Administered 2023-05-08 (×3): 10 mg via INTRAVENOUS
  Administered 2023-05-08: 50 mg via INTRAVENOUS

## 2023-05-08 MED ORDER — LEVOTHYROXINE SODIUM 25 MCG PO TABS: 50.0000 ug | ORAL_TABLET | Freq: Every day | ORAL | Status: DC

## 2023-05-08 MED ORDER — EPHEDRINE SULFATE-NACL 50-0.9 MG/10ML-% IV SOSY
PREFILLED_SYRINGE | INTRAVENOUS | Status: DC | PRN
  Administered 2023-05-08: 5 mg via INTRAVENOUS
  Administered 2023-05-08: 10 mg via INTRAVENOUS

## 2023-05-08 MED ORDER — FENTANYL CITRATE (PF) 250 MCG/5ML IJ SOLN
INTRAMUSCULAR | Status: DC | PRN
  Administered 2023-05-08 (×2): 100 ug via INTRAVENOUS
  Administered 2023-05-08: 50 ug via INTRAVENOUS

## 2023-05-08 MED ORDER — LIDOCAINE 2% (20 MG/ML) 5 ML SYRINGE
INTRAMUSCULAR | Status: DC | PRN
  Administered 2023-05-08: 80 mg via INTRAVENOUS

## 2023-05-08 MED ORDER — ACETAMINOPHEN 650 MG RE SUPP: 650.0000 mg | RECTAL | Status: DC | PRN

## 2023-05-08 MED ORDER — ONDANSETRON HCL 4 MG PO TABS: 4.0000 mg | ORAL_TABLET | Freq: Four times a day (QID) | ORAL | Status: DC | PRN

## 2023-05-08 MED ORDER — THROMBIN 5000 UNITS EX SOLR
CUTANEOUS | Status: AC
  Filled 2023-05-08: qty 5000

## 2023-05-08 MED ORDER — FLUTICASONE FUROATE-VILANTEROL 100-25 MCG/ACT IN AEPB
1.0000 | INHALATION_SPRAY | Freq: Every day | RESPIRATORY_TRACT | Status: DC
  Filled 2023-05-08: qty 28

## 2023-05-08 MED ORDER — CEFAZOLIN SODIUM-DEXTROSE 2-4 GM/100ML-% IV SOLN
2.0000 g | INTRAVENOUS | Status: AC
  Administered 2023-05-08: 2 g via INTRAVENOUS
  Filled 2023-05-08: qty 100

## 2023-05-08 MED ORDER — SUGAMMADEX SODIUM 200 MG/2ML IV SOLN
INTRAVENOUS | Status: DC | PRN
  Administered 2023-05-08: 200 mg via INTRAVENOUS

## 2023-05-08 MED ORDER — OXYCODONE HCL 5 MG PO TABS: 10.0000 mg | ORAL_TABLET | ORAL | Status: DC | PRN

## 2023-05-08 MED ORDER — CEFAZOLIN SODIUM-DEXTROSE 2-4 GM/100ML-% IV SOLN
2.0000 g | Freq: Three times a day (TID) | INTRAVENOUS | Status: DC
  Administered 2023-05-08: 2 g via INTRAVENOUS
  Filled 2023-05-08: qty 100

## 2023-05-08 MED ORDER — ONDANSETRON HCL 4 MG/2ML IJ SOLN
INTRAMUSCULAR | Status: AC
  Filled 2023-05-08: qty 2

## 2023-05-08 MED ORDER — PROPOFOL 10 MG/ML IV BOLUS
INTRAVENOUS | Status: AC
  Filled 2023-05-08: qty 20

## 2023-05-08 MED ORDER — CHLORHEXIDINE GLUCONATE 0.12 % MT SOLN
15.0000 mL | Freq: Once | OROMUCOSAL | Status: AC
  Administered 2023-05-08: 15 mL via OROMUCOSAL
  Filled 2023-05-08: qty 15

## 2023-05-08 MED ORDER — SODIUM CHLORIDE 0.9 % IV SOLN: 250.0000 mL | INTRAVENOUS | Status: DC

## 2023-05-08 MED ORDER — ACETAMINOPHEN 500 MG PO TABS
1000.0000 mg | ORAL_TABLET | Freq: Four times a day (QID) | ORAL | Status: DC
  Administered 2023-05-08: 1000 mg via ORAL
  Filled 2023-05-08: qty 2

## 2023-05-08 MED ORDER — DEXAMETHASONE SODIUM PHOSPHATE 10 MG/ML IJ SOLN
INTRAMUSCULAR | Status: DC | PRN
  Administered 2023-05-08: 5 mg via INTRAVENOUS

## 2023-05-08 MED ORDER — DOCUSATE SODIUM 100 MG PO CAPS: 100.0000 mg | ORAL_CAPSULE | Freq: Two times a day (BID) | ORAL | 0 refills | Status: DC

## 2023-05-08 MED ORDER — ORAL CARE MOUTH RINSE: 15.0000 mL | Freq: Once | OROMUCOSAL | Status: AC

## 2023-05-08 MED ORDER — ONDANSETRON HCL 4 MG/2ML IJ SOLN
INTRAMUSCULAR | Status: DC | PRN
  Administered 2023-05-08: 4 mg via INTRAVENOUS

## 2023-05-08 MED ORDER — PHENYLEPHRINE 80 MCG/ML (10ML) SYRINGE FOR IV PUSH (FOR BLOOD PRESSURE SUPPORT)
PREFILLED_SYRINGE | INTRAVENOUS | Status: DC | PRN
  Administered 2023-05-08 (×3): 80 ug via INTRAVENOUS

## 2023-05-08 SURGICAL SUPPLY — 45 items
APL SKNCLS STERI-STRIP NONHPOA (GAUZE/BANDAGES/DRESSINGS) ×1
BAG COUNTER SPONGE SURGICOUNT (BAG) ×1 IMPLANT
BAG SPNG CNTER NS LX DISP (BAG) ×1
BENZOIN TINCTURE PRP APPL 2/3 (GAUZE/BANDAGES/DRESSINGS) ×1 IMPLANT
BLADE CLIPPER SURG (BLADE) IMPLANT
BUR MATCHSTICK NEURO 3.0 LAGG (BURR) ×1 IMPLANT
BUR PRECISION FLUTE 6.0 (BURR) ×1 IMPLANT
CANISTER SUCT 3000ML PPV (MISCELLANEOUS) ×1 IMPLANT
DRAPE LAPAROTOMY 100X72X124 (DRAPES) ×1 IMPLANT
DRAPE MICROSCOPE SLANT 54X150 (MISCELLANEOUS) ×1 IMPLANT
DRAPE SURG 17X23 STRL (DRAPES) ×4 IMPLANT
DRSG OPSITE POSTOP 4X6 (GAUZE/BANDAGES/DRESSINGS) ×1 IMPLANT
ELECT BLADE 4.0 EZ CLEAN MEGAD (MISCELLANEOUS) ×1
ELECT REM PT RETURN 9FT ADLT (ELECTROSURGICAL) ×1
ELECTRODE BLDE 4.0 EZ CLN MEGD (MISCELLANEOUS) ×1 IMPLANT
ELECTRODE REM PT RTRN 9FT ADLT (ELECTROSURGICAL) ×1 IMPLANT
GAUZE 4X4 16PLY ~~LOC~~+RFID DBL (SPONGE) IMPLANT
GAUZE SPONGE 4X4 12PLY STRL (GAUZE/BANDAGES/DRESSINGS) ×1 IMPLANT
GLOVE BIO SURGEON STRL SZ 6 (GLOVE) ×1 IMPLANT
GLOVE BIO SURGEON STRL SZ8 (GLOVE) ×1 IMPLANT
GLOVE BIO SURGEON STRL SZ8.5 (GLOVE) ×1 IMPLANT
GLOVE BIOGEL PI IND STRL 6.5 (GLOVE) ×1 IMPLANT
GLOVE EXAM NITRILE XL STR (GLOVE) IMPLANT
GOWN STRL REUS W/ TWL LRG LVL3 (GOWN DISPOSABLE) ×1 IMPLANT
GOWN STRL REUS W/ TWL XL LVL3 (GOWN DISPOSABLE) ×1 IMPLANT
GOWN STRL REUS W/TWL 2XL LVL3 (GOWN DISPOSABLE) IMPLANT
GOWN STRL REUS W/TWL LRG LVL3 (GOWN DISPOSABLE) ×1
GOWN STRL REUS W/TWL XL LVL3 (GOWN DISPOSABLE) ×1
HEMOSTAT POWDER KIT SURGIFOAM (HEMOSTASIS) ×1 IMPLANT
KIT BASIN OR (CUSTOM PROCEDURE TRAY) ×1 IMPLANT
KIT TURNOVER KIT B (KITS) ×1 IMPLANT
NDL HYPO 22X1.5 SAFETY MO (MISCELLANEOUS) ×1 IMPLANT
NEEDLE HYPO 22X1.5 SAFETY MO (MISCELLANEOUS) ×1 IMPLANT
NS IRRIG 1000ML POUR BTL (IV SOLUTION) ×1 IMPLANT
PACK LAMINECTOMY NEURO (CUSTOM PROCEDURE TRAY) ×1 IMPLANT
PAD ARMBOARD 7.5X6 YLW CONV (MISCELLANEOUS) ×3 IMPLANT
PATTIES SURGICAL .5 X1 (DISPOSABLE) IMPLANT
SPONGE SURGIFOAM ABS GEL SZ50 (HEMOSTASIS) IMPLANT
STRIP CLOSURE SKIN 1/2X4 (GAUZE/BANDAGES/DRESSINGS) ×1 IMPLANT
SUT VIC AB 1 CT1 18XBRD ANBCTR (SUTURE) ×2 IMPLANT
SUT VIC AB 1 CT1 8-18 (SUTURE) ×2
SUT VIC AB 2-0 CP2 18 (SUTURE) ×2 IMPLANT
TOWEL GREEN STERILE (TOWEL DISPOSABLE) ×1 IMPLANT
TOWEL GREEN STERILE FF (TOWEL DISPOSABLE) ×1 IMPLANT
WATER STERILE IRR 1000ML POUR (IV SOLUTION) ×1 IMPLANT

## 2023-05-08 NOTE — Progress Notes (Signed)
PT Cancellation Note  Patient Details Name: Thomas Palmer. MRN: [id number] DOB: 02/19/1941   Cancelled Treatment:    Reason Eval/Treat Not Completed: PT screened, no needs identified, will sign off - OT saw pt, states no acute PT needs at this time and completed all goals for OT. PT to sign off.   Marye Round, PT DPT Acute Rehabilitation Services Secure Chat Preferred  Office [phone]    Truddie Coco 05/08/2023, 2:30 PM

## 2023-05-08 NOTE — Discharge Instructions (Signed)
Wound Care Keep incision covered and dry until post op day 3. You may remove the Honeycomb dressing on post op day 3. Leave steri-strips on back.  They will fall off by themselves. Do not put any creams, lotions, or ointments on incision. You are fine to shower. Let water run over incision and pat dry.  Activity Walk each and every day, increasing distance each day. No lifting greater than 5 lbs.  Avoid excessive back motion. No driving for 2 weeks; may ride as a passenger locally.  Diet Resume your normal diet.   Return to Work Will be discussed at your follow up appointment.  Call Your Doctor If Any of These Occur Redness, drainage, or swelling at the wound.  Temperature greater than 101 degrees. Severe pain not relieved by pain medication. Incision starts to come apart.  Follow Up Appt Call [phone] today for appointment in 2-3 weeks if you don't already have one or for any problems. 

## 2023-05-08 NOTE — Discharge Summary (Signed)
Physician Discharge Summary     Providing Compassionate, Quality Care - Together   Patient ID: Thomas Palmer. MRN: [id number] DOB/AGE: January 16, 1941 82 y.o.  Admit date: 05/08/2023 Discharge date: 05/08/2023  Admission Diagnoses: Spinal stenosis with neurogenic claudication  Discharge Diagnoses:  Principal Problem:   Spinal stenosis of lumbar region with neurogenic claudication   Discharged Condition: good  Hospital Course: Patient underwent l3-4, L4-5 laminotomies/foraminotomies by Dr. Lovell Sheehan on 05/08/2023. He was admitted to [facility] following recovery from anesthesia in the PACU. His postoperative course has been uncomplicated. He has worked with both physical and occupational therapies who feel the patient is ready for discharge home. He is ambulating independently and without difficulty. He is tolerating a normal diet. He is not having any bowel or bladder dysfunction. His pain is well-controlled with oral pain medication. He is ready for discharge home.   Consults: PT/OT  Significant Diagnostic Studies: radiology: DG Lumbar Spine 1 View  Result Date: 05/08/2023 CLINICAL DATA:  Elective surgery. EXAM: LUMBAR SPINE - 1 VIEW COMPARISON:  Radiographs 12/31/2022 FINDINGS: Portable cross-table lateral view of the lumbar spine obtained in the operating room. Assuming L5 is the lower most lumbar vertebra, surgical instrument localizes posteriorly over the L4 level. Prior L1 kyphoplasty. IMPRESSION: Surgical instrument localizes posteriorly over the L4 level. Electronically Signed   By: Narda Rutherford M.D.   On: 05/08/2023 16:52     Treatments: surgery: Bilateral L3-4 and L4-5 laminotomy/foraminotomies using micro-dissection   Discharge Exam: Blood pressure (!) 152/78, pulse 67, temperature 97.7 F (36.5 C), resp. rate 18, height 5\' 11"  (1.803 m), weight 88.5 kg, SpO2 98%.  Per report: Alert and oriented x 4 PERRLA CN II-XII grossly intact MAE, Strength and sensation  intact Incision is covered with Honeycomb dressing and Steri Strips; Dressing is clean, dry, and intact   Disposition: Discharge disposition: 01-Home or Self Care       Discharge Instructions     Call MD for:  difficulty breathing, headache or visual disturbances   Complete by: As directed    Call MD for:  hives   Complete by: As directed    Call MD for:  persistant nausea and vomiting   Complete by: As directed    Call MD for:  redness, tenderness, or signs of infection (pain, swelling, redness, odor or green/yellow discharge around incision site)   Complete by: As directed    Call MD for:  severe uncontrolled pain   Complete by: As directed    Call MD for:  temperature >100.4   Complete by: As directed    Diet - low sodium heart healthy   Complete by: As directed    If the dressing is still on your incision site when you go home, remove it on the third day after your surgery date. Remove dressing if it begins to fall off, or if it is dirty or damaged before the third day.   Complete by: As directed    Increase activity slowly   Complete by: As directed       Allergies as of 05/08/2023       Reactions   Iodine Nausea Only   Contrast Dye - causes severe nausea   Contrast Media [iodinated Contrast Media] Nausea And Vomiting        Medication List     STOP taking these medications    naproxen sodium 220 MG tablet Commonly known as: ALEVE       TAKE these medications    albuterol  108 (90 Base) MCG/ACT inhaler Commonly known as: VENTOLIN HFA Inhale 2 puffs into the lungs every 6 (six) hours as needed for shortness of breath or wheezing.   B-12 PO Take 1 tablet by mouth daily.   cyclobenzaprine 5 MG tablet Commonly known as: FLEXERIL Take 1 tablet (5 mg total) by mouth 3 (three) times daily as needed for muscle spasms.   docusate sodium 100 MG capsule Commonly known as: COLACE Take 1 capsule (100 mg total) by mouth 2 (two) times daily.   finasteride 5  MG tablet Commonly known as: PROSCAR Take 5 mg by mouth daily.   levothyroxine 200 MCG tablet Commonly known as: SYNTHROID Take 200 mcg by mouth daily before breakfast. Take with 50 mcg to equal 250 mcg daily   levothyroxine 50 MCG tablet Commonly known as: SYNTHROID Take 50 mcg by mouth daily before breakfast. Take with 200 mcg to equal 250 mcg daily   MELATONIN PO Take 1 tablet by mouth at bedtime as needed (sleep).   PreserVision AREDS 2 Caps Take 1 capsule by mouth 2 (two) times daily.   multivitamin with minerals tablet Take 1 tablet by mouth daily.   oxyCODONE-acetaminophen 5-325 MG tablet Commonly known as: Percocet Take 1 tablet by mouth every 4 (four) hours as needed for severe pain.   tadalafil 20 MG tablet Commonly known as: CIALIS Take 20 mg by mouth daily as needed for erectile dysfunction.   tamsulosin 0.4 MG Caps capsule Commonly known as: FLOMAX Take 0.8 mg by mouth daily.   TESTOSTERONE COMPOUNDING KIT TD Place 1.5 mLs onto the skin daily. TESTOSTERONE 75MG /ML VERSATILE CR  1.5 ml = 6 clicks   Trelegy Ellipta 100-62.5-25 MCG/ACT Aepb Generic drug: Fluticasone-Umeclidin-Vilant Inhale 1 puff into the lungs daily.   VITAMIN C PO Take 1 tablet by mouth daily.               Discharge Care Instructions  (From admission, onward)           Start     Ordered   05/08/23 0000  If the dressing is still on your incision site when you go home, remove it on the third day after your surgery date. Remove dressing if it begins to fall off, or if it is dirty or damaged before the third day.        05/08/23 1507            Follow-up Information     Tressie Stalker, MD. Go on 05/30/2023.   Specialty: Neurosurgery Why: First post op appointment is o 05/30/2023 at 1:15 PM. Contact information: [address] [place] [address] [phone]                 Signed: Val Eagle, DNP, AGNP-C Nurse  Practitioner  [facility] [address], [address], [place], [place] [address] P: [phone]    F: [phone]  05/08/2023, 3:08 PM

## 2023-05-08 NOTE — Evaluation (Signed)
Occupational Therapy Evaluation Patient Details Name: Thomas Palmer. MRN: [id number] DOB: Feb 02, 1941 Today's Date: 05/08/2023   History of Present Illness 82 year old white male who has complained of back pain worse with standing.  He failed medical management and is now s/p L3-L4 and L4-L5 laminotomy 05/08/23.   Clinical Impression   Pt educated on POB precautions, by end of session he recalled 3/3 and demonstrated good use of body mechanics to complete functional transfers and dressing/bathing of the LB. Pt ambulating with supervision but discussed using RW upon DC to support balance a bit better initially, also discussed with pt that hey can use their [facility] as a shower chair and/or place it over the toilet to help with STS. Ppt presenting at or near functional baseline and has no further acute skilled OT needs at this time. No follow-up OT recommended.       Recommendations for follow up therapy are one component of a multi-disciplinary discharge planning process, led by the attending physician.  Recommendations may be updated based on patient status, additional functional criteria and insurance authorization.   Assistance Recommended at Discharge Set up Supervision/Assistance  Patient can return home with the following Assistance with cooking/housework    Functional Status Assessment  Patient has not had a recent decline in their functional status  Equipment Recommendations  None recommended by OT (Pt has rec DME)    Recommendations for Other Services       Precautions / Restrictions Precautions Precautions: Back Precaution Booklet Issued: Yes (comment) Restrictions Weight Bearing Restrictions: No      Mobility Bed Mobility Overal bed mobility: Needs Assistance Bed Mobility: Supine to Sit, Sit to Supine     Supine to sit: Supervision Sit to supine: Supervision   General bed mobility comments: Educated pt on log roll, pt prefers to do more of a helicopter spin  to complete bed mobility    Transfers Overall transfer level: Needs assistance Equipment used: Straight cane Transfers: Sit to/from Stand Sit to Stand: Supervision                  Balance Overall balance assessment: Mild deficits observed, not formally tested                                         ADL either performed or assessed with clinical judgement   ADL Overall ADL's : At baseline                                       General ADL Comments: Pt completing hall level ambulation with SPC and with RW, both at supervision level. Using figure four technique to don/doff socks and complete LB bathing. Educated pt on POB precautions, pt close to functional baseline     Financial controller      Pertinent Vitals/Pain Pain Assessment Pain Assessment: No/denies pain     Hand Dominance     Extremity/Trunk Assessment Upper Extremity Assessment Upper Extremity Assessment: Overall WFL for tasks assessed   Lower Extremity Assessment Lower Extremity Assessment: Overall WFL for tasks assessed (Tingling in bottom of L foot that was there for several years following an achilles injury)   Cervical / Trunk Assessment Cervical / Trunk Assessment: Back Surgery  Communication Communication Communication: No difficulties   Cognition Arousal/Alertness: Awake/alert Behavior During Therapy: WFL for tasks assessed/performed Overall Cognitive Status: Within Functional Limits for tasks assessed                                       General Comments       Exercises     Shoulder Instructions      Home Living Family/patient expects to be discharged to:: Private residence Living Arrangements: Spouse/significant other Available Help at Discharge: Family;Available 24 hours/day Type of Home: House Home Access: Stairs to enter [employer] of Steps: 1   Home Layout: One level     Bathroom  Shower/Tub: Producer, television/film/video: Standard Bathroom Accessibility: Yes   Home Equipment: Agricultural consultant (2 wheels);Cane - single point;BSC/3in1          Prior Functioning/Environment Prior Level of Function : Independent/Modified Independent;Driving             Mobility Comments: ambulates with SPC ADLs Comments: ind        OT Problem List: Pain      OT Treatment/Interventions:      OT Goals(Current goals can be found in the care plan section) Acute Rehab OT Goals Patient Stated Goal: To go home OT Goal Formulation: With patient Time For Goal Achievement: 05/22/23 Potential to Achieve Goals: Good  OT Frequency:      Co-evaluation              AM-PAC OT "6 Clicks" Daily Activity     Outcome Measure Help from another person eating meals?: None Help from another person taking care of personal grooming?: A Little Help from another person toileting, which includes using toliet, bedpan, or urinal?: A Little Help from another person bathing (including washing, rinsing, drying)?: A Little Help from another person to put on and taking off regular upper body clothing?: None Help from another person to put on and taking off regular lower body clothing?: A Little 6 Click Score: 20   End of Session Equipment Utilized During Treatment: Gait belt;Rolling walker (2 wheels);Other (comment) [facility]) Nurse Communication: Mobility status  Activity Tolerance: Patient tolerated treatment well Patient left: in bed;with call bell/phone within reach;with family/visitor present  OT Visit Diagnosis: Pain Pain - part of body:  (back)                Time: 1610-9604 OT Time Calculation (min): 19 min Charges:  OT General Charges $OT Visit: 1 Visit OT Evaluation $OT Eval Low Complexity: 1 Low  05/08/2023  AB, OTR/L  Acute Rehabilitation Services  Office: [phone]   Tristan Schroeder 05/08/2023, 3:06 PM

## 2023-05-08 NOTE — Anesthesia Preprocedure Evaluation (Signed)
Anesthesia Evaluation  Patient identified by MRN, date of birth, ID band Patient awake    Reviewed: Allergy & Precautions, H&P , NPO status , Patient's Chart, lab work & pertinent test results  Airway Mallampati: II   Neck ROM: full    Dental   Pulmonary COPD, former smoker   breath sounds clear to auscultation       Cardiovascular hypertension,  Rhythm:regular Rate:Normal     Neuro/Psych  PSYCHIATRIC DISORDERS  Depression       GI/Hepatic hiatal hernia, PUD,,,  Endo/Other  Hypothyroidism    Renal/GU      Musculoskeletal  (+) Arthritis ,    Abdominal   Peds  Hematology   Anesthesia Other Findings   Reproductive/Obstetrics                             Anesthesia Physical Anesthesia Plan  ASA: 3  Anesthesia Plan: General   Post-op Pain Management:    Induction: Intravenous  PONV Risk Score and Plan: 2 and Ondansetron, Dexamethasone and Treatment may vary due to age or medical condition  Airway Management Planned: Oral ETT  Additional Equipment:   Intra-op Plan:   Post-operative Plan: Extubation in OR  Informed Consent: I have reviewed the patients History and Physical, chart, labs and discussed the procedure including the risks, benefits and alternatives for the proposed anesthesia with the patient or authorized representative who has indicated his/her understanding and acceptance.     Dental advisory given  Plan Discussed with: CRNA, Anesthesiologist and Surgeon  Anesthesia Plan Comments:        Anesthesia Quick Evaluation

## 2023-05-08 NOTE — Plan of Care (Signed)
Pt doing well. Pt and wife given D/C instructions with verbal understanding. Rx's were sent to the pharmacy by MD. Pt's incision is clean and dry with no sign of infection. Pt's IVs were removed prior to D/C. Pt D/C'd home via wheelchair per MD order. Pt is stable @ D/C and has no other needs at this time. Rema Fendt, RN

## 2023-05-08 NOTE — Anesthesia Procedure Notes (Signed)
Procedure Name: Intubation Date/Time: 05/08/2023 7:42 AM  Performed by: Achille Rich, MDPre-anesthesia Checklist: Patient identified, Emergency Drugs available, Suction available and Patient being monitored Patient Re-evaluated:Patient Re-evaluated prior to induction Oxygen Delivery Method: Circle system utilized Preoxygenation: Pre-oxygenation with 100% oxygen Induction Type: IV induction Ventilation: Mask ventilation without difficulty Laryngoscope Size: Mac and 4 Grade View: Grade I Tube type: Oral Number of attempts: 1 Airway Equipment and Method: Stylet and Oral airway Placement Confirmation: ETT inserted through vocal cords under direct vision, positive ETCO2 and breath sounds checked- equal and bilateral Secured at: 23 cm Tube secured with: Tape Dental Injury: Teeth and Oropharynx as per pre-operative assessment

## 2023-05-08 NOTE — H&P (Signed)
Subjective: The patient is a 82 year old white male who is complaining of back pain worsened with standing walking.  He has failed medical management.  He was worked up with a lumbar MRI which demonstrated spinal stenosis most prominent at L3-4 and L4-5.  I discussed the various treatment options with him.  He has decided proceed with surgery.  Past Medical History:  Diagnosis Date   Arthritis    "joints in hips and fingers; shoulders too" (07/13/2013)   Chronic lower back pain    COPD (chronic obstructive pulmonary disease) (HCC)    GOLD stage 3   Depression    Erythrocytosis 01/01/2021   H/O hiatal hernia    Hyperlipidemia    Hypertension    Hypothyroidism    Pneumonia 08/2011    Past Surgical History:  Procedure Laterality Date   ACHILLES TENDON REPAIR Left 2021   APPENDECTOMY     BACK SURGERY     kyphoplasty   CARDIAC CATHETERIZATION  ~ 2009   COLONOSCOPY WITH PROPOFOL N/A 05/03/2019   Procedure: COLONOSCOPY WITH PROPOFOL;  Surgeon: Christena Deem, MD;  Location: [facility] ENDOSCOPY;  Service: Endoscopy;  Laterality: N/A;   FIXATION KYPHOPLASTY LUMBAR SPINE  ~ 2012   "L1" (07/13/2013)   JOINT REPLACEMENT     LAPAROSCOPIC APPENDECTOMY N/A 02/22/2018   Procedure: APPENDECTOMY LAPAROSCOPIC;  Surgeon: Ancil Linsey, MD;  Location: [facility] ORS;  Service: General;  Laterality: N/A;   TOTAL HIP ARTHROPLASTY Left 07/12/2013   Procedure: TOTAL HIP ARTHROPLASTY;  Surgeon: Dannielle Huh, MD;  Location: [facility] OR;  Service: Orthopedics;  Laterality: Left;   TOTAL HIP ARTHROPLASTY Right 05/17/2002    Allergies  Allergen Reactions   Iodine Nausea Only    Contrast Dye - causes severe nausea   Contrast Media [Iodinated Contrast Media] Nausea And Vomiting    Social History   Tobacco Use   Smoking status: Former    Types: Pipe    Quit date: 07/07/1989    Years since quitting: 33.8   Smokeless tobacco: Former    Types: Chew    Quit date: 10/28/1989  Substance Use Topics   Alcohol use: Yes     Alcohol/week: 5.0 standard drinks of alcohol    Types: 5 Cans of beer per week    Family History  Problem Relation Age of Onset   Skin cancer Father    Stomach cancer Maternal Grandmother    Prior to Admission medications   Medication Sig Start Date End Date Taking? Authorizing Provider  albuterol (VENTOLIN HFA) 108 (90 Base) MCG/ACT inhaler Inhale 2 puffs into the lungs every 6 (six) hours as needed for shortness of breath or wheezing.   Yes [provider]  Ascorbic Acid (VITAMIN C PO) Take 1 tablet by mouth daily.   Yes [provider]  Cyanocobalamin (B-12 PO) Take 1 tablet by mouth daily.   Yes [provider]  finasteride (PROSCAR) 5 MG tablet Take 5 mg by mouth daily.   Yes [provider]  Fluticasone-Umeclidin-Vilant (TRELEGY ELLIPTA) 100-62.5-25 MCG/ACT AEPB Inhale 1 puff into the lungs daily.   Yes [provider]  levothyroxine (SYNTHROID) 50 MCG tablet Take 50 mcg by mouth daily before breakfast. Take with 200 mcg to equal 250 mcg daily 03/12/21 04/22/23 Yes [provider]  levothyroxine (SYNTHROID, LEVOTHROID) 200 MCG tablet Take 200 mcg by mouth daily before breakfast. Take with 50 mcg to equal 250 mcg daily   Yes [provider]  MELATONIN PO Take 1 tablet by mouth  at bedtime as needed (sleep).   Yes [provider]  Multiple Vitamins-Minerals (MULTIVITAMIN WITH MINERALS) tablet Take 1 tablet by mouth daily.   Yes [provider]  Multiple Vitamins-Minerals (PRESERVISION AREDS 2) CAPS Take 1 capsule by mouth 2 (two) times daily.   Yes [provider]  naproxen sodium (ALEVE) 220 MG tablet Take 440 mg by mouth daily as needed (pain).   Yes [provider]  tadalafil (CIALIS) 20 MG tablet Take 20 mg by mouth daily as needed for erectile dysfunction.   Yes [provider]  tamsulosin (FLOMAX) 0.4 MG CAPS capsule Take 0.8 mg by mouth daily. 06/27/21  Yes [provider]  TESTOSTERONE COMPOUNDING KIT TD Place 1.5 mLs onto the skin daily. TESTOSTERONE 75MG /ML VERSATILE CR  1.5 ml = 6 clicks   Yes [provider]     Review of Systems  Positive ROS: As above  All other systems have been reviewed and were otherwise negative with the exception of those mentioned in the HPI and as above.  Objective: Vital signs in last 24 hours: Temp:  [97.8 F (36.6 C)] 97.8 F (36.6 C) (07/11 0545) Pulse Rate:  [64] 64 (07/11 0545) Resp:  [18] 18 (07/11 0545) BP: (125)/(85) 125/85 (07/11 0545) SpO2:  [96 %] 96 % (07/11 0545) Weight:  [88.5 kg] 88.5 kg (07/11 0545) Estimated body mass index is 27.2 kg/m as calculated from the following:   Height as of this encounter: 5\' 11"  (1.803 m).   Weight as of this encounter: 88.5 kg.   General Appearance: Alert Head: Normocephalic, without obvious abnormality, atraumatic Eyes: PERRL, conjunctiva/corneas clear, EOM's intact,    Ears: Normal  Throat: Normal  Neck: Supple, Back: unremarkable Lungs: Clear to auscultation bilaterally, respirations unlabored Heart: Regular rate and rhythm, no murmur, rub or gallop Abdomen: Soft, non-tender Extremities: Extremities normal, atraumatic, no cyanosis or edema Skin: unremarkable  NEUROLOGIC:   Mental status: alert and oriented,Motor Exam - grossly normal Sensory Exam - grossly normal Reflexes:  Coordination - grossly normal Gait - grossly normal Balance - grossly normal Cranial Nerves: I: smell Not tested  II: visual acuity  OS: Normal  OD: Normal   II: visual fields Full to confrontation  II: pupils Equal, round, reactive to light  III,VII: ptosis None  III,IV,VI: extraocular muscles  Full ROM  V: mastication Normal  V: facial light touch sensation  Normal  V,VII: corneal reflex  Present  VII: facial muscle function - upper  Normal  VII: facial muscle function - lower Normal  VIII: hearing Not tested  IX: soft palate elevation  Normal   IX,X: gag reflex Present  XI: trapezius strength  5/5  XI: sternocleidomastoid strength 5/5  XI: neck flexion strength  5/5  XII: tongue strength  Normal    Data Review Lab Results  Component Value Date   WBC 6.4 04/25/2023   HGB 17.2 (H) 04/25/2023   HCT 52.4 (H) 04/25/2023   MCV 94.4 04/25/2023   PLT 158 04/25/2023   Lab Results  Component Value Date   NA 134 (L) 04/25/2023   K 4.6 04/25/2023   CL 97 (L) 04/25/2023   CO2 28 04/25/2023   BUN 14 04/25/2023   CREATININE 0.87 04/25/2023   GLUCOSE 87 04/25/2023   Lab Results  Component Value Date   INR 0.95 07/07/2013    Assessment/Plan: Spinal stenosis, lumbago, neurogenic claudication: I have discussed the situation with the patient and his wife.  I reviewed his imaging studies  with him and pointed out the abnormalities.  We have discussed the various treatment options including surgery.  I have described the surgical treatment option of bilateral L3-4 and L4-5 laminotomy/foraminotomies.  I have shown him surgical models.  I have given him a surgical pamphlet.  We have discussed the risk, benefits, alternatives, expected postop course, and likelihood of achieving our goals with surgery.  I have answered all the patient's questions.  He has decided proceed with surgery.   Cristi Loron 05/08/2023 7:24 AM

## 2023-05-08 NOTE — Op Note (Signed)
Brief history: The patient is an 82 year old white male who has complained of back pain worse with standing.  He failed medical management.  He was worked up with a lumbar MRI which demonstrated spinal stenosis most prominent at L3-4 and L4-5.  I discussed the various treatment options with him.  He has decided to proceed with surgery.  Preoperative diagnosis: Lumbar spinal stenosis, neurogenic claudication, chronic lumbar compression fracture, lumbago  Postoperative diagnosis: The same  Procedure: Bilateral L3-4 and L4-5 laminotomy/foraminotomies  using micro-dissection  Surgeon: Dr. Delma Officer  Asst.: Hildred Priest, NP  Anesthesia: Gen. endotracheal  Estimated blood loss: 75 cc  Drains: None  Complications: None  Description of procedure: The patient was brought to the operating room by the anesthesia team. General endotracheal anesthesia was induced. The patient was turned to the prone position on the Wilson frame. The patient's lumbosacral region was then prepared with Betadine scrub and Betadine solution. Sterile drapes were applied.  I then injected the area to be incised with Marcaine with epinephrine solution. I then used a scalpel to make a linear midline incision over the L3-4 and L4-5 intervertebral disc space. I then used electrocautery to perform a left sided subperiosteal dissection exposing the spinous process and lamina of L3-4 and L4-5. We obtained intraoperative radiograph to confirm our location. I then inserted the [facility] retractor for exposure.  We then brought the operative microscope into the field. Under its magnification and illumination we completed the microdissection. I used a high-speed drill to perform a laminotomy at L3-4 and L4-5 on the left. I then used a Kerrison punches to widen the laminotomy and removed the ligamentum flavum at L3-4 and L4-5 on the left. We then used microdissection to free up the thecal sac and the left L4 and L5 nerve root from the  epidural tissue. I then used a Kerrison punch to perform a foraminotomy at about the left L4 and L5 nerve root.  We then drilled across the midline and L3-4 and L4-5 and performed a right L3-4 and L4-5 laminotomies fract foraminotomies from the left side.  We performed foraminotomies about the right L4 and L5 nerve root.  We inspected the intervertebral disc at L3-4 and L4-5 bilaterally.  There were no herniations.   I then palpated along the ventral surface of the thecal sac and along exit route of the bilateral L4 and L5 nerve root and noted that the neural structures were well decompressed. This completed the decompression.  We then obtained hemostasis using bipolar electrocautery. We irrigated the wound out with saline solution. We then removed the retractor. We then reapproximated the patient's thoracolumbar fascia with interrupted #1 Vicryl suture. We then reapproximated the patient's subcutaneous tissue with interrupted 2-0 Vicryl suture. We then reapproximated patient's skin with Steri-Strips and benzoin. The was then coated with bacitracin ointment. The drapes were removed. The patient was subsequently returned to the supine position where they were extubated by the anesthesia team. The patient was then transported to the postanesthesia care unit in stable condition. All sponge instrument and needle counts were reportedly correct at the end of this case.

## 2023-05-08 NOTE — Transfer of Care (Signed)
Immediate Anesthesia Transfer of Care Note  Patient: Thomas Palmer.  Procedure(s) Performed: Lumbar three-four, Lumbar four-five LAMINOTOMY, FORAMINOTOMY (Bilateral: Spine Lumbar)  Patient Location: PACU  Anesthesia Type:General  Level of Consciousness: awake, drowsy, and patient cooperative  Airway & Oxygen Therapy: Patient Spontanous Breathing and Patient connected to nasal cannula oxygen  Post-op Assessment: Report given to RN and Post -op Vital signs reviewed and stable  Post vital signs: Reviewed and stable  Last Vitals:  Vitals Value Taken Time  BP    Temp    Pulse 73 05/08/23 0958  Resp    SpO2 99 % 05/08/23 0958  Vitals shown include unfiled device data.  Last Pain:  Vitals:   05/08/23 0608  TempSrc:   PainSc: 2          Complications: No notable events documented.

## 2023-05-09 ENCOUNTER — Encounter (HOSPITAL_COMMUNITY): Payer: Self-pay | Admitting: Neurosurgery

## 2023-05-09 NOTE — Anesthesia Postprocedure Evaluation (Signed)
Anesthesia Post Note  Patient: Thomas Palmer.  Procedure(s) Performed: Lumbar three-four, Lumbar four-five LAMINOTOMY, FORAMINOTOMY (Bilateral: Spine Lumbar)     Patient location during evaluation: PACU Anesthesia Type: General Level of consciousness: awake and alert Pain management: pain level controlled Vital Signs Assessment: post-procedure vital signs reviewed and stable Respiratory status: spontaneous breathing, nonlabored ventilation, respiratory function stable and patient connected to nasal cannula oxygen Cardiovascular status: blood pressure returned to baseline and stable Postop Assessment: no apparent nausea or vomiting Anesthetic complications: no   No notable events documented.  Last Vitals:  Vitals:   05/08/23 1120 05/08/23 1530  BP: (!) 152/78 130/71  Pulse: 67 75  Resp: 18 16  Temp: 36.5 C 36.7 C  SpO2: 98% 93%    Last Pain:  Vitals:   05/08/23 1600  TempSrc:   PainSc: 0-No pain                 Catharine Kettlewell S

## 2023-06-19 DIAGNOSIS — M1711 Unilateral primary osteoarthritis, right knee: Secondary | ICD-10-CM | POA: Diagnosis not present

## 2023-06-19 DIAGNOSIS — G8929 Other chronic pain: Secondary | ICD-10-CM | POA: Diagnosis not present

## 2023-06-19 DIAGNOSIS — M25561 Pain in right knee: Secondary | ICD-10-CM | POA: Diagnosis not present

## 2023-07-01 DIAGNOSIS — J449 Chronic obstructive pulmonary disease, unspecified: Secondary | ICD-10-CM | POA: Diagnosis not present

## 2023-07-03 DIAGNOSIS — H9193 Unspecified hearing loss, bilateral: Secondary | ICD-10-CM | POA: Diagnosis not present

## 2023-07-03 DIAGNOSIS — M48062 Spinal stenosis, lumbar region with neurogenic claudication: Secondary | ICD-10-CM | POA: Diagnosis not present

## 2023-07-03 DIAGNOSIS — Z0001 Encounter for general adult medical examination with abnormal findings: Secondary | ICD-10-CM | POA: Diagnosis not present

## 2023-07-03 DIAGNOSIS — R8290 Unspecified abnormal findings in urine: Secondary | ICD-10-CM | POA: Diagnosis not present

## 2023-07-03 DIAGNOSIS — Z0000 Encounter for general adult medical examination without abnormal findings: Secondary | ICD-10-CM | POA: Diagnosis not present

## 2023-07-03 DIAGNOSIS — E039 Hypothyroidism, unspecified: Secondary | ICD-10-CM | POA: Diagnosis not present

## 2023-07-03 DIAGNOSIS — R7989 Other specified abnormal findings of blood chemistry: Secondary | ICD-10-CM | POA: Diagnosis not present

## 2023-07-03 DIAGNOSIS — E291 Testicular hypofunction: Secondary | ICD-10-CM | POA: Diagnosis not present

## 2023-07-10 DIAGNOSIS — R7989 Other specified abnormal findings of blood chemistry: Secondary | ICD-10-CM | POA: Diagnosis not present

## 2023-07-10 DIAGNOSIS — R3129 Other microscopic hematuria: Secondary | ICD-10-CM | POA: Diagnosis not present

## 2023-07-10 DIAGNOSIS — M792 Neuralgia and neuritis, unspecified: Secondary | ICD-10-CM | POA: Diagnosis not present

## 2023-07-10 DIAGNOSIS — Z96643 Presence of artificial hip joint, bilateral: Secondary | ICD-10-CM | POA: Diagnosis not present

## 2023-07-10 DIAGNOSIS — E039 Hypothyroidism, unspecified: Secondary | ICD-10-CM | POA: Diagnosis not present

## 2023-07-10 DIAGNOSIS — Z23 Encounter for immunization: Secondary | ICD-10-CM | POA: Diagnosis not present

## 2023-07-24 ENCOUNTER — Inpatient Hospital Stay (HOSPITAL_BASED_OUTPATIENT_CLINIC_OR_DEPARTMENT_OTHER): Payer: Medicare HMO | Admitting: Oncology

## 2023-07-24 ENCOUNTER — Inpatient Hospital Stay: Payer: Medicare HMO

## 2023-07-24 ENCOUNTER — Encounter: Payer: Self-pay | Admitting: Oncology

## 2023-07-24 ENCOUNTER — Inpatient Hospital Stay: Payer: Medicare HMO | Attending: Oncology

## 2023-07-24 VITALS — BP 102/55 | HR 73

## 2023-07-24 VITALS — BP 102/62 | HR 77 | Temp 96.8°F | Resp 15 | Ht 71.0 in | Wt 198.8 lb

## 2023-07-24 DIAGNOSIS — Z87891 Personal history of nicotine dependence: Secondary | ICD-10-CM | POA: Diagnosis not present

## 2023-07-24 DIAGNOSIS — Z79890 Hormone replacement therapy (postmenopausal): Secondary | ICD-10-CM | POA: Diagnosis not present

## 2023-07-24 DIAGNOSIS — D751 Secondary polycythemia: Secondary | ICD-10-CM

## 2023-07-24 DIAGNOSIS — Z800 Family history of malignant neoplasm of digestive organs: Secondary | ICD-10-CM | POA: Diagnosis not present

## 2023-07-24 DIAGNOSIS — Z808 Family history of malignant neoplasm of other organs or systems: Secondary | ICD-10-CM | POA: Diagnosis not present

## 2023-07-24 LAB — CBC WITH DIFFERENTIAL/PLATELET
Abs Immature Granulocytes: 0.05 10*3/uL (ref 0.00–0.07)
Basophils Absolute: 0.1 10*3/uL (ref 0.0–0.1)
Basophils Relative: 1 %
Eosinophils Absolute: 0.1 10*3/uL (ref 0.0–0.5)
Eosinophils Relative: 1 %
HCT: 51.5 % (ref 39.0–52.0)
Hemoglobin: 16.3 g/dL (ref 13.0–17.0)
Immature Granulocytes: 1 %
Lymphocytes Relative: 11 %
Lymphs Abs: 0.8 10*3/uL (ref 0.7–4.0)
MCH: 27.9 pg (ref 26.0–34.0)
MCHC: 31.7 g/dL (ref 30.0–36.0)
MCV: 88 fL (ref 80.0–100.0)
Monocytes Absolute: 0.6 10*3/uL (ref 0.1–1.0)
Monocytes Relative: 8 %
Neutro Abs: 6.1 10*3/uL (ref 1.7–7.7)
Neutrophils Relative %: 78 %
Platelets: 153 10*3/uL (ref 150–400)
RBC: 5.85 MIL/uL — ABNORMAL HIGH (ref 4.22–5.81)
RDW: 13.8 % (ref 11.5–15.5)
WBC: 7.7 10*3/uL (ref 4.0–10.5)
nRBC: 0 % (ref 0.0–0.2)

## 2023-07-24 NOTE — Assessment & Plan Note (Addendum)
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 Labs reviewed and discussed with patient Hct is >50, recommend phlebotomy x 1

## 2023-07-24 NOTE — Progress Notes (Signed)
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 Labs reviewed and discussed with patient Hct is >50, recommend phlebotomy x 1  Orders Placed This Encounter  Procedures   CBC with Differential (Cancer Center Only)    Standing Status:   Future    Standing Expiration Date:   07/23/2024   CBC with Differential (Cancer Center Only)    Standing Status:   Future    Standing Expiration Date:   07/23/2024   Follow up  6 months Lab cbc + Phleb 12 months lab MD cbc + Phleb All questions were answered. The patient knows to call the clinic with any problems, questions or concerns.  Rickard Patience, MD, PhD [facility] Hematology Oncology 07/24/2023   CHIEF COMPLAINTS/REASON FOR VISIT:  Secondary erythrocytosis  HISTORY OF PRESENTING ILLNESS:  Thomas Palmer. is a 82 y.o. male who was seen in consultation at the request of Barbette Reichmann, MD for evaluation of polycytosis/erythrocytosis Reviewed recent labs done at the primary care provider's office. 12/14/2020, hemoglobin 19.4, hematocrit 59.2, MCV 94.7, WBC 5.6, Chronic Onset, intermittent, duration since 2020. No aggravating or alleviating factors.   Associated signs or symptoms: Denies weight loss, fever, chills, fatigue, night sweats.   Context:  Smoking history: Former smoker. Testosterone supplements: Patient is on testosterone cream. History of blood clots: Denies Daytime somnolence: Denies Family history of polycythemia: Denies  INTERVAL HISTORY Thomas Palmer. is a 82 y.o. male who has above history reviewed by me today presents for follow up visit for secondary erythrocytosis. Patient reports feeling well today He is on testosterone replacement therapy.  He has no new complaints.   Review of Systems   Constitutional:  Positive for fatigue. Negative for appetite change, chills, fever and unexpected weight change.  HENT:   Negative for hearing loss and voice change.   Eyes:  Negative for eye problems and icterus.  Respiratory:  Negative for chest tightness, cough and shortness of breath.   Cardiovascular:  Negative for chest pain and leg swelling.  Gastrointestinal:  Negative for abdominal distention and abdominal pain.  Endocrine: Negative for hot flashes.  Genitourinary:  Negative for difficulty urinating, dysuria and frequency.   Musculoskeletal:  Negative for arthralgias.  Skin:  Negative for itching and rash.  Neurological:  Negative for light-headedness and numbness.  Hematological:  Negative for adenopathy. Does not bruise/bleed easily.  Psychiatric/Behavioral:  Negative for confusion.     MEDICAL HISTORY:  Past Medical History:  Diagnosis Date   Arthritis    "joints in hips and fingers; shoulders too" (07/13/2013)   Chronic lower back pain    COPD (chronic obstructive pulmonary disease) (HCC)    GOLD stage 3   Depression    Erythrocytosis 01/01/2021   H/O hiatal hernia    Hyperlipidemia    Hypertension    Hypothyroidism    Pneumonia 08/2011    SURGICAL HISTORY: Past Surgical History:  Procedure Laterality Date   ACHILLES TENDON REPAIR Left 2021   APPENDECTOMY     BACK SURGERY     kyphoplasty   CARDIAC CATHETERIZATION  ~ 2009   COLONOSCOPY WITH PROPOFOL N/A 05/03/2019   Procedure: COLONOSCOPY WITH PROPOFOL;  Surgeon: Christena Deem, MD;  Location: [facility] ENDOSCOPY;  Service: Endoscopy;  Laterality: N/A;   FIXATION KYPHOPLASTY LUMBAR SPINE  ~ 2012   "L1" (07/13/2013)   JOINT  REPLACEMENT     LAPAROSCOPIC APPENDECTOMY N/A 02/22/2018   Procedure: APPENDECTOMY LAPAROSCOPIC;  Surgeon: Ancil Linsey, MD;  Location: [facility] ORS;  Service: General;  Laterality: N/A;   LUMBAR LAMINECTOMY/DECOMPRESSION MICRODISCECTOMY Bilateral 05/08/2023   Procedure: Lumbar three-four,  Lumbar four-five LAMINOTOMY, FORAMINOTOMY;  Surgeon: Tressie Stalker, MD;  Location: [facility] OR;  Service: Neurosurgery;  Laterality: Bilateral;   TOTAL HIP ARTHROPLASTY Left 07/12/2013   Procedure: TOTAL HIP ARTHROPLASTY;  Surgeon: Dannielle Huh, MD;  Location: [facility] OR;  Service: Orthopedics;  Laterality: Left;   TOTAL HIP ARTHROPLASTY Right 05/17/2002    SOCIAL HISTORY: Social History   Socioeconomic History   Marital status: Married    Spouse name: Not on file   Number of children: Not on file   Years of education: Not on file   Highest education level: Not on file  Occupational History   Not on file  Tobacco Use   Smoking status: Former    Types: Pipe    Quit date: 07/07/1989    Years since quitting: 34.0   Smokeless tobacco: Former    Types: Chew    Quit date: 10/28/1989  Substance and Sexual Activity   Alcohol use: Yes    Alcohol/week: 5.0 standard drinks of alcohol    Types: 5 Cans of beer per week   Drug use: No   Sexual activity: Yes  Other Topics Concern   Not on file  Social History Narrative   Not on file   Social Determinants of Health   Financial Resource Strain: Low Risk  (07/10/2023)   Received from [facility]   Overall Financial Resource Strain (CARDIA)    Difficulty of Paying Living Expenses: Not hard at all  Food Insecurity: No Food Insecurity (07/10/2023)   Received from [facility]   Hunger Vital Sign    Worried About Running Out of Food in the Last Year: Never true    Ran Out of Food in the Last Year: Never true  Transportation Needs: No Transportation Needs (07/10/2023)   Received from [facility] - Transportation    In the past 12 months, has lack of transportation kept you from medical appointments or from getting medications?: No    Lack of Transportation (Non-Medical): No  Physical Activity: Not on file  Stress: Not on file  Social Connections: Not on file  Intimate Partner Violence:  Not on file    FAMILY HISTORY: Family History  Problem Relation Age of Onset   Skin cancer Father    Stomach cancer Maternal Grandmother     ALLERGIES:  is allergic to iodine and contrast media [iodinated contrast media].  MEDICATIONS:  Current Outpatient Medications  Medication Sig Dispense Refill   albuterol (VENTOLIN HFA) 108 (90 Base) MCG/ACT inhaler Inhale 2 puffs into the lungs every 6 (six) hours as needed for shortness of breath or wheezing.     Ascorbic Acid (VITAMIN C PO) Take 1 tablet by mouth daily.     Cyanocobalamin (B-12 PO) Take 1 tablet by mouth daily.     finasteride (PROSCAR) 5 MG tablet Take 5 mg by mouth daily.     Fluticasone-Umeclidin-Vilant (TRELEGY ELLIPTA) 100-62.5-25 MCG/ACT AEPB Inhale 1 puff into the lungs daily.     levothyroxine (SYNTHROID) 50 MCG tablet Take 50 mcg by mouth daily before breakfast. Take with 200 mcg to equal 250 mcg daily     levothyroxine (SYNTHROID, LEVOTHROID) 200 MCG tablet Take 200 mcg by mouth daily before  breakfast. Take with 50 mcg to equal 250 mcg daily     MELATONIN PO Take 1 tablet by mouth at bedtime as needed (sleep).     Multiple Vitamins-Minerals (MULTIVITAMIN WITH MINERALS) tablet Take 1 tablet by mouth daily.     Multiple Vitamins-Minerals (PRESERVISION AREDS 2) CAPS Take 1 capsule by mouth 2 (two) times daily.     tadalafil (CIALIS) 20 MG tablet Take 20 mg by mouth daily as needed for erectile dysfunction.     tamsulosin (FLOMAX) 0.4 MG CAPS capsule Take 0.8 mg by mouth daily.     TESTOSTERONE COMPOUNDING KIT TD Place 1.5 mLs onto the skin daily. TESTOSTERONE 75MG /ML VERSATILE CR  1.5 ml = 6 clicks     cyclobenzaprine (FLEXERIL) 5 MG tablet Take 1 tablet (5 mg total) by mouth 3 (three) times daily as needed for muscle spasms. 30 tablet 1   docusate sodium (COLACE) 100 MG capsule Take 1 capsule (100 mg total) by mouth 2 (two) times daily. 30 capsule 0   oxyCODONE-acetaminophen (PERCOCET) 5-325 MG tablet Take 1 tablet by  mouth every 4 (four) hours as needed for severe pain. 30 tablet 0   No current facility-administered medications for this visit.     PHYSICAL EXAMINATION: ECOG PERFORMANCE STATUS: 1 - Symptomatic but completely ambulatory Vitals:   07/24/23 1335  BP: 102/62  Pulse: 77  Resp: 15  Temp: (!) 96.8 F (36 C)  SpO2: 94%   Filed Weights   07/24/23 1335  Weight: 198 lb 12.8 oz (90.2 kg)    Physical Exam Constitutional:      General: He is not in acute distress. HENT:     Head: Normocephalic and atraumatic.  Eyes:     General: No scleral icterus. Cardiovascular:     Rate and Rhythm: Normal rate and regular rhythm.  Pulmonary:     Effort: Pulmonary effort is normal. No respiratory distress.  Abdominal:     General: There is no distension.  Musculoskeletal:        General: No deformity. Normal range of motion.     Cervical back: Normal range of motion and neck supple.  Skin:    General: Skin is warm.     Findings: No erythema or rash.  Neurological:     Mental Status: He is alert and oriented to person, place, and time. Mental status is at baseline.  Psychiatric:        Mood and Affect: Mood normal.     RADIOGRAPHIC STUDIES: I have personally reviewed the radiological images as listed and agreed with the findings in the report. No results found.   LABORATORY DATA:  I have reviewed the data as listed    Latest Ref Rng & Units 07/24/2023   12:59 PM 04/25/2023   10:02 AM 07/25/2022    2:02 PM  CBC  WBC 4.0 - 10.5 K/uL 7.7  6.4  5.8   Hemoglobin 13.0 - 17.0 g/dL 30.8  65.7  84.6   Hematocrit 39.0 - 52.0 % 51.5  52.4  45.8   Platelets 150 - 400 K/uL 153  158  148

## 2023-07-24 NOTE — Patient Instructions (Signed)

## 2023-07-31 ENCOUNTER — Encounter: Payer: Self-pay | Admitting: Urology

## 2023-07-31 ENCOUNTER — Ambulatory Visit: Payer: Medicare HMO | Admitting: Urology

## 2023-07-31 VITALS — BP 124/74 | HR 67 | Ht 71.0 in | Wt 198.0 lb

## 2023-07-31 DIAGNOSIS — R310 Gross hematuria: Secondary | ICD-10-CM | POA: Diagnosis not present

## 2023-07-31 DIAGNOSIS — R3129 Other microscopic hematuria: Secondary | ICD-10-CM

## 2023-07-31 LAB — URINALYSIS, COMPLETE
Bilirubin, UA: NEGATIVE
Glucose, UA: NEGATIVE
Leukocytes,UA: NEGATIVE
Nitrite, UA: NEGATIVE
Specific Gravity, UA: 1.02 (ref 1.005–1.030)
Urobilinogen, Ur: 0.2 mg/dL (ref 0.2–1.0)
pH, UA: 6.5 (ref 5.0–7.5)

## 2023-07-31 LAB — MICROSCOPIC EXAMINATION: RBC, Urine: >30 /[HPF] — AB (ref 0–2)

## 2023-07-31 NOTE — Progress Notes (Signed)
I, Maysun Anabel Bene, acting as a scribe for [name], MD., have documented all relevant documentation on the behalf of [name], MD, as directed by [name], MD while in the presence of [name], MD.  07/31/2023 11:53 AM   Thomas Palmer. September 24, 1941 [id number]  Referring provider: Barbette Reichmann, MD [address] [facility] [place],  [place] [address]  Chief Complaint  Patient presents with   Establish Care   Hematuria    HPI: Thomas Dukes. is a 82 y.o. male referred for evaluation of microhematuria.  Visit with Dr. Marcello Fennel 07/03/23 with urinalysis showing 100 RBC/13 WBC; urine culture was ordered, which was negative.  Denies gross hematuria, but did had a episode of gross hematuria approximately 1 year ago.  On tamsulosin 0.8 mg daily and finasteride for BPH with urinary symptoms of a weak urinary stream, intermittent stream, nocturia x3, and occasional urge incontinence at night.  He was a pipe smoker for 35 years and quit in 1990.  PCP has continued to check his PSA and it was 1.20 in February 2024 (uncorrected for finasteride). Only previous urology visit was a vasectomy 40 years ago. He has hypogonadism and has been on testosterone replacement prescribed/monitored by PCP.   PMH: Past Medical History:  Diagnosis Date   Arthritis    "joints in hips and fingers; shoulders too" (07/13/2013)   Chronic lower back pain    COPD (chronic obstructive pulmonary disease) (HCC)    GOLD stage 3   Depression    Erythrocytosis 01/01/2021   H/O hiatal hernia    Hyperlipidemia    Hypertension    Hypothyroidism    Pneumonia 08/2011    Surgical History: Past Surgical History:  Procedure Laterality Date   ACHILLES TENDON REPAIR Left 2021   APPENDECTOMY     BACK SURGERY     kyphoplasty   CARDIAC CATHETERIZATION  ~ 2009   COLONOSCOPY WITH PROPOFOL N/A 05/03/2019   Procedure: COLONOSCOPY WITH PROPOFOL;   Surgeon: Christena Deem, MD;  Location: [facility] ENDOSCOPY;  Service: Endoscopy;  Laterality: N/A;   FIXATION KYPHOPLASTY LUMBAR SPINE  ~ 2012   "L1" (07/13/2013)   JOINT REPLACEMENT     LAPAROSCOPIC APPENDECTOMY N/A 02/22/2018   Procedure: APPENDECTOMY LAPAROSCOPIC;  Surgeon: Ancil Linsey, MD;  Location: [facility] ORS;  Service: General;  Laterality: N/A;   LUMBAR LAMINECTOMY/DECOMPRESSION MICRODISCECTOMY Bilateral 05/08/2023   Procedure: Lumbar three-four, Lumbar four-five LAMINOTOMY, FORAMINOTOMY;  Surgeon: Tressie Stalker, MD;  Location: [facility] OR;  Service: Neurosurgery;  Laterality: Bilateral;   TOTAL HIP ARTHROPLASTY Left 07/12/2013   Procedure: TOTAL HIP ARTHROPLASTY;  Surgeon: Dannielle Huh, MD;  Location: [facility] OR;  Service: Orthopedics;  Laterality: Left;   TOTAL HIP ARTHROPLASTY Right 05/17/2002    Home Medications:  Allergies as of 07/31/2023       Reactions   Iodine Nausea Only   Contrast Dye - causes severe nausea   Contrast Media [iodinated Contrast Media] Nausea And Vomiting        Medication List        Accurate as of July 31, 2023 11:53 AM. If you have any questions, ask your nurse or doctor.          STOP taking these medications    cyclobenzaprine 5 MG tablet Commonly known as: FLEXERIL Stopped by: [name]   docusate sodium 100 MG capsule Commonly known as: COLACE Stopped by: [name]   oxyCODONE-acetaminophen [phone]  MG tablet Commonly known as: Percocet Stopped by: [name]       TAKE these medications    albuterol 108 (90 Base) MCG/ACT inhaler Commonly known as: VENTOLIN HFA Inhale 2 puffs into the lungs every 6 (six) hours as needed for shortness of breath or wheezing.   B-12 PO Take 1 tablet by mouth daily.   finasteride 5 MG tablet Commonly known as: PROSCAR Take 5 mg by mouth daily.   levothyroxine 200 MCG tablet Commonly known as: SYNTHROID Take 200 mcg by mouth daily before breakfast. Take with 50 mcg to equal  250 mcg daily   levothyroxine 50 MCG tablet Commonly known as: SYNTHROID Take 50 mcg by mouth daily before breakfast. Take with 200 mcg to equal 250 mcg daily   MELATONIN PO Take 1 tablet by mouth at bedtime as needed (sleep).   PreserVision AREDS 2 Caps Take 1 capsule by mouth 2 (two) times daily.   multivitamin with minerals tablet Take 1 tablet by mouth daily.   tadalafil 20 MG tablet Commonly known as: CIALIS Take 20 mg by mouth daily as needed for erectile dysfunction.   tamsulosin 0.4 MG Caps capsule Commonly known as: FLOMAX Take 0.8 mg by mouth daily.   TESTOSTERONE COMPOUNDING KIT TD Place 1.5 mLs onto the skin daily. TESTOSTERONE 75MG /ML VERSATILE CR  1.5 ml = 6 clicks   Trelegy Ellipta 100-62.5-25 MCG/ACT Aepb Generic drug: Fluticasone-Umeclidin-Vilant Inhale 1 puff into the lungs daily.   VITAMIN C PO Take 1 tablet by mouth daily.        Allergies:  Allergies  Allergen Reactions   Iodine Nausea Only    Contrast Dye - causes severe nausea   Contrast Media [Iodinated Contrast Media] Nausea And Vomiting    Family History: Family History  Problem Relation Age of Onset   Skin cancer Father    Stomach cancer Maternal Grandmother     Social History:  reports that he quit smoking about 34 years ago. His smoking use included pipe. He quit smokeless tobacco use about 33 years ago.  His smokeless tobacco use included chew. He reports current alcohol use of about 5.0 standard drinks of alcohol per week. He reports that he does not use drugs.   Physical Exam: BP 124/74   Pulse 67   Ht 5\' 11"  (1.803 m)   Wt 198 lb (89.8 kg)   BMI 27.62 kg/m   Constitutional:  Alert, No acute distress. HEENT:  [place] AT Cardiovascular: No clubbing, cyanosis, or edema. Respiratory: Normal respiratory effort, no increased work of breathing. GU: Prostate 60+ cc, smooth without nodules. Psychiatric: Normal mood and affect.   Urinalysis Dipstick 2+ blood/trace ketone/1+  protein microscopy 11-30 WBC/>30 RBC   Assessment & Plan:    1. Microhematuria  AUA hematuria risk stratification: high  We discussed the differential diagnosis for microscopic hematuria.  Both benign and malignant etiologies were discussed  including nephrolithiasis, renal or upper tract tumors, bladder tumors, bladder stones, BPH, medical renal disease as well as undetermined etiologies. Per AUA guidelines, I did recommend a complete hematuria evaluation including CT urogram, possible urine cytology, and office cystoscopy.  All questions were answered and he desires to schedule.    I have reviewed the above documentation for accuracy and completeness, and I agree with the above.   [name], MD  [facility] [address], [address] [place], [place] [address] [phone]

## 2023-08-08 ENCOUNTER — Ambulatory Visit
Admission: RE | Admit: 2023-08-08 | Discharge: 2023-08-08 | Disposition: A | Discharge status: Home / Self Care | Payer: Medicare HMO | Source: Ambulatory Visit | Attending: Urology | Admitting: Urology

## 2023-08-08 DIAGNOSIS — K5730 Diverticulosis of large intestine without perforation or abscess without bleeding: Secondary | ICD-10-CM | POA: Diagnosis not present

## 2023-08-08 DIAGNOSIS — N281 Cyst of kidney, acquired: Secondary | ICD-10-CM | POA: Diagnosis not present

## 2023-08-08 DIAGNOSIS — R3129 Other microscopic hematuria: Secondary | ICD-10-CM | POA: Insufficient documentation

## 2023-08-08 DIAGNOSIS — K7689 Other specified diseases of liver: Secondary | ICD-10-CM | POA: Diagnosis not present

## 2023-08-08 DIAGNOSIS — R319 Hematuria, unspecified: Secondary | ICD-10-CM | POA: Diagnosis not present

## 2023-08-08 MED ORDER — IOHEXOL 300 MG/ML  SOLN
100.0000 mL | Freq: Once | INTRAMUSCULAR | Status: AC | PRN
  Administered 2023-08-08: 100 mL via INTRAVENOUS

## 2023-08-25 DIAGNOSIS — Z961 Presence of intraocular lens: Secondary | ICD-10-CM | POA: Diagnosis not present

## 2023-08-25 DIAGNOSIS — H353132 Nonexudative age-related macular degeneration, bilateral, intermediate dry stage: Secondary | ICD-10-CM | POA: Diagnosis not present

## 2023-08-25 DIAGNOSIS — H43813 Vitreous degeneration, bilateral: Secondary | ICD-10-CM | POA: Diagnosis not present

## 2023-09-04 ENCOUNTER — Encounter: Payer: Self-pay | Admitting: Urology

## 2023-09-04 ENCOUNTER — Ambulatory Visit: Payer: Medicare HMO | Admitting: Urology

## 2023-09-04 VITALS — BP 143/78 | HR 67 | Ht 71.0 in | Wt 195.0 lb

## 2023-09-04 DIAGNOSIS — D494 Neoplasm of unspecified behavior of bladder: Secondary | ICD-10-CM | POA: Diagnosis not present

## 2023-09-04 DIAGNOSIS — R3129 Other microscopic hematuria: Secondary | ICD-10-CM

## 2023-09-04 LAB — URINALYSIS, COMPLETE
Bilirubin, UA: NEGATIVE
Glucose, UA: NEGATIVE
Leukocytes,UA: NEGATIVE
Nitrite, UA: NEGATIVE
Specific Gravity, UA: 1.02 (ref 1.005–1.030)
Urobilinogen, Ur: 0.2 mg/dL (ref 0.2–1.0)
pH, UA: 6.5 (ref 5.0–7.5)

## 2023-09-04 LAB — MICROSCOPIC EXAMINATION: RBC, Urine: >30 /[HPF] — AB (ref 0–2)

## 2023-09-04 NOTE — H&P (View-Only) (Signed)
 09/04/2023 12:54 PM   [name]. 13-Jul-1941 [id number]  Referring provider: Barbette Reichmann, MD [address] [facility] [place],  [place] [address]  Chief Complaint  Patient presents with   Cysto    HPI: Thomas Palmer. is a 82 y.o. male with microhematuria found on cystoscopy today to have multifocal papillary tumors consistent with superficial urothelial carcinoma of the bladder.  TURBT has been recommended.   PMH: Past Medical History:  Diagnosis Date   Arthritis    "joints in hips and fingers; shoulders too" (07/13/2013)   Chronic lower back pain    COPD (chronic obstructive pulmonary disease) (HCC)    GOLD stage 3   Depression    Erythrocytosis 01/01/2021   H/O hiatal hernia    Hyperlipidemia    Hypertension    Hypothyroidism    Pneumonia 08/2011    Surgical History: Past Surgical History:  Procedure Laterality Date   ACHILLES TENDON REPAIR Left 2021   APPENDECTOMY     BACK SURGERY     kyphoplasty   CARDIAC CATHETERIZATION  ~ 2009   COLONOSCOPY WITH PROPOFOL N/A 05/03/2019   Procedure: COLONOSCOPY WITH PROPOFOL;  Surgeon: Christena Deem, MD;  Location: [facility] ENDOSCOPY;  Service: Endoscopy;  Laterality: N/A;   FIXATION KYPHOPLASTY LUMBAR SPINE  ~ 2012   "L1" (07/13/2013)   JOINT REPLACEMENT     LAPAROSCOPIC APPENDECTOMY N/A 02/22/2018   Procedure: APPENDECTOMY LAPAROSCOPIC;  Surgeon: Ancil Linsey, MD;  Location: [facility] ORS;  Service: General;  Laterality: N/A;   LUMBAR LAMINECTOMY/DECOMPRESSION MICRODISCECTOMY Bilateral 05/08/2023   Procedure: Lumbar three-four, Lumbar four-five LAMINOTOMY, FORAMINOTOMY;  Surgeon: Tressie Stalker, MD;  Location: [facility] OR;  Service: Neurosurgery;  Laterality: Bilateral;   TOTAL HIP ARTHROPLASTY Left 07/12/2013   Procedure: TOTAL HIP ARTHROPLASTY;  Surgeon: Dannielle Huh, MD;  Location: [facility] OR;  Service: Orthopedics;  Laterality: Left;   TOTAL HIP ARTHROPLASTY Right 05/17/2002     Home Medications:  Allergies as of 09/04/2023       Reactions   Iodine Nausea Only   Contrast Dye - causes severe nausea   Contrast Media [iodinated Contrast Media] Nausea And Vomiting        Medication List        Accurate as of September 04, 2023 12:54 PM. If you have any questions, ask your nurse or doctor.          STOP taking these medications    Trelegy Ellipta 100-62.5-25 MCG/ACT Aepb Generic drug: Fluticasone-Umeclidin-Vilant Stopped by: [name]       TAKE these medications    albuterol 108 (90 Base) MCG/ACT inhaler Commonly known as: VENTOLIN HFA Inhale 2 puffs into the lungs every 6 (six) hours as needed for shortness of breath or wheezing.   B-12 PO Take 1 tablet by mouth daily.   finasteride 5 MG tablet Commonly known as: PROSCAR Take 5 mg by mouth daily.   levothyroxine 200 MCG tablet Commonly known as: SYNTHROID Take 200 mcg by mouth daily before breakfast. Take with 50 mcg to equal 250 mcg daily   levothyroxine 50 MCG tablet Commonly known as: SYNTHROID Take 50 mcg by mouth daily before breakfast. Take with 200 mcg to equal 250 mcg daily   MELATONIN PO Take 1 tablet by mouth at bedtime as needed (sleep).   PreserVision AREDS 2 Caps Take 1 capsule by mouth 2 (two) times daily.   multivitamin with minerals tablet Take 1 tablet by mouth daily.   tadalafil  20 MG tablet Commonly known as: CIALIS Take 20 mg by mouth daily as needed for erectile dysfunction.   tamsulosin 0.4 MG Caps capsule Commonly known as: FLOMAX Take 0.8 mg by mouth daily.   TESTOSTERONE COMPOUNDING KIT TD Place 1.5 mLs onto the skin daily. TESTOSTERONE 75MG /ML VERSATILE CR  1.5 ml = 6 clicks   VITAMIN C PO Take 1 tablet by mouth daily.        Allergies:  Allergies  Allergen Reactions   Iodine Nausea Only    Contrast Dye - causes severe nausea   Contrast Media [Iodinated Contrast Media] Nausea And Vomiting    Family History: Family History   Problem Relation Age of Onset   Skin cancer Father    Stomach cancer Maternal Grandmother     Social History:  reports that he quit smoking about 34 years ago. His smoking use included pipe. He quit smokeless tobacco use about 33 years ago.  His smokeless tobacco use included chew. He reports current alcohol use of about 5.0 standard drinks of alcohol per week. He reports that he does not use drugs.   Physical Exam: BP (!) 143/78   Pulse 67   Ht 5\' 11"  (1.803 m)   Wt 195 lb (88.5 kg)   BMI 27.20 kg/m   Constitutional:  Alert and oriented, No acute distress. HEENT: [place] AT Respiratory: Normal respiratory effort, no increased work of breathing. Psychiatric: Normal mood and affect.  Laboratory Data:  Urinalysis Microscopy and >30 RBC   Pertinent Imaging: CT images were personally reviewed and interpreted  CT HEMATURIA WORKUP  Narrative CLINICAL DATA:  Hematuria  EXAM: CT ABDOMEN AND PELVIS WITHOUT AND WITH CONTRAST  TECHNIQUE: Multidetector CT imaging of the abdomen and pelvis was performed following the standard protocol before and following the bolus administration of intravenous contrast.  RADIATION DOSE REDUCTION: This exam was performed according to the departmental dose-optimization program which includes automated exposure control, adjustment of the mA and/or kV according to patient size and/or use of iterative reconstruction technique.  CONTRAST:  OMNIPAQUE IOHEXOL 300 MG/ML  SOLN  COMPARISON:  02/22/2018  FINDINGS: Lower chest: Lung bases are clear.  Hepatobiliary: 16 mm cyst in the right hepatic lobe (series 7/image 22).  Gallbladder is unremarkable. No intrahepatic or extrahepatic ductal dilatation.  Pancreas: Within normal limits.  Spleen: Within normal limits.  Adrenals/Urinary Tract: Adrenal glands are within normal limits.  12 mm lateral left upper pole renal cyst (series 3/image 43), measuring simple fluid density, benign (Bosniak  I). Additional subcentimeter cyst in the posterior right lower kidney (series 3/image 48), benign (Bosniak I). No follow-up is recommended.  No renal or ureteral calculi.  No hydronephrosis.  On delayed imaging, there are no filling defects in the bilateral opacified proximal collecting systems, ureters, or bladder.  Bladders partially obscured by streak artifact.  Stomach/Bowel: Stomach is within normal limits.  No evidence of bowel obstruction.  Suspected prior appendectomy.  Extensive sigmoid diverticulosis, without evidence of diverticulitis.  Vascular/Lymphatic: No evidence of abdominal aortic aneurysm.  Atherosclerotic calcifications of the abdominal aorta and branch vessels, although vessels remain patent.  No suspicious abdominopelvic lymphadenopathy.  Reproductive: Prostate is poorly visualized due to streak artifact, noting dystrophic calcifications.  Other: No abdominopelvic ascites.  Musculoskeletal: Degenerative changes of the visualized thoracolumbar spine. Prior vertebral augmentation at L1. Bilateral hip arthroplasties.  IMPRESSION: No CT findings to account for the patient's hematuria.  Extensive sigmoid diverticulosis, without evidence of diverticulitis.   Electronically Signed By: Charline Bills  M.D. On: 08/18/2023 00:45   Assessment & Plan:   Cystoscopy today with multifocal papillary tumors consistent with superficial urothelial carcinoma bladder Scheduled for TURBT which was discussed in detail as per today's cystoscopy note All questions were answered and he desires to proceed   [name], MD  [facility] [address], [address] [place], [place] [address] [phone]

## 2023-09-04 NOTE — Progress Notes (Signed)
09/04/2023 12:54 PM   [name]. 13-Jul-1941 [id number]  Referring provider: Barbette Reichmann, MD [address] [facility] [place],  [place] [address]  Chief Complaint  Patient presents with   Cysto    HPI: Thomas Palmer. is a 82 y.o. male with microhematuria found on cystoscopy today to have multifocal papillary tumors consistent with superficial urothelial carcinoma of the bladder.  TURBT has been recommended.   PMH: Past Medical History:  Diagnosis Date   Arthritis    "joints in hips and fingers; shoulders too" (07/13/2013)   Chronic lower back pain    COPD (chronic obstructive pulmonary disease) (HCC)    GOLD stage 3   Depression    Erythrocytosis 01/01/2021   H/O hiatal hernia    Hyperlipidemia    Hypertension    Hypothyroidism    Pneumonia 08/2011    Surgical History: Past Surgical History:  Procedure Laterality Date   ACHILLES TENDON REPAIR Left 2021   APPENDECTOMY     BACK SURGERY     kyphoplasty   CARDIAC CATHETERIZATION  ~ 2009   COLONOSCOPY WITH PROPOFOL N/A 05/03/2019   Procedure: COLONOSCOPY WITH PROPOFOL;  Surgeon: Christena Deem, MD;  Location: [facility] ENDOSCOPY;  Service: Endoscopy;  Laterality: N/A;   FIXATION KYPHOPLASTY LUMBAR SPINE  ~ 2012   "L1" (07/13/2013)   JOINT REPLACEMENT     LAPAROSCOPIC APPENDECTOMY N/A 02/22/2018   Procedure: APPENDECTOMY LAPAROSCOPIC;  Surgeon: Ancil Linsey, MD;  Location: [facility] ORS;  Service: General;  Laterality: N/A;   LUMBAR LAMINECTOMY/DECOMPRESSION MICRODISCECTOMY Bilateral 05/08/2023   Procedure: Lumbar three-four, Lumbar four-five LAMINOTOMY, FORAMINOTOMY;  Surgeon: Tressie Stalker, MD;  Location: [facility] OR;  Service: Neurosurgery;  Laterality: Bilateral;   TOTAL HIP ARTHROPLASTY Left 07/12/2013   Procedure: TOTAL HIP ARTHROPLASTY;  Surgeon: Dannielle Huh, MD;  Location: [facility] OR;  Service: Orthopedics;  Laterality: Left;   TOTAL HIP ARTHROPLASTY Right 05/17/2002     Home Medications:  Allergies as of 09/04/2023       Reactions   Iodine Nausea Only   Contrast Dye - causes severe nausea   Contrast Media [iodinated Contrast Media] Nausea And Vomiting        Medication List        Accurate as of September 04, 2023 12:54 PM. If you have any questions, ask your nurse or doctor.          STOP taking these medications    Trelegy Ellipta 100-62.5-25 MCG/ACT Aepb Generic drug: Fluticasone-Umeclidin-Vilant Stopped by: [name]       TAKE these medications    albuterol 108 (90 Base) MCG/ACT inhaler Commonly known as: VENTOLIN HFA Inhale 2 puffs into the lungs every 6 (six) hours as needed for shortness of breath or wheezing.   B-12 PO Take 1 tablet by mouth daily.   finasteride 5 MG tablet Commonly known as: PROSCAR Take 5 mg by mouth daily.   levothyroxine 200 MCG tablet Commonly known as: SYNTHROID Take 200 mcg by mouth daily before breakfast. Take with 50 mcg to equal 250 mcg daily   levothyroxine 50 MCG tablet Commonly known as: SYNTHROID Take 50 mcg by mouth daily before breakfast. Take with 200 mcg to equal 250 mcg daily   MELATONIN PO Take 1 tablet by mouth at bedtime as needed (sleep).   PreserVision AREDS 2 Caps Take 1 capsule by mouth 2 (two) times daily.   multivitamin with minerals tablet Take 1 tablet by mouth daily.   tadalafil  20 MG tablet Commonly known as: CIALIS Take 20 mg by mouth daily as needed for erectile dysfunction.   tamsulosin 0.4 MG Caps capsule Commonly known as: FLOMAX Take 0.8 mg by mouth daily.   TESTOSTERONE COMPOUNDING KIT TD Place 1.5 mLs onto the skin daily. TESTOSTERONE 75MG /ML VERSATILE CR  1.5 ml = 6 clicks   VITAMIN C PO Take 1 tablet by mouth daily.        Allergies:  Allergies  Allergen Reactions   Iodine Nausea Only    Contrast Dye - causes severe nausea   Contrast Media [Iodinated Contrast Media] Nausea And Vomiting    Family History: Family History   Problem Relation Age of Onset   Skin cancer Father    Stomach cancer Maternal Grandmother     Social History:  reports that he quit smoking about 34 years ago. His smoking use included pipe. He quit smokeless tobacco use about 33 years ago.  His smokeless tobacco use included chew. He reports current alcohol use of about 5.0 standard drinks of alcohol per week. He reports that he does not use drugs.   Physical Exam: BP (!) 143/78   Pulse 67   Ht 5\' 11"  (1.803 m)   Wt 195 lb (88.5 kg)   BMI 27.20 kg/m   Constitutional:  Alert and oriented, No acute distress. HEENT: [place] AT Respiratory: Normal respiratory effort, no increased work of breathing. Psychiatric: Normal mood and affect.  Laboratory Data:  Urinalysis Microscopy and >30 RBC   Pertinent Imaging: CT images were personally reviewed and interpreted  CT HEMATURIA WORKUP  Narrative CLINICAL DATA:  Hematuria  EXAM: CT ABDOMEN AND PELVIS WITHOUT AND WITH CONTRAST  TECHNIQUE: Multidetector CT imaging of the abdomen and pelvis was performed following the standard protocol before and following the bolus administration of intravenous contrast.  RADIATION DOSE REDUCTION: This exam was performed according to the departmental dose-optimization program which includes automated exposure control, adjustment of the mA and/or kV according to patient size and/or use of iterative reconstruction technique.  CONTRAST:  OMNIPAQUE IOHEXOL 300 MG/ML  SOLN  COMPARISON:  02/22/2018  FINDINGS: Lower chest: Lung bases are clear.  Hepatobiliary: 16 mm cyst in the right hepatic lobe (series 7/image 22).  Gallbladder is unremarkable. No intrahepatic or extrahepatic ductal dilatation.  Pancreas: Within normal limits.  Spleen: Within normal limits.  Adrenals/Urinary Tract: Adrenal glands are within normal limits.  12 mm lateral left upper pole renal cyst (series 3/image 43), measuring simple fluid density, benign (Bosniak  I). Additional subcentimeter cyst in the posterior right lower kidney (series 3/image 48), benign (Bosniak I). No follow-up is recommended.  No renal or ureteral calculi.  No hydronephrosis.  On delayed imaging, there are no filling defects in the bilateral opacified proximal collecting systems, ureters, or bladder.  Bladders partially obscured by streak artifact.  Stomach/Bowel: Stomach is within normal limits.  No evidence of bowel obstruction.  Suspected prior appendectomy.  Extensive sigmoid diverticulosis, without evidence of diverticulitis.  Vascular/Lymphatic: No evidence of abdominal aortic aneurysm.  Atherosclerotic calcifications of the abdominal aorta and branch vessels, although vessels remain patent.  No suspicious abdominopelvic lymphadenopathy.  Reproductive: Prostate is poorly visualized due to streak artifact, noting dystrophic calcifications.  Other: No abdominopelvic ascites.  Musculoskeletal: Degenerative changes of the visualized thoracolumbar spine. Prior vertebral augmentation at L1. Bilateral hip arthroplasties.  IMPRESSION: No CT findings to account for the patient's hematuria.  Extensive sigmoid diverticulosis, without evidence of diverticulitis.   Electronically Signed By: Charline Bills  M.D. On: 08/18/2023 00:45   Assessment & Plan:   Cystoscopy today with multifocal papillary tumors consistent with superficial urothelial carcinoma bladder Scheduled for TURBT which was discussed in detail as per today's cystoscopy note All questions were answered and he desires to proceed   [name], MD  [facility] [address], [address] [place], [place] [address] [phone]

## 2023-09-04 NOTE — Progress Notes (Signed)
   09/04/23  CC:  Chief Complaint  Patient presents with   Cysto    HPI: Seen 07/31/2023 for microhematuria.  CTU performed 08/08/2023 showed benign renal cysts. UA today >30 RBC  Blood pressure (!) 143/78, pulse 67, height 5\' 11"  (1.803 m), weight 195 lb (88.5 kg). NED. A&Ox3.   No respiratory distress   Abd soft, NT, ND Normal phallus with bilateral descended testicles  Cystoscopy Procedure Note  Patient identification was confirmed, informed consent was obtained, and patient was prepped using Betadine solution.  Lidocaine jelly was administered per urethral meatus.     Pre-Procedure: - Inspection reveals a normal caliber urethral meatus.  Procedure: The flexible cystoscope was introduced without difficulty - No urethral strictures/lesions are present. -Prominent lateral lobe enlargement with hypervascularity prostate  - Elevated bladder neck - Bilateral ureteral orifices not identified - Bladder mucosa  reveals multifocal papillary tumors bladder neck, base and posterior wall - No bladder stones -Moderate trabeculation with cellules  Retroflexion shows papillary tumors as above   Post-Procedure: - Patient tolerated the procedure well  Assessment/ Plan: Multifocal papillary tumors consistent with superficial urothelial carcinoma the bladder Findings were discussed including the likelihood of urothelial carcinoma Schedule TURBT.  The procedure was discussed in detail including potential risks of bleeding, infection and bladder injury.  All questions were answered and he desires to proceed Will plan post resection intravesical gemcitabine    [name], MD

## 2023-09-05 ENCOUNTER — Other Ambulatory Visit: Payer: Self-pay

## 2023-09-05 ENCOUNTER — Telehealth: Payer: Self-pay

## 2023-09-05 DIAGNOSIS — D494 Neoplasm of unspecified behavior of bladder: Secondary | ICD-10-CM

## 2023-09-05 DIAGNOSIS — R3129 Other microscopic hematuria: Secondary | ICD-10-CM

## 2023-09-05 NOTE — Progress Notes (Signed)
   [facility] Surgical Posting Form  Surgery Date: Date: 09/16/2023  Surgeon: Dr. Irineo Axon, MD  Inpt ( No  )   Outpt (Yes)   Obs ( No  )   Diagnosis: D49.4 Bladder Tumor  -CPT: 60454, [phone]  Surgery: Transurethral Resection of Bladder Tumor with Intravesical Instillation of Gemcitabine  Stop Anticoagulations: N/A  Cardiac/Medical/Pulmonary Clearance needed: no  *Orders entered into EPIC  Date: 09/05/23   *Case booked in EPIC  Date: 09/05/23  *Notified pt of Surgery: Date: 09/05/23  PRE-OP UA & CX: yes, will obtain in clinic on 09/08/2023  *Placed into Prior Authorization Work Angela Nevin Date: 09/05/23  Assistant/laser/rep:No

## 2023-09-05 NOTE — Progress Notes (Signed)
Surgical Physician Order Form [facility]  Dr. Irineo Axon, MD  * Scheduling expectation : Next Available  *Length of Case: 60 min  *Clearance needed: per Judie Grieve  *Anticoagulation Instructions: N/A  *Aspirin Instructions: N/A  *Post-op visit Date/Instructions:  1-2 week with pathology review  *Diagnosis: Multi-focal Bladder Tumors  *Procedure:  TURBT 2-5cm (42595)   Additional orders: Gemcitabine 2000mg  bladder instillation  -Admit type: OUTpatient  -Anesthesia: Choice  -VTE Prophylaxis Standing Order SCD's       Other:   -Standing Lab Orders Per Anesthesia    Lab other: UA&Urine Culture  -Standing Test orders EKG/Chest x-ray per Anesthesia       Test other:   - Medications:  Ancef 2gm IV  -Other orders:  N/A

## 2023-09-05 NOTE — Telephone Encounter (Signed)
Per Dr. Lonna Cobb, Patient is to be scheduled for Transurethral Resection of Bladder Tumor with Intravesical Instillation of Gemcitabine   Mr. Blakeman was contacted and possible surgical dates were discussed, Tuesday November 19th, 2024 was agreed upon for surgery.   Patient was instructed that Dr. Lonna Cobb will require them to provide a pre-op UA & CX prior to surgery. This was ordered and scheduled drop off appointment was made for 09/08/2023.    Patient was directed to call [phone] between 1-3pm the day before surgery to find out surgical arrival time.  Instructions were given not to eat or drink from midnight on the night before surgery and have a driver for the day of surgery. On the surgery day patient was instructed to enter through the Medical Mall entrance of [facility] report the Same Day Surgery desk.   Pre-Admit Testing will be in contact via phone to set up an interview with the anesthesia team to review your history and medications prior to surgery.   Reminder of this information was sent via MyChart to the patient.

## 2023-09-08 ENCOUNTER — Other Ambulatory Visit: Payer: Medicare HMO

## 2023-09-08 DIAGNOSIS — D494 Neoplasm of unspecified behavior of bladder: Secondary | ICD-10-CM

## 2023-09-08 LAB — MICROSCOPIC EXAMINATION: RBC, Urine: >30 /[HPF] — AB (ref 0–2)

## 2023-09-08 LAB — URINALYSIS, COMPLETE
Bilirubin, UA: NEGATIVE
Glucose, UA: NEGATIVE
Nitrite, UA: NEGATIVE
Specific Gravity, UA: 1.02 (ref 1.005–1.030)
Urobilinogen, Ur: 0.2 mg/dL (ref 0.2–1.0)
pH, UA: 6.5 (ref 5.0–7.5)

## 2023-09-09 DIAGNOSIS — Z9889 Other specified postprocedural states: Secondary | ICD-10-CM | POA: Diagnosis not present

## 2023-09-09 DIAGNOSIS — M48061 Spinal stenosis, lumbar region without neurogenic claudication: Secondary | ICD-10-CM | POA: Diagnosis not present

## 2023-09-10 ENCOUNTER — Other Ambulatory Visit: Payer: Self-pay

## 2023-09-10 ENCOUNTER — Encounter
Admission: RE | Admit: 2023-09-10 | Discharge: 2023-09-10 | Disposition: A | Discharge status: Home / Self Care | Payer: Medicare HMO | Source: Ambulatory Visit | Attending: Urology | Admitting: Urology

## 2023-09-10 HISTORY — DX: Spinal stenosis, lumbar region with neurogenic claudication: M48.062

## 2023-09-10 HISTORY — DX: Diverticulosis of large intestine without perforation or abscess without bleeding: K57.30

## 2023-09-10 HISTORY — DX: Unspecified acute appendicitis: K35.80

## 2023-09-10 HISTORY — DX: Age-related osteoporosis without current pathological fracture: M81.0

## 2023-09-10 HISTORY — DX: Iron deficiency anemia, unspecified: D50.9

## 2023-09-10 HISTORY — DX: Gastric ulcer, unspecified as acute or chronic, without hemorrhage or perforation: K25.9

## 2023-09-10 NOTE — Patient Instructions (Addendum)
Your procedure is scheduled on: Tuesday November 17  Report to the Registration Desk on the 1st floor of the [facility] To find out your arrival time, please call [phone] between 1PM - 3PM on:  Monday November 18  If your arrival time is 6:00 am, do not arrive before that time as the Medical Mall entrance doors do not open until 6:00 am.  REMEMBER: Instructions that are not followed completely may result in serious medical risk, up to and including death; or upon the discretion of your surgeon and anesthesiologist your surgery may need to be rescheduled.  Do not eat food after midnight the night before surgery.  No gum chewing or hard candies.    One week prior to surgery: Tuesday November 12  Stop Anti-inflammatories (NSAIDS) such as Advil, Aleve, Ibuprofen, Motrin, Naproxen, Naprosyn and Aspirin based products such as Excedrin, Goody's Powder, BC Powder. Stop ANY OVER THE COUNTER supplements until after surgery.  You may however, continue to take Tylenol if needed for pain up until the day of surgery.  Continue taking all of your other prescription medications up until the day of surgery. tadalafil (CIALIS) hold 2 days prior to surgery last dose Saturday November 16    ON THE DAY OF SURGERY ONLY TAKE THESE MEDICATIONS WITH SIPS OF WATER:  levothyroxine (SYNTHROID)   Use inhalers on the day of surgery and bring to the hospital. Fluticasone-Umeclidin-Vilant (TRELEGY ELLIPTA)  albuterol (VENTOLIN HFA)   No Alcohol for 24 hours before or after surgery.  No Smoking including e-cigarettes for 24 hours before surgery.  No chewable tobacco products for at least 6 hours before surgery.  No nicotine patches on the day of surgery.  Do not use any "recreational" drugs for at least a week (preferably 2 weeks) before your surgery.  Please be advised that the combination of cocaine and anesthesia may have negative outcomes, up to and including death. If you test positive for  cocaine, your surgery will be cancelled.  On the morning of surgery brush your teeth with toothpaste and water, you may rinse your mouth with mouthwash if you wish. Do not swallow any toothpaste or mouthwash.  Do not wear jewelry, make-up, hairpins, clips or nail polish.  For welded (permanent) jewelry: bracelets, anklets, waist bands, etc.  Please have this removed prior to surgery.  If it is not removed, there is a chance that hospital personnel will need to cut it off on the day of surgery.  Do not wear lotions, powders, or perfumes.   Do not shave body hair from the neck down 48 hours before surgery.  Contact lenses, hearing aids and dentures may not be worn into surgery.  Do not bring valuables to the hospital. [facility] is not responsible for any missing/lost belongings or valuables.   Notify your doctor if there is any change in your medical condition (cold, fever, infection).  Wear comfortable clothing (specific to your surgery type) to the hospital. PREFERABLY SOFT AND BIG PANTS AND NO BLUE JEANS  After surgery, you can help prevent lung complications by doing breathing exercises.  Take deep breaths and cough every 1-2 hours. Your doctor may order a device called an Incentive Spirometer to help you take deep breaths.  If you are being discharged the day of surgery, you will not be allowed to drive home. You will need a responsible individual to drive you home and stay with you for 24 hours after surgery.   If you are taking public transportation,  you will need to have a responsible individual with you.  Please call the Pre-admissions Testing Dept. at [phone] if you have any questions about these instructions.  Surgery Visitation Policy:  Patients having surgery or a procedure may have two visitors.  Children under the age of 25 must have an adult with them who is not the patient.

## 2023-09-11 LAB — CULTURE, URINE COMPREHENSIVE

## 2023-09-15 MED ORDER — GEMCITABINE CHEMO FOR BLADDER INSTILLATION 2000 MG
2000.0000 mg | Freq: Once | INTRAVENOUS | Status: DC
  Filled 2023-09-15: qty 52.6

## 2023-09-15 MED ORDER — CEFAZOLIN SODIUM-DEXTROSE 2-4 GM/100ML-% IV SOLN
2.0000 g | INTRAVENOUS | Status: AC
Start: 2023-09-15 — End: 2023-09-16
  Administered 2023-09-16: 2 g via INTRAVENOUS

## 2023-09-15 MED ORDER — LACTATED RINGERS IV SOLN: INTRAVENOUS | Status: DC

## 2023-09-15 MED ORDER — ORAL CARE MOUTH RINSE: 15.0000 mL | Freq: Once | OROMUCOSAL | Status: AC

## 2023-09-15 MED ORDER — CHLORHEXIDINE GLUCONATE 0.12 % MT SOLN
15.0000 mL | Freq: Once | OROMUCOSAL | Status: AC
  Administered 2023-09-16: 15 mL via OROMUCOSAL

## 2023-09-16 ENCOUNTER — Encounter: Payer: Self-pay | Admitting: Urology

## 2023-09-16 ENCOUNTER — Ambulatory Visit: Payer: Medicare HMO | Admitting: Anesthesiology

## 2023-09-16 ENCOUNTER — Other Ambulatory Visit: Payer: Self-pay

## 2023-09-16 ENCOUNTER — Encounter
Admission: RE | Disposition: A | Discharge status: Home / Self Care | Payer: Self-pay | Source: Home / Self Care | Attending: Urology

## 2023-09-16 ENCOUNTER — Ambulatory Visit
Admission: RE | Admit: 2023-09-16 | Discharge: 2023-09-16 | Disposition: A | Discharge status: Home / Self Care | Payer: Medicare HMO | Attending: Urology | Admitting: Urology

## 2023-09-16 DIAGNOSIS — C674 Malignant neoplasm of posterior wall of bladder: Secondary | ICD-10-CM | POA: Insufficient documentation

## 2023-09-16 DIAGNOSIS — G8929 Other chronic pain: Secondary | ICD-10-CM | POA: Diagnosis not present

## 2023-09-16 DIAGNOSIS — Z79890 Hormone replacement therapy (postmenopausal): Secondary | ICD-10-CM | POA: Diagnosis not present

## 2023-09-16 DIAGNOSIS — K5730 Diverticulosis of large intestine without perforation or abscess without bleeding: Secondary | ICD-10-CM | POA: Diagnosis not present

## 2023-09-16 DIAGNOSIS — E785 Hyperlipidemia, unspecified: Secondary | ICD-10-CM | POA: Diagnosis not present

## 2023-09-16 DIAGNOSIS — C675 Malignant neoplasm of bladder neck: Secondary | ICD-10-CM | POA: Insufficient documentation

## 2023-09-16 DIAGNOSIS — D494 Neoplasm of unspecified behavior of bladder: Secondary | ICD-10-CM | POA: Diagnosis present

## 2023-09-16 DIAGNOSIS — D414 Neoplasm of uncertain behavior of bladder: Secondary | ICD-10-CM | POA: Diagnosis not present

## 2023-09-16 DIAGNOSIS — D509 Iron deficiency anemia, unspecified: Secondary | ICD-10-CM | POA: Insufficient documentation

## 2023-09-16 DIAGNOSIS — M810 Age-related osteoporosis without current pathological fracture: Secondary | ICD-10-CM | POA: Diagnosis not present

## 2023-09-16 DIAGNOSIS — F32A Depression, unspecified: Secondary | ICD-10-CM | POA: Diagnosis not present

## 2023-09-16 DIAGNOSIS — R3129 Other microscopic hematuria: Secondary | ICD-10-CM | POA: Insufficient documentation

## 2023-09-16 DIAGNOSIS — I10 Essential (primary) hypertension: Secondary | ICD-10-CM | POA: Diagnosis not present

## 2023-09-16 DIAGNOSIS — C678 Malignant neoplasm of overlapping sites of bladder: Secondary | ICD-10-CM | POA: Diagnosis not present

## 2023-09-16 DIAGNOSIS — K219 Gastro-esophageal reflux disease without esophagitis: Secondary | ICD-10-CM | POA: Insufficient documentation

## 2023-09-16 DIAGNOSIS — Z87891 Personal history of nicotine dependence: Secondary | ICD-10-CM | POA: Insufficient documentation

## 2023-09-16 DIAGNOSIS — E039 Hypothyroidism, unspecified: Secondary | ICD-10-CM | POA: Insufficient documentation

## 2023-09-16 DIAGNOSIS — J449 Chronic obstructive pulmonary disease, unspecified: Secondary | ICD-10-CM | POA: Insufficient documentation

## 2023-09-16 HISTORY — PX: BLADDER INSTILLATION: SHX6893

## 2023-09-16 HISTORY — PX: TRANSURETHRAL RESECTION OF BLADDER TUMOR: SHX2575

## 2023-09-16 SURGERY — TURBT (TRANSURETHRAL RESECTION OF BLADDER TUMOR)
Anesthesia: General

## 2023-09-16 MED ORDER — PHENYLEPHRINE HCL-NACL 20-0.9 MG/250ML-% IV SOLN
INTRAVENOUS | Status: AC
  Filled 2023-09-16: qty 250

## 2023-09-16 MED ORDER — ACETAMINOPHEN 10 MG/ML IV SOLN
INTRAVENOUS | Status: AC
  Filled 2023-09-16: qty 100

## 2023-09-16 MED ORDER — PHENYLEPHRINE HCL (PRESSORS) 10 MG/ML IV SOLN
INTRAVENOUS | Status: AC
  Filled 2023-09-16: qty 1

## 2023-09-16 MED ORDER — FENTANYL CITRATE (PF) 100 MCG/2ML IJ SOLN
25.0000 ug | INTRAMUSCULAR | Status: DC | PRN
Start: 2023-09-16 — End: 2023-09-16
  Administered 2023-09-16: 50 ug via INTRAVENOUS
  Administered 2023-09-16 (×2): 25 ug via INTRAVENOUS

## 2023-09-16 MED ORDER — SODIUM CHLORIDE 0.9 % IR SOLN
Status: DC | PRN
  Administered 2023-09-16: 12000 mL via INTRAVESICAL

## 2023-09-16 MED ORDER — FENTANYL CITRATE (PF) 100 MCG/2ML IJ SOLN
INTRAMUSCULAR | Status: AC
  Filled 2023-09-16: qty 2

## 2023-09-16 MED ORDER — GEMCITABINE CHEMO FOR BLADDER INSTILLATION 2000 MG
INTRAVENOUS | Status: DC | PRN
  Administered 2023-09-16: 2000 mg via INTRAVESICAL

## 2023-09-16 MED ORDER — PHENAZOPYRIDINE HCL 100 MG PO TABS: 100.0000 mg | ORAL_TABLET | Freq: Three times a day (TID) | ORAL | 0 refills | Status: DC | PRN

## 2023-09-16 MED ORDER — LIDOCAINE HCL (CARDIAC) PF 100 MG/5ML IV SOSY
PREFILLED_SYRINGE | INTRAVENOUS | Status: DC | PRN
  Administered 2023-09-16: 100 mg via INTRAVENOUS

## 2023-09-16 MED ORDER — PROPOFOL 10 MG/ML IV BOLUS
INTRAVENOUS | Status: DC | PRN
  Administered 2023-09-16: 100 mg via INTRAVENOUS

## 2023-09-16 MED ORDER — OXYCODONE HCL 5 MG PO TABS: 5.0000 mg | ORAL_TABLET | Freq: Once | ORAL | Status: DC | PRN

## 2023-09-16 MED ORDER — ROCURONIUM BROMIDE 100 MG/10ML IV SOLN
INTRAVENOUS | Status: DC | PRN
  Administered 2023-09-16: 50 mg via INTRAVENOUS
  Administered 2023-09-16: 10 mg via INTRAVENOUS

## 2023-09-16 MED ORDER — SUCCINYLCHOLINE CHLORIDE 200 MG/10ML IV SOSY
PREFILLED_SYRINGE | INTRAVENOUS | Status: DC | PRN
  Administered 2023-09-16: 100 mg via INTRAVENOUS

## 2023-09-16 MED ORDER — GLYCOPYRROLATE 0.2 MG/ML IJ SOLN
INTRAMUSCULAR | Status: DC | PRN
  Administered 2023-09-16: .2 mg via INTRAVENOUS

## 2023-09-16 MED ORDER — PHENYLEPHRINE HCL-NACL 20-0.9 MG/250ML-% IV SOLN
INTRAVENOUS | Status: DC | PRN
  Administered 2023-09-16: 25 ug/min via INTRAVENOUS

## 2023-09-16 MED ORDER — ONDANSETRON HCL 4 MG/2ML IJ SOLN
INTRAMUSCULAR | Status: DC | PRN
  Administered 2023-09-16 (×2): 4 mg via INTRAVENOUS

## 2023-09-16 MED ORDER — FENTANYL CITRATE (PF) 100 MCG/2ML IJ SOLN
INTRAMUSCULAR | Status: DC | PRN
  Administered 2023-09-16 (×2): 50 ug via INTRAVENOUS

## 2023-09-16 MED ORDER — PHENYLEPHRINE 80 MCG/ML (10ML) SYRINGE FOR IV PUSH (FOR BLOOD PRESSURE SUPPORT)
PREFILLED_SYRINGE | INTRAVENOUS | Status: DC | PRN
  Administered 2023-09-16: 160 ug via INTRAVENOUS
  Administered 2023-09-16: 80 ug via INTRAVENOUS

## 2023-09-16 MED ORDER — OXYCODONE HCL 5 MG/5ML PO SOLN: 5.0000 mg | Freq: Once | ORAL | Status: DC | PRN

## 2023-09-16 MED ORDER — DEXAMETHASONE SODIUM PHOSPHATE 10 MG/ML IJ SOLN
INTRAMUSCULAR | Status: DC | PRN
  Administered 2023-09-16: 10 mg via INTRAVENOUS

## 2023-09-16 MED ORDER — EPHEDRINE SULFATE-NACL 50-0.9 MG/10ML-% IV SOSY
PREFILLED_SYRINGE | INTRAVENOUS | Status: DC | PRN
  Administered 2023-09-16: 5 mg via INTRAVENOUS

## 2023-09-16 MED ORDER — CEFAZOLIN SODIUM-DEXTROSE 2-4 GM/100ML-% IV SOLN
INTRAVENOUS | Status: AC
  Filled 2023-09-16: qty 100

## 2023-09-16 MED ORDER — ACETAMINOPHEN 10 MG/ML IV SOLN
INTRAVENOUS | Status: DC | PRN
  Administered 2023-09-16: 1000 mg via INTRAVENOUS

## 2023-09-16 MED ORDER — SUGAMMADEX SODIUM 200 MG/2ML IV SOLN
INTRAVENOUS | Status: DC | PRN
  Administered 2023-09-16: 200 mg via INTRAVENOUS

## 2023-09-16 MED ORDER — CHLORHEXIDINE GLUCONATE 0.12 % MT SOLN
OROMUCOSAL | Status: AC
Start: 2023-09-16 — End: ?
  Filled 2023-09-16: qty 15

## 2023-09-16 SURGICAL SUPPLY — 23 items
BAG DRAIN SIEMENS DORNER NS (MISCELLANEOUS) ×1 IMPLANT
BAG URINE DRAIN 2000ML AR STRL (UROLOGICAL SUPPLIES) ×1 IMPLANT
CATH FOLEY 2WAY 18X30 (CATHETERS) IMPLANT
CATH FOLEY 2WAY SIL 18X30 (CATHETERS) ×1
DRAPE UTILITY 15X26 TOWEL STRL (DRAPES) ×1 IMPLANT
DRSG TELFA 3X4 N-ADH STERILE (GAUZE/BANDAGES/DRESSINGS) ×1 IMPLANT
ELECT LOOP 22F BIPOLAR SML (ELECTROSURGICAL)
ELECT REM PT RETURN 9FT ADLT (ELECTROSURGICAL)
ELECTRODE LOOP 22F BIPOLAR SML (ELECTROSURGICAL) IMPLANT
ELECTRODE REM PT RTRN 9FT ADLT (ELECTROSURGICAL) IMPLANT
GLOVE BIOGEL PI IND STRL 7.5 (GLOVE) ×1 IMPLANT
GOWN STRL REUS W/ TWL LRG LVL3 (GOWN DISPOSABLE) ×1 IMPLANT
GOWN STRL REUS W/TWL LRG LVL3 (GOWN DISPOSABLE) ×1
GOWN STRL REUS W/TWL XL LVL4 (GOWN DISPOSABLE) ×1 IMPLANT
IV NS IRRIG 3000ML ARTHROMATIC (IV SOLUTION) ×2 IMPLANT
KIT TURNOVER CYSTO (KITS) ×1 IMPLANT
LOOP CUT BIPOLAR 24F LRG (ELECTROSURGICAL) IMPLANT
PACK CYSTO AR (MISCELLANEOUS) ×1 IMPLANT
SET IRRIG Y TYPE TUR BLADDER L (SET/KITS/TRAYS/PACK) ×1 IMPLANT
SURGILUBE 2OZ TUBE FLIPTOP (MISCELLANEOUS) ×1 IMPLANT
SYR TOOMEY IRRIG 70ML (MISCELLANEOUS) ×1
SYRINGE TOOMEY IRRIG 70ML (MISCELLANEOUS) ×1 IMPLANT
WATER STERILE IRR 500ML POUR (IV SOLUTION) ×1 IMPLANT

## 2023-09-16 NOTE — Anesthesia Preprocedure Evaluation (Signed)
Anesthesia Evaluation  Patient identified by MRN, date of birth, ID band Patient awake    Reviewed: Allergy & Precautions, NPO status , Patient's Chart, lab work & pertinent test results  History of Anesthesia Complications Negative for: history of anesthetic complications  Airway Mallampati: III  TM Distance: <3 FB Neck ROM: full    Dental  (+) Chipped, Implants   Pulmonary shortness of breath and with exertion, COPD, former smoker   Pulmonary exam normal        Cardiovascular Exercise Tolerance: Good hypertension, (-) angina (-) Past MI and (-) DOE Normal cardiovascular exam     Neuro/Psych  PSYCHIATRIC DISORDERS      negative neurological ROS     GI/Hepatic Neg liver ROS, PUD,GERD  Controlled,,  Endo/Other  Hypothyroidism    Renal/GU      Musculoskeletal   Abdominal   Peds  Hematology negative hematology ROS (+)   Anesthesia Other Findings Past Medical History: No date: Acute appendicitis No date: Arthritis     Comment:  "joints in hips and fingers; shoulders too" (07/13/2013) No date: Chronic lower back pain No date: COPD (chronic obstructive pulmonary disease) (HCC)     Comment:  GOLD stage 3 No date: Depression No date: Diverticulosis of large intestine without hemorrhage 01/01/2021: Erythrocytosis No date: Gastric ulcer No date: H/O hiatal hernia No date: Hyperlipidemia No date: Hypertension No date: Hypothyroidism No date: IDA (iron deficiency anemia) No date: OP (osteoporosis) 08/2011: Pneumonia No date: Spinal stenosis of lumbar region with neurogenic claudication  Past Surgical History: 2021: ACHILLES TENDON REPAIR; Left No date: APPENDECTOMY No date: BACK SURGERY     Comment:  kyphoplasty ~ 2009: CARDIAC CATHETERIZATION 05/03/2019: COLONOSCOPY WITH PROPOFOL; N/A     Comment:  Procedure: COLONOSCOPY WITH PROPOFOL;  Surgeon:               Christena Deem, MD;  Location: [facility];                 Service: Endoscopy;  Laterality: N/A; ~ 2012: FIXATION KYPHOPLASTY LUMBAR SPINE     Comment:  "L1" (07/13/2013) No date: JOINT REPLACEMENT 02/22/2018: LAPAROSCOPIC APPENDECTOMY; N/A     Comment:  Procedure: APPENDECTOMY LAPAROSCOPIC;  Surgeon: Ancil Linsey, MD;  Location: [facility] ORS;  Service: General;                Laterality: N/A; 05/08/2023: LUMBAR LAMINECTOMY/DECOMPRESSION MICRODISCECTOMY; Bilateral     Comment:  Procedure: Lumbar three-four, Lumbar four-five               LAMINOTOMY, FORAMINOTOMY;  Surgeon: Tressie Stalker, MD;              Location: [facility] OR;  Service: Neurosurgery;  Laterality:               Bilateral; 07/12/2013: TOTAL HIP ARTHROPLASTY; Left     Comment:  Procedure: TOTAL HIP ARTHROPLASTY;  Surgeon: Dannielle Huh, MD;  Location: [facility] OR;  Service: Orthopedics;                Laterality: Left; 05/17/2002: TOTAL HIP ARTHROPLASTY; Right  BMI    Body Mass Index: 27.20 kg/m      Reproductive/Obstetrics negative OB ROS  Anesthesia Physical Anesthesia Plan  ASA: 3  Anesthesia Plan: General ETT   Post-op Pain Management:    Induction: Intravenous  PONV Risk Score and Plan: Ondansetron, Dexamethasone, Midazolam and Treatment may vary due to age or medical condition  Airway Management Planned: Oral ETT  Additional Equipment:   Intra-op Plan:   Post-operative Plan: Extubation in OR  Informed Consent: I have reviewed the patients History and Physical, chart, labs and discussed the procedure including the risks, benefits and alternatives for the proposed anesthesia with the patient or authorized representative who has indicated his/her understanding and acceptance.     Dental Advisory Given  Plan Discussed with: Anesthesiologist, CRNA and Surgeon  Anesthesia Plan Comments: (Patient consented for risks of anesthesia including but not limited to:  - adverse  reactions to medications - damage to eyes, teeth, lips or other oral mucosa - nerve damage due to positioning  - sore throat or hoarseness - Damage to heart, brain, nerves, lungs, other parts of body or loss of life  Patient voiced understanding and assent.)       Anesthesia Quick Evaluation

## 2023-09-16 NOTE — Discharge Instructions (Signed)
Transurethral Resection of Bladder Tumor (TURBT) or Bladder Biopsy   Definition:  Transurethral Resection of the Bladder Tumor is a surgical procedure used to diagnose and remove tumors within the bladder. T  General instructions:     Your recent bladder surgery requires very little post hospital care but some definite precautions.  Despite the fact that no skin incisions were used, the area around the bladder incisions are raw and covered with scabs to promote healing and prevent bleeding. Certain precautions are needed to insure that the scabs are not disturbed over the next 2-4 weeks while the healing proceeds.  Because the raw surface inside your bladder and the irritating effects of urine you may expect frequency of urination and/or urgency (a stronger desire to urinate) and perhaps even getting up at night more often. This will usually resolve or improve slowly over the healing period. You may see some blood in your urine over the first 6 weeks. Do not be alarmed, even if the urine was clear for a while. Get off your feet and drink lots of fluids until clearing occurs. If you start to pass clots or don't improve call us.  Diet:  You may return to your normal diet immediately. Because of the raw surface of your bladder, alcohol, spicy foods, foods high in acid and drinks with caffeine may cause irritation or frequency and should be used in moderation. To keep your urine flowing freely and avoid constipation, drink plenty of fluids during the day (8-10 glasses). Tip: Avoid cranberry juice because it is very acidic.  Activity:  Your physical activity doesn't need to be restricted. However, if you are very active, you may see some blood in the urine. We suggest that you reduce your activity under the circumstances until the bleeding has stopped.  Bowels:  It is important to keep your bowels regular during the postoperative period. Straining with bowel movements can cause bleeding. A bowel  movement every other day is reasonable. Use a mild laxative if needed, such as milk of magnesia 2-3 tablespoons, or 2 Dulcolax tablets. Call if you continue to have problems. If you had been taking narcotics for pain, before, during or after your surgery, you may be constipated. Take a laxative if necessary.    Medication:  You should resume your pre-surgery medications unless told not to. In addition you may be given an antibiotic to prevent or treat infection. Antibiotics are not always necessary. All medication should be taken as prescribed until the bottles are finished unless you are having an unusual reaction to one of the drugs.  Rx Pyridium was sent to your pharmacy which will help burning with urination if needed   [facility] [address], [address] [place], [place] [address] [phone]

## 2023-09-16 NOTE — Transfer of Care (Signed)
Immediate Anesthesia Transfer of Care Note  Patient: Thomas Palmer.  Procedure(s) Performed: TRANSURETHRAL RESECTION OF BLADDER TUMOR (TURBT) BLADDER INSTILLATION OF GEMCITABINE  Patient Location: PACU  Anesthesia Type:General  Level of Consciousness: awake, drowsy, and patient cooperative  Airway & Oxygen Therapy: Patient Spontanous Breathing and Patient connected to face mask oxygen  Post-op Assessment: Report given to RN and Post -op Vital signs reviewed and stable  Post vital signs: Reviewed and stable  Last Vitals:  Vitals Value Taken Time  BP 190/88 09/16/23 1038  Temp    Pulse 65 09/16/23 1041  Resp 13 09/16/23 1041  SpO2 100 % 09/16/23 1041  Vitals shown include unfiled device data.  Last Pain:  Vitals:   09/16/23 0739  TempSrc: Oral  PainSc: 0-No pain      Patients Stated Pain Goal: 0 (09/16/23 0739)  Complications: No notable events documented.

## 2023-09-16 NOTE — Op Note (Signed)
    Preoperative diagnosis: Multifocal bladder tumors   Postoperative diagnosis:  Multifocal bladder tumors   Procedure:  Cystoscopy Transurethral resection of bladder tumor (2-5 cm) Instillation of intravesical gemcitabine   Surgeon: Lorin Picket C. Latesha Chesney, M.D.  Anesthesia: General  Complications: None  Intraoperative findings:  Cystoscopy: Urethra normal in caliber without stricture; prominent lateral lobe enlargement with hypervascularity prostate; UOs normal-appearing bilaterally.  Moderate bladder trabeculation with scattered cellules The following bladder tumors were identified: Papillary tumor left bladder neck- 1.5 cm Papillary tumor anterior bladder neck ~ 10:00 measuring 3 cm Papillary tumor right infero--posterior wall just posterolateral to right UO measuring 2 cm 2 small papillary tumors < 5mm posterior wall  EBL: Minimal  Specimens: Tumor left bladder neck Tumor anterior bladder neck Tumor right posterior wall      Indication: [name]. is a 82 y.o. male with a history of microhematuria and cystoscopy remarkable for multifocal bladder tumors.  CT urogram showed no upper tract abnormalities after reviewing the management options for treatment, he elected to proceed with the above surgical procedure(s). We have discussed the potential benefits and risks of the procedure, side effects of the proposed treatment, the likelihood of the patient achieving the goals of the procedure, and any potential problems that might occur during the procedure or recuperation. Informed consent has been obtained.  Description of procedure:  The patient was taken to the operating room and general anesthesia was induced.  The patient was placed in the dorsal lithotomy position, prepped and draped in the usual sterile fashion, and preoperative antibiotics were administered. A preoperative time-out was performed.   A 21 French cystoscope with 30 degree lens was lubricated,  placed per urethra and advanced into the bladder under direct vision with findings as described above  A 24 French continuous-flow resectoscope sheath with obturator was lubricated and passed per urethra.  The obturator was then replaced with an Wandra Scot resectoscope with loop.  The bladder was then re-examined after the resectoscope was placed.  Using bipolar resection, the entire tumors was resected and removed for permanent pathologic analysis.  All resected tumors were on a stalk.  Additional resection of the tumor base was included in the separate specimen containers.  Hemostasis was then achieved with the loop cautery and the bladder was emptied and reinspected with no further bleeding noted at the end of the procedure.  The right posterior wall tumor did not involve the ureteral orifice  The bladder was then emptied and a 20F Foley catheter was placed to gravity drainage.  The patient appeared to tolerate the procedure well and without complications.  The patient was able to be awakened and transferred to the recovery unit in satisfactory condition.  2000 mg of intravesical gemcitabine was instilled to the bladder.  This was allowed to dwell in the PACU for 1 hour.  This was well-tolerated.  After 1 hour, the catheter was drained and removed.   Plan: Catheter will be removed prior to discharge if no significant hematuria noted   Sandhya Denherder C. Lonna Cobb,  MD

## 2023-09-16 NOTE — Anesthesia Procedure Notes (Signed)
Procedure Name: Intubation Date/Time: 09/16/2023 9:36 AM  Performed by: Mohammed Kindle, CRNAPre-anesthesia Checklist: Patient identified, Emergency Drugs available, Suction available and Patient being monitored Patient Re-evaluated:Patient Re-evaluated prior to induction Oxygen Delivery Method: Circle system utilized Preoxygenation: Pre-oxygenation with 100% oxygen Induction Type: IV induction Ventilation: Mask ventilation without difficulty Laryngoscope Size: McGrath and 3 Grade View: Grade I Tube type: Oral Tube size: 7.0 mm Number of attempts: 1 Airway Equipment and Method: Stylet Placement Confirmation: ETT inserted through vocal cords under direct vision, positive ETCO2, breath sounds checked- equal and bilateral and CO2 detector Secured at: 21 cm Tube secured with: Tape Dental Injury: Teeth and Oropharynx as per pre-operative assessment

## 2023-09-16 NOTE — Interval H&P Note (Signed)
History and Physical Interval Note:  09/16/2023 9:30 AM  Thomas Palmer.  has presented today for surgery, with the diagnosis of Bladder Tumor.  The various methods of treatment have been discussed with the patient and family. After consideration of risks, benefits and other options for treatment, the patient has consented to  Procedure(s): TRANSURETHRAL RESECTION OF BLADDER TUMOR (TURBT) (N/A) BLADDER INSTILLATION OF GEMCITABINE (N/A) as a surgical intervention.  The patient's history has been reviewed, patient examined, no change in status, stable for surgery.  I have reviewed the patient's chart and labs.  Questions were answered to the patient's satisfaction.    CV:RRR Lungs:clear  [name]

## 2023-09-16 NOTE — Progress Notes (Signed)
Urine still bloody after 1/1/2 L IV fluids and po fluids. No visual blood clots. Per Dr Lonna Cobb, leave in today and will go to office tomorrow morning for catheter removal.

## 2023-09-16 NOTE — Anesthesia Postprocedure Evaluation (Signed)
Anesthesia Post Note  Patient: Zailen Statzer.  Procedure(s) Performed: TRANSURETHRAL RESECTION OF BLADDER TUMOR (TURBT) BLADDER INSTILLATION OF GEMCITABINE  Patient location during evaluation: PACU Anesthesia Type: General Level of consciousness: awake and alert Pain management: pain level controlled Vital Signs Assessment: post-procedure vital signs reviewed and stable Respiratory status: spontaneous breathing, nonlabored ventilation, respiratory function stable and patient connected to nasal cannula oxygen Cardiovascular status: blood pressure returned to baseline and stable Postop Assessment: no apparent nausea or vomiting Anesthetic complications: no   No notable events documented.   Last Vitals:  Vitals:   09/16/23 1130 09/16/23 1149  BP: (!) 128/99 (!) 149/96  Pulse: 65 60  Resp: 17 18  Temp: (!) 36.2 C (!) 36.1 C  SpO2: 96% 94%    Last Pain:  Vitals:   09/16/23 1149  TempSrc: Temporal  PainSc: 0-No pain                 Cleda Mccreedy Oaklen Thiam

## 2023-09-17 ENCOUNTER — Ambulatory Visit (INDEPENDENT_AMBULATORY_CARE_PROVIDER_SITE_OTHER): Payer: Medicare HMO | Admitting: Urology

## 2023-09-17 DIAGNOSIS — Z466 Encounter for fitting and adjustment of urinary device: Secondary | ICD-10-CM

## 2023-09-17 DIAGNOSIS — D494 Neoplasm of unspecified behavior of bladder: Secondary | ICD-10-CM

## 2023-09-17 NOTE — Progress Notes (Signed)
Catheter Removal  Patient is present today for a catheter removal.  8ml of water was drained from the balloon. A 18FR foley cath was removed from the bladder, no complications were noted. Patient tolerated well.  Performed by: Ples Specter CMA   Follow up/ Additional notes: As scheduled

## 2023-09-18 ENCOUNTER — Encounter: Payer: Medicare HMO | Admitting: Dermatology

## 2023-09-19 LAB — SURGICAL PATHOLOGY

## 2023-10-01 ENCOUNTER — Encounter: Payer: Self-pay | Admitting: Urology

## 2023-10-01 ENCOUNTER — Ambulatory Visit: Payer: Medicare HMO | Admitting: Urology

## 2023-10-01 VITALS — BP 115/65 | HR 76 | Ht 70.0 in | Wt 195.0 lb

## 2023-10-01 DIAGNOSIS — C679 Malignant neoplasm of bladder, unspecified: Secondary | ICD-10-CM

## 2023-10-01 NOTE — Progress Notes (Signed)
I, Thomas Palmer, acting as a scribe for Thomas Altes, MD., have documented all relevant documentation on the behalf of Thomas Altes, MD, as directed by Thomas Altes, MD while in the presence of Thomas Altes, MD.  10/01/2023 3:53 PM   Thomas Palmer. 16-Jan-1941 [id number]  Referring provider: Barbette Reichmann, MD [address] [facility] [place],  [place] [address]  Chief Complaint  Patient presents with   Results   Urologic history: 1. Microhematuria   HPI: Thomas Palmer. is a 82 y.o. male presents for post-op follow-up.   Found to have multifocal bladder tumors on cystoscopy performed for microhematuria. CT urogram showed no upper tract abnormalities. Status post TURBT 09/16/2023 with intraoperative findings remarkable for 5 papillary bladder tumors ranging in size from 0.5-3 cm. All of the tumors larger than 3 cm were on a small stalk and completely resected.  He did receive post-resection gemcitabine.  He has no post-operative complaints and states he has done well. He does find that he is voiding better since the procedure. Pathology: all tumors were low-grade, non-invasive urothelial carcinoma.    PMH: Past Medical History:  Diagnosis Date   Acute appendicitis    Arthritis    "joints in hips and fingers; shoulders too" (07/13/2013)   Chronic lower back pain    COPD (chronic obstructive pulmonary disease) (HCC)    GOLD stage 3   Depression    Diverticulosis of large intestine without hemorrhage    Erythrocytosis 01/01/2021   Gastric ulcer    H/O hiatal hernia    Hyperlipidemia    Hypertension    Hypothyroidism    IDA (iron deficiency anemia)    OP (osteoporosis)    Pneumonia 08/2011   Spinal stenosis of lumbar region with neurogenic claudication     Surgical History: Past Surgical History:  Procedure Laterality Date   ACHILLES TENDON REPAIR Left 2021   APPENDECTOMY     BACK SURGERY     kyphoplasty    BLADDER INSTILLATION N/A 09/16/2023   Procedure: BLADDER INSTILLATION OF GEMCITABINE;  Surgeon: Thomas Altes, MD;  Location: [facility] ORS;  Service: Urology;  Laterality: N/A;   CARDIAC CATHETERIZATION  ~ 2009   COLONOSCOPY WITH PROPOFOL N/A 05/03/2019   Procedure: COLONOSCOPY WITH PROPOFOL;  Surgeon: Thomas Deem, MD;  Location: [facility] ENDOSCOPY;  Service: Endoscopy;  Laterality: N/A;   FIXATION KYPHOPLASTY LUMBAR SPINE  ~ 2012   "L1" (07/13/2013)   JOINT REPLACEMENT     LAPAROSCOPIC APPENDECTOMY N/A 02/22/2018   Procedure: APPENDECTOMY LAPAROSCOPIC;  Surgeon: Thomas Linsey, MD;  Location: [facility] ORS;  Service: General;  Laterality: N/A;   LUMBAR LAMINECTOMY/DECOMPRESSION MICRODISCECTOMY Bilateral 05/08/2023   Procedure: Lumbar three-four, Lumbar four-five LAMINOTOMY, FORAMINOTOMY;  Surgeon: Thomas Stalker, MD;  Location: [facility] OR;  Service: Neurosurgery;  Laterality: Bilateral;   TOTAL HIP ARTHROPLASTY Left 07/12/2013   Procedure: TOTAL HIP ARTHROPLASTY;  Surgeon: Thomas Huh, MD;  Location: [facility] OR;  Service: Orthopedics;  Laterality: Left;   TOTAL HIP ARTHROPLASTY Right 05/17/2002   TRANSURETHRAL RESECTION OF BLADDER TUMOR N/A 09/16/2023   Procedure: TRANSURETHRAL RESECTION OF BLADDER TUMOR (TURBT);  Surgeon: Thomas Altes, MD;  Location: [facility] ORS;  Service: Urology;  Laterality: N/A;    Home Medications:  Allergies as of 10/01/2023       Reactions   Iodine Nausea Only   Contrast Dye - causes severe nausea   Contrast Media [iodinated Contrast Media] Nausea And Vomiting  Medication List        Accurate as of October 01, 2023  3:53 PM. If you have any questions, ask your nurse or doctor.          albuterol 108 (90 Base) MCG/ACT inhaler Commonly known as: VENTOLIN HFA Inhale 2 puffs into the lungs every 6 (six) hours as needed for shortness of breath or wheezing.   B-12 PO Take 1 tablet by mouth daily.   finasteride 5 MG tablet Commonly known as:  PROSCAR Take 5 mg by mouth daily.   ibuprofen 200 MG tablet Commonly known as: ADVIL Take 400 mg by mouth every 6 (six) hours as needed for moderate pain (pain score 4-6).   levothyroxine 200 MCG tablet Commonly known as: SYNTHROID Take 200 mcg by mouth daily before breakfast. Take with 50 mcg to equal 250 mcg daily   levothyroxine 50 MCG tablet Commonly known as: SYNTHROID Take 50 mcg by mouth daily before breakfast. Take with 200 mcg to equal 250 mcg daily   PreserVision AREDS 2 Caps Take 1 capsule by mouth 2 (two) times daily.   multivitamin with minerals tablet Take 1 tablet by mouth daily.   phenazopyridine 100 MG tablet Commonly known as: PYRIDIUM Take 1 tablet (100 mg total) by mouth 3 (three) times daily as needed (burning with urination).   tadalafil 20 MG tablet Commonly known as: CIALIS Take 20 mg by mouth daily as needed for erectile dysfunction.   tamsulosin 0.4 MG Caps capsule Commonly known as: FLOMAX Take 0.8 mg by mouth daily.   TESTOSTERONE COMPOUNDING KIT TD Place 1 mL onto the skin daily. TESTOSTERONE 75MG /ML VERSATILE CR  4 clicks   Trelegy Ellipta 100-62.5-25 MCG/ACT Aepb Generic drug: Fluticasone-Umeclidin-Vilant Inhale 1 puff into the lungs daily.   VITAMIN D PO Take 1 tablet by mouth daily.        Allergies:  Allergies  Allergen Reactions   Iodine Nausea Only    Contrast Dye - causes severe nausea   Contrast Media [Iodinated Contrast Media] Nausea And Vomiting    Family History: Family History  Problem Relation Age of Onset   Skin cancer Father    Stomach cancer Maternal Grandmother     Social History:  reports that he quit smoking about 34 years ago. His smoking use included pipe. He quit smokeless tobacco use about 33 years ago.  His smokeless tobacco use included chew. He reports current alcohol use of about 5.0 standard drinks of alcohol per week. He reports that he does not use drugs.   Physical Exam: BP 115/65   Pulse  76   Ht 5\' 10"  (1.778 m)   Wt 195 lb (88.5 kg)   BMI 27.98 kg/m   Constitutional:  Alert and oriented, No acute distress. HEENT: [place]/A Respiratory: Normal respiratory effort, no increased work of breathing. Psychiatric: Normal mood and affect.   Assessment & Plan:    1.  Urothelial carcinoma of the bladder.  Ta urothelial carcinoma Risk stratification: intermediate We discussed management options for intermediate risk bladder cancer including scheduling a surveillance cystoscopy versus a 6 week course of intravesical gemcitabine. He did receive post-resection gemcitabine. He has elective surveillance and will schedule a follow-up surveillance cystoscopy in office in 3 months.   I have reviewed the above documentation for accuracy and completeness, and I agree with the above.   Thomas Altes, MD  [facility] [address] [place], [place] [address] [phone]

## 2023-10-02 ENCOUNTER — Encounter: Payer: Self-pay | Admitting: Dermatology

## 2023-10-02 ENCOUNTER — Ambulatory Visit: Payer: Medicare HMO | Admitting: Dermatology

## 2023-10-02 DIAGNOSIS — W908XXA Exposure to other nonionizing radiation, initial encounter: Secondary | ICD-10-CM | POA: Diagnosis not present

## 2023-10-02 DIAGNOSIS — L570 Actinic keratosis: Secondary | ICD-10-CM | POA: Diagnosis not present

## 2023-10-02 DIAGNOSIS — L821 Other seborrheic keratosis: Secondary | ICD-10-CM

## 2023-10-02 DIAGNOSIS — L814 Other melanin hyperpigmentation: Secondary | ICD-10-CM | POA: Diagnosis not present

## 2023-10-02 DIAGNOSIS — Z1283 Encounter for screening for malignant neoplasm of skin: Secondary | ICD-10-CM | POA: Diagnosis not present

## 2023-10-02 DIAGNOSIS — L578 Other skin changes due to chronic exposure to nonionizing radiation: Secondary | ICD-10-CM

## 2023-10-02 DIAGNOSIS — Z872 Personal history of diseases of the skin and subcutaneous tissue: Secondary | ICD-10-CM | POA: Diagnosis not present

## 2023-10-02 DIAGNOSIS — L820 Inflamed seborrheic keratosis: Secondary | ICD-10-CM | POA: Diagnosis not present

## 2023-10-02 DIAGNOSIS — D1801 Hemangioma of skin and subcutaneous tissue: Secondary | ICD-10-CM | POA: Diagnosis not present

## 2023-10-02 DIAGNOSIS — D229 Melanocytic nevi, unspecified: Secondary | ICD-10-CM

## 2023-10-02 NOTE — Progress Notes (Signed)
Follow-Up Visit   Subjective  Thomas Palmer. is a 82 y.o. male who presents for the following: Skin Cancer Screening and Full Body Skin Exam, hx of Aks, Pruritus back, 64m, tried otc HC cream didn't help, has been using coconut oil, check spot R ear  The patient presents for Total-Body Skin Exam (TBSE) for skin cancer screening and mole check. The patient has spots, moles and lesions to be evaluated, some may be new or changing and the patient may have concern these could be cancer.    The following portions of the chart were reviewed this encounter and updated as appropriate: medications, allergies, medical history  Review of Systems:  No other skin or systemic complaints except as noted in HPI or Assessment and Plan.  Objective  Well appearing patient in no apparent distress; mood and affect are within normal limits.  A full examination was performed including scalp, head, eyes, ears, nose, lips, neck, chest, axillae, abdomen, back, buttocks, bilateral upper extremities, bilateral lower extremities, hands, feet, fingers, toes, fingernails, and toenails. All findings within normal limits unless otherwise noted below.   Relevant physical exam findings are noted in the Assessment and Plan.  back x 6 (6) Stuck on waxy paps with erythema  Right Ear concha bowl x 1, L forearm x 1, R forearm x 1 (3) Pink scaly macules    Assessment & Plan   SKIN CANCER SCREENING PERFORMED TODAY.  ACTINIC DAMAGE - Chronic condition, secondary to cumulative UV/sun exposure - diffuse scaly erythematous macules with underlying dyspigmentation - Recommend daily broad spectrum sunscreen SPF 30+ to sun-exposed areas, reapply every 2 hours as needed.  - Staying in the shade or wearing long sleeves, sun glasses (UVA+UVB protection) and wide brim hats (4-inch brim around the entire circumference of the hat) are also recommended for sun protection.  - Call for new or changing  lesions.  LENTIGINES, SEBORRHEIC KERATOSES, HEMANGIOMAS - Benign normal skin lesions - Benign-appearing - Call for any changes  MELANOCYTIC NEVI - Tan-brown and/or pink-flesh-colored symmetric macules and papules - Benign appearing on exam today - Observation - Call clinic for new or changing moles - Recommend daily use of broad spectrum spf 30+ sunscreen to sun-exposed areas.    Inflamed seborrheic keratosis (6) back x 6  Symptomatic, irritating, patient would like treated.  Destruction of lesion - back x 6 (6) Complexity: simple   Destruction method: cryotherapy   Informed consent: discussed and consent obtained   Timeout:  patient name, date of birth, surgical site, and procedure verified Lesion destroyed using liquid nitrogen: Yes   Region frozen until ice ball extended beyond lesion: Yes   Cryo cycles: 1 or 2. Outcome: patient tolerated procedure well with no complications   Post-procedure details: wound care instructions given    AK (actinic keratosis) (3) Right Ear concha bowl x 1, L forearm x 1, R forearm x 1  Actinic keratoses are precancerous spots that appear secondary to cumulative UV radiation exposure/sun exposure over time. They are chronic with expected duration over 1 year. A portion of actinic keratoses will progress to squamous cell carcinoma of the skin. It is not possible to reliably predict which spots will progress to skin cancer and so treatment is recommended to prevent development of skin cancer.  Recommend daily broad spectrum sunscreen SPF 30+ to sun-exposed areas, reapply every 2 hours as needed.  Recommend staying in the shade or wearing long sleeves, sun glasses (UVA+UVB protection) and wide brim hats (4-inch brim  around the entire circumference of the hat). Call for new or changing lesions.  Destruction of lesion - Right Ear concha bowl x 1, L forearm x 1, R forearm x 1 (3) Complexity: simple   Destruction method: cryotherapy   Informed  consent: discussed and consent obtained   Timeout:  patient name, date of birth, surgical site, and procedure verified Lesion destroyed using liquid nitrogen: Yes   Region frozen until ice ball extended beyond lesion: Yes   Cryo cycles: 1 or 2. Outcome: patient tolerated procedure well with no complications   Post-procedure details: wound care instructions given    Multiple benign nevi  Lentigines  Actinic elastosis  Seborrheic keratoses  Cherry angioma   Return in about 1 year (around 10/01/2024) for TBSE, Hx of AKs.  I, Ardis Rowan, RMA, am acting as scribe for Elie Goody, MD .   Documentation: I have reviewed the above documentation for accuracy and completeness, and I agree with the above.  Elie Goody, MD

## 2023-10-02 NOTE — Patient Instructions (Addendum)

## 2023-10-03 ENCOUNTER — Encounter: Payer: Self-pay | Admitting: Urology

## 2023-10-06 ENCOUNTER — Encounter: Payer: Self-pay | Admitting: Dermatology

## 2023-12-30 DIAGNOSIS — J449 Chronic obstructive pulmonary disease, unspecified: Secondary | ICD-10-CM | POA: Diagnosis not present

## 2023-12-31 ENCOUNTER — Other Ambulatory Visit: Payer: Medicare HMO | Admitting: Urology

## 2024-01-01 DIAGNOSIS — R7309 Other abnormal glucose: Secondary | ICD-10-CM | POA: Diagnosis not present

## 2024-01-01 DIAGNOSIS — Z125 Encounter for screening for malignant neoplasm of prostate: Secondary | ICD-10-CM | POA: Diagnosis not present

## 2024-01-01 DIAGNOSIS — E039 Hypothyroidism, unspecified: Secondary | ICD-10-CM | POA: Diagnosis not present

## 2024-01-01 DIAGNOSIS — E291 Testicular hypofunction: Secondary | ICD-10-CM | POA: Diagnosis not present

## 2024-01-01 DIAGNOSIS — R3129 Other microscopic hematuria: Secondary | ICD-10-CM | POA: Diagnosis not present

## 2024-01-08 DIAGNOSIS — M48061 Spinal stenosis, lumbar region without neurogenic claudication: Secondary | ICD-10-CM | POA: Diagnosis not present

## 2024-01-08 DIAGNOSIS — H9193 Unspecified hearing loss, bilateral: Secondary | ICD-10-CM | POA: Diagnosis not present

## 2024-01-08 DIAGNOSIS — N529 Male erectile dysfunction, unspecified: Secondary | ICD-10-CM | POA: Diagnosis not present

## 2024-01-08 DIAGNOSIS — E039 Hypothyroidism, unspecified: Secondary | ICD-10-CM | POA: Diagnosis not present

## 2024-01-08 DIAGNOSIS — L821 Other seborrheic keratosis: Secondary | ICD-10-CM | POA: Diagnosis not present

## 2024-01-08 DIAGNOSIS — D751 Secondary polycythemia: Secondary | ICD-10-CM | POA: Diagnosis not present

## 2024-01-08 DIAGNOSIS — Z0000 Encounter for general adult medical examination without abnormal findings: Secondary | ICD-10-CM | POA: Diagnosis not present

## 2024-01-08 DIAGNOSIS — Z96643 Presence of artificial hip joint, bilateral: Secondary | ICD-10-CM | POA: Diagnosis not present

## 2024-01-09 ENCOUNTER — Encounter: Payer: Self-pay | Admitting: Urology

## 2024-01-09 ENCOUNTER — Ambulatory Visit (INDEPENDENT_AMBULATORY_CARE_PROVIDER_SITE_OTHER): Payer: Medicare HMO | Admitting: Urology

## 2024-01-09 DIAGNOSIS — C679 Malignant neoplasm of bladder, unspecified: Secondary | ICD-10-CM

## 2024-01-09 LAB — URINALYSIS, COMPLETE
Bilirubin, UA: NEGATIVE
Glucose, UA: NEGATIVE
Ketones, UA: NEGATIVE
Leukocytes,UA: NEGATIVE
Nitrite, UA: NEGATIVE
Protein,UA: NEGATIVE
RBC, UA: NEGATIVE
Specific Gravity, UA: 1.015 (ref 1.005–1.030)
Urobilinogen, Ur: 0.2 mg/dL (ref 0.2–1.0)
pH, UA: 7 (ref 5.0–7.5)

## 2024-01-09 LAB — MICROSCOPIC EXAMINATION: Bacteria, UA: NONE SEEN

## 2024-01-09 NOTE — Progress Notes (Signed)
   01/09/24  CC:  Chief Complaint  Patient presents with   Cysto   Urologic history: Ta urothelial carcinoma the bladder-low-grade TURBT 09/16/2023 for multifocal bladder tumors largest measuring 3 cm anterior bladder neck.  All resected tumors were on a small stalk Postresection gemcitabine Intermediate risk disease based on tumor volume; declined 6-week course intravesical chemotherapy  HPI: Presents for initial postresection surveillance cystoscopy and has no complaints.  Denies gross hematuria.  UA today without microhematuria  Refer to rooming tab for vitals NED. A&Ox3.   No respiratory distress   Abd soft, NT, ND Normal phallus with bilateral descended testicles  Cystoscopy Procedure Note  Patient identification was confirmed, informed consent was obtained, and patient was prepped using Betadine solution.  Lidocaine jelly was administered per urethral meatus.     Pre-Procedure: - Inspection reveals a normal caliber urethral meatus.  Procedure: The flexible cystoscope was introduced without difficulty - No urethral strictures/lesions are present. -Prominent lateral lobe enlargement with hypervascularity prostate  -Mild elevation bladder neck - Bilateral ureteral orifices identified - Bladder mucosa  reveals no ulcers, tumors, or lesions - No bladder stones -Mild trabeculation  Retroflexion shows no bladder neck tumors   Post-Procedure: - Patient tolerated the procedure well  Assessment/ Plan: No recurrent papillary tumor Follow-up surveillance cystoscopy 3-4 months   [name], MD

## 2024-01-13 ENCOUNTER — Encounter: Payer: Self-pay | Admitting: Urology

## 2024-01-22 ENCOUNTER — Inpatient Hospital Stay: Payer: Medicare HMO | Attending: Oncology

## 2024-01-22 ENCOUNTER — Inpatient Hospital Stay: Payer: Medicare HMO

## 2024-01-22 VITALS — BP 116/78 | HR 64 | Temp 97.6°F

## 2024-01-22 DIAGNOSIS — D751 Secondary polycythemia: Secondary | ICD-10-CM | POA: Diagnosis not present

## 2024-01-22 LAB — CBC WITH DIFFERENTIAL (CANCER CENTER ONLY)
Abs Immature Granulocytes: 0.04 10*3/uL (ref 0.00–0.07)
Basophils Absolute: 0 10*3/uL (ref 0.0–0.1)
Basophils Relative: 1 %
Eosinophils Absolute: 0.1 10*3/uL (ref 0.0–0.5)
Eosinophils Relative: 2 %
HCT: 51.9 % (ref 39.0–52.0)
Hemoglobin: 17.3 g/dL — ABNORMAL HIGH (ref 13.0–17.0)
Immature Granulocytes: 1 %
Lymphocytes Relative: 14 %
Lymphs Abs: 1 10*3/uL (ref 0.7–4.0)
MCH: 30.1 pg (ref 26.0–34.0)
MCHC: 33.3 g/dL (ref 30.0–36.0)
MCV: 90.4 fL (ref 80.0–100.0)
Monocytes Absolute: 0.7 10*3/uL (ref 0.1–1.0)
Monocytes Relative: 10 %
Neutro Abs: 5.2 10*3/uL (ref 1.7–7.7)
Neutrophils Relative %: 72 %
Platelet Count: 155 10*3/uL (ref 150–400)
RBC: 5.74 MIL/uL (ref 4.22–5.81)
RDW: 12.6 % (ref 11.5–15.5)
WBC Count: 7.2 10*3/uL (ref 4.0–10.5)
nRBC: 0 % (ref 0.0–0.2)

## 2024-01-30 DIAGNOSIS — H353134 Nonexudative age-related macular degeneration, bilateral, advanced atrophic with subfoveal involvement: Secondary | ICD-10-CM | POA: Diagnosis not present

## 2024-01-30 DIAGNOSIS — H52223 Regular astigmatism, bilateral: Secondary | ICD-10-CM | POA: Diagnosis not present

## 2024-01-30 DIAGNOSIS — H5203 Hypermetropia, bilateral: Secondary | ICD-10-CM | POA: Diagnosis not present

## 2024-01-30 DIAGNOSIS — Z9842 Cataract extraction status, left eye: Secondary | ICD-10-CM | POA: Diagnosis not present

## 2024-01-30 DIAGNOSIS — Z9841 Cataract extraction status, right eye: Secondary | ICD-10-CM | POA: Diagnosis not present

## 2024-02-23 DIAGNOSIS — H353132 Nonexudative age-related macular degeneration, bilateral, intermediate dry stage: Secondary | ICD-10-CM | POA: Diagnosis not present

## 2024-02-23 DIAGNOSIS — Z961 Presence of intraocular lens: Secondary | ICD-10-CM | POA: Diagnosis not present

## 2024-02-23 DIAGNOSIS — H43813 Vitreous degeneration, bilateral: Secondary | ICD-10-CM | POA: Diagnosis not present

## 2024-03-11 DIAGNOSIS — E039 Hypothyroidism, unspecified: Secondary | ICD-10-CM | POA: Diagnosis not present

## 2024-03-11 DIAGNOSIS — I4510 Unspecified right bundle-branch block: Secondary | ICD-10-CM | POA: Diagnosis not present

## 2024-03-11 DIAGNOSIS — R030 Elevated blood-pressure reading, without diagnosis of hypertension: Secondary | ICD-10-CM | POA: Diagnosis not present

## 2024-03-11 DIAGNOSIS — R9431 Abnormal electrocardiogram [ECG] [EKG]: Secondary | ICD-10-CM | POA: Diagnosis not present

## 2024-04-01 DIAGNOSIS — I10 Essential (primary) hypertension: Secondary | ICD-10-CM | POA: Diagnosis not present

## 2024-04-26 DIAGNOSIS — H353132 Nonexudative age-related macular degeneration, bilateral, intermediate dry stage: Secondary | ICD-10-CM | POA: Diagnosis not present

## 2024-05-05 ENCOUNTER — Encounter: Payer: Self-pay | Admitting: Oncology

## 2024-05-12 ENCOUNTER — Ambulatory Visit: Admitting: Urology

## 2024-05-12 ENCOUNTER — Encounter: Payer: Self-pay | Admitting: Oncology

## 2024-05-12 ENCOUNTER — Encounter: Payer: Self-pay | Admitting: Urology

## 2024-05-12 VITALS — BP 135/80 | HR 60 | Ht 71.0 in | Wt 188.0 lb

## 2024-05-12 DIAGNOSIS — C679 Malignant neoplasm of bladder, unspecified: Secondary | ICD-10-CM | POA: Diagnosis not present

## 2024-05-12 LAB — URINALYSIS, COMPLETE
Bilirubin, UA: NEGATIVE
Glucose, UA: NEGATIVE
Ketones, UA: NEGATIVE
Leukocytes,UA: NEGATIVE
Nitrite, UA: NEGATIVE
Protein,UA: NEGATIVE
RBC, UA: NEGATIVE
Specific Gravity, UA: 1.02 (ref 1.005–1.030)
Urobilinogen, Ur: 0.2 mg/dL (ref 0.2–1.0)
pH, UA: 6.5 (ref 5.0–7.5)

## 2024-05-12 LAB — MICROSCOPIC EXAMINATION
Mucus, UA: NONE SEEN
RBC, Urine: NONE SEEN /HPF (ref 0–2)

## 2024-05-12 NOTE — Progress Notes (Signed)
   05/12/24  CC:  Chief Complaint  Patient presents with   Cysto   Urologic history: Ta urothelial carcinoma the bladder-low-grade TURBT 09/16/2023 for multifocal bladder tumors largest measuring 3 cm anterior bladder neck.  All resected tumors were on a small stalk Postresection gemcitabine  Intermediate risk disease based on tumor volume; declined 6-week course intravesical chemotherapy  HPI: Presents for initial postresection surveillance cystoscopy and has no complaints.  Denies gross hematuria.  UA today without microhematuria  Refer to rooming tab for vitals NED. A&Ox3.     Cystoscopy Procedure Note  Patient identification was confirmed, informed consent was obtained, and patient was prepped using Betadine solution.  Lidocaine  jelly was administered per urethral meatus.     Pre-Procedure: - Inspection reveals a normal caliber urethral meatus.  Procedure: The flexible cystoscope was introduced without difficulty - No urethral strictures/lesions are present. -Prominent lateral lobe enlargement with hypervascularity prostate  -Mild elevation bladder neck - Bilateral ureteral orifices identified - Bladder mucosa  reveals no ulcers, tumors, or lesions - No bladder stones -Mild trabeculation  Retroflexion shows no bladder neck tumors   Post-Procedure: - Patient tolerated the procedure well  Assessment/ Plan: No recurrent papillary tumor Follow-up surveillance cystoscopy 3-4 months   Glendia JAYSON Barba, MD

## 2024-05-13 DIAGNOSIS — I10 Essential (primary) hypertension: Secondary | ICD-10-CM | POA: Diagnosis not present

## 2024-07-01 DIAGNOSIS — J449 Chronic obstructive pulmonary disease, unspecified: Secondary | ICD-10-CM | POA: Diagnosis not present

## 2024-07-08 DIAGNOSIS — M47816 Spondylosis without myelopathy or radiculopathy, lumbar region: Secondary | ICD-10-CM | POA: Diagnosis not present

## 2024-07-08 DIAGNOSIS — C679 Malignant neoplasm of bladder, unspecified: Secondary | ICD-10-CM | POA: Diagnosis not present

## 2024-07-08 DIAGNOSIS — H9193 Unspecified hearing loss, bilateral: Secondary | ICD-10-CM | POA: Diagnosis not present

## 2024-07-08 DIAGNOSIS — D751 Secondary polycythemia: Secondary | ICD-10-CM | POA: Diagnosis not present

## 2024-07-08 DIAGNOSIS — Z96643 Presence of artificial hip joint, bilateral: Secondary | ICD-10-CM | POA: Diagnosis not present

## 2024-07-08 DIAGNOSIS — Z0000 Encounter for general adult medical examination without abnormal findings: Secondary | ICD-10-CM | POA: Diagnosis not present

## 2024-07-08 DIAGNOSIS — E039 Hypothyroidism, unspecified: Secondary | ICD-10-CM | POA: Diagnosis not present

## 2024-07-08 DIAGNOSIS — N528 Other male erectile dysfunction: Secondary | ICD-10-CM | POA: Diagnosis not present

## 2024-07-15 DIAGNOSIS — M47816 Spondylosis without myelopathy or radiculopathy, lumbar region: Secondary | ICD-10-CM | POA: Diagnosis not present

## 2024-07-15 DIAGNOSIS — E039 Hypothyroidism, unspecified: Secondary | ICD-10-CM | POA: Diagnosis not present

## 2024-07-15 DIAGNOSIS — I10 Essential (primary) hypertension: Secondary | ICD-10-CM | POA: Diagnosis not present

## 2024-07-15 DIAGNOSIS — M792 Neuralgia and neuritis, unspecified: Secondary | ICD-10-CM | POA: Diagnosis not present

## 2024-07-15 DIAGNOSIS — J449 Chronic obstructive pulmonary disease, unspecified: Secondary | ICD-10-CM | POA: Diagnosis not present

## 2024-07-15 DIAGNOSIS — R7989 Other specified abnormal findings of blood chemistry: Secondary | ICD-10-CM | POA: Diagnosis not present

## 2024-07-22 ENCOUNTER — Inpatient Hospital Stay: Payer: Medicare HMO

## 2024-07-22 ENCOUNTER — Encounter: Payer: Self-pay | Admitting: Oncology

## 2024-07-22 ENCOUNTER — Inpatient Hospital Stay (HOSPITAL_BASED_OUTPATIENT_CLINIC_OR_DEPARTMENT_OTHER): Payer: Medicare HMO | Admitting: Oncology

## 2024-07-22 ENCOUNTER — Inpatient Hospital Stay: Payer: Medicare HMO | Attending: Nurse Practitioner

## 2024-07-22 VITALS — BP 114/75 | HR 65 | Temp 95.5°F | Resp 20 | Ht 71.0 in | Wt 193.2 lb

## 2024-07-22 VITALS — BP 117/70 | HR 56 | Resp 18

## 2024-07-22 DIAGNOSIS — D751 Secondary polycythemia: Secondary | ICD-10-CM

## 2024-07-22 DIAGNOSIS — M810 Age-related osteoporosis without current pathological fracture: Secondary | ICD-10-CM | POA: Insufficient documentation

## 2024-07-22 DIAGNOSIS — Z87891 Personal history of nicotine dependence: Secondary | ICD-10-CM | POA: Insufficient documentation

## 2024-07-22 DIAGNOSIS — Z79890 Hormone replacement therapy (postmenopausal): Secondary | ICD-10-CM | POA: Insufficient documentation

## 2024-07-22 LAB — CBC WITH DIFFERENTIAL (CANCER CENTER ONLY)
Abs Immature Granulocytes: 0.04 K/uL (ref 0.00–0.07)
Basophils Absolute: 0.1 K/uL (ref 0.0–0.1)
Basophils Relative: 1 %
Eosinophils Absolute: 0.1 K/uL (ref 0.0–0.5)
Eosinophils Relative: 2 %
HCT: 51.1 % (ref 39.0–52.0)
Hemoglobin: 16.8 g/dL (ref 13.0–17.0)
Immature Granulocytes: 1 %
Lymphocytes Relative: 14 %
Lymphs Abs: 1 K/uL (ref 0.7–4.0)
MCH: 28.8 pg (ref 26.0–34.0)
MCHC: 32.9 g/dL (ref 30.0–36.0)
MCV: 87.7 fL (ref 80.0–100.0)
Monocytes Absolute: 0.6 K/uL (ref 0.1–1.0)
Monocytes Relative: 9 %
Neutro Abs: 5.1 K/uL (ref 1.7–7.7)
Neutrophils Relative %: 73 %
Platelet Count: 140 K/uL — ABNORMAL LOW (ref 150–400)
RBC: 5.83 MIL/uL — ABNORMAL HIGH (ref 4.22–5.81)
RDW: 14.4 % (ref 11.5–15.5)
WBC Count: 6.9 K/uL (ref 4.0–10.5)
nRBC: 0 % (ref 0.0–0.2)

## 2024-07-22 MED ORDER — SODIUM CHLORIDE 0.9 % IV SOLN
Freq: Once | INTRAVENOUS | Status: DC
Start: 2024-07-22 — End: 2024-07-22
  Filled 2024-07-22: qty 250

## 2024-07-22 NOTE — Progress Notes (Signed)
 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 Labs reviewed and discussed with patient Hct is >50, recommend phlebotomy x 1  Orders Placed This Encounter  Procedures   Hemoglobin and Hematocrit (Cancer Center Only)    Standing Status:   Future    Expected Date:   01/19/2025    Expiration Date:   04/19/2025   CBC (Cancer Center Only)    Standing Status:   Future    Expected Date:   07/22/2025    Expiration Date:   10/20/2025   Follow up  6 months Lab cbc + Phleb 12 months lab MD cbc + Phleb All questions were answered. The patient knows to call the clinic with any problems, questions or concerns.  Call Babara, MD, PhD [facility] Hematology Oncology 07/22/2024   CHIEF COMPLAINTS/REASON FOR VISIT:  Secondary erythrocytosis  HISTORY OF PRESENTING ILLNESS:  Thomas Penna. is a 83 y.o. male who was seen in consultation at the request of Sadie Manna, MD for evaluation of polycytosis/erythrocytosis Reviewed recent labs done at the primary care provider's office. 12/14/2020, hemoglobin 19.4, hematocrit 59.2, MCV 94.7, WBC 5.6, Chronic Onset, intermittent, duration since 2020. No aggravating or alleviating factors.   Associated signs or symptoms: Denies weight loss, fever, chills, fatigue, night sweats.   Context:  Smoking history: Former smoker. Testosterone  supplements: Patient is on testosterone  cream. History of blood clots: Denies Daytime somnolence: Denies Family history of polycythemia: Denies  INTERVAL HISTORY Thomas Palmer. is a 83 y.o. male who has above history reviewed by me today presents for follow up visit for secondary erythrocytosis. Patient reports feeling well today He is on testosterone   replacement therapy-75 mg. He has no new complaints.  He tolerated phlebotomy sessions. During the interval, he had Transurethral resection of noninvasive low-grade papillary urothelial carcinoma in Nov 2024    Review of Systems  Constitutional:  Positive for fatigue. Negative for appetite change, chills, fever and unexpected weight change.  HENT:   Negative for hearing loss and voice change.   Eyes:  Negative for eye problems and icterus.  Respiratory:  Negative for chest tightness, cough and shortness of breath.   Cardiovascular:  Negative for chest pain and leg swelling.  Gastrointestinal:  Negative for abdominal distention and abdominal pain.  Endocrine: Negative for hot flashes.  Genitourinary:  Negative for difficulty urinating, dysuria and frequency.   Musculoskeletal:  Negative for arthralgias.  Skin:  Negative for itching and rash.  Neurological:  Negative for light-headedness and numbness.  Hematological:  Negative for adenopathy. Does not bruise/bleed easily.  Psychiatric/Behavioral:  Negative for confusion.     MEDICAL HISTORY:  Past Medical History:  Diagnosis Date   Actinic keratosis    Acute appendicitis    Arthritis    joints in hips and fingers; shoulders too (07/13/2013)   Chronic lower back pain    COPD (chronic obstructive pulmonary disease) (HCC)    GOLD stage 3   Depression    Diverticulosis of large intestine without hemorrhage    Erythrocytosis 01/01/2021   Gastric ulcer    H/O hiatal hernia    Hyperlipidemia    Hypertension    Hypothyroidism    IDA (iron deficiency anemia)    OP (osteoporosis)    Pneumonia 08/2011   Spinal stenosis of lumbar region with  neurogenic claudication     SURGICAL HISTORY: Past Surgical History:  Procedure Laterality Date   ACHILLES TENDON REPAIR Left 2021   APPENDECTOMY     BACK SURGERY     kyphoplasty   BLADDER INSTILLATION N/A 09/16/2023   Procedure: BLADDER INSTILLATION OF GEMCITABINE ;  Surgeon: Twylla Glendia BROCKS, MD;  Location: [facility] ORS;  Service: Urology;  Laterality: N/A;   CARDIAC CATHETERIZATION  ~ 2009   COLONOSCOPY WITH PROPOFOL  N/A 05/03/2019   Procedure: COLONOSCOPY WITH PROPOFOL ;  Surgeon: Gaylyn Gladis PENNER, MD;  Location: [facility] ENDOSCOPY;  Service: Endoscopy;  Laterality: N/A;   FIXATION KYPHOPLASTY LUMBAR SPINE  ~ 2012   L1 (07/13/2013)   JOINT REPLACEMENT     LAPAROSCOPIC APPENDECTOMY N/A 02/22/2018   Procedure: APPENDECTOMY LAPAROSCOPIC;  Surgeon: Nicholaus Selinda Birmingham, MD;  Location: [facility] ORS;  Service: General;  Laterality: N/A;   LUMBAR LAMINECTOMY/DECOMPRESSION MICRODISCECTOMY Bilateral 05/08/2023   Procedure: Lumbar three-four, Lumbar four-five LAMINOTOMY, FORAMINOTOMY;  Surgeon: Mavis Purchase, MD;  Location: [facility] OR;  Service: Neurosurgery;  Laterality: Bilateral;   TOTAL HIP ARTHROPLASTY Left 07/12/2013   Procedure: TOTAL HIP ARTHROPLASTY;  Surgeon: Marcey Raman, MD;  Location: [facility] OR;  Service: Orthopedics;  Laterality: Left;   TOTAL HIP ARTHROPLASTY Right 05/17/2002   TRANSURETHRAL RESECTION OF BLADDER TUMOR N/A 09/16/2023   Procedure: TRANSURETHRAL RESECTION OF BLADDER TUMOR (TURBT);  Surgeon: Twylla Glendia BROCKS, MD;  Location: [facility] ORS;  Service: Urology;  Laterality: N/A;    SOCIAL HISTORY: Social History   Socioeconomic History   Marital status: Married    Spouse name: brenda   Number of children: Not on file   Years of education: Not on file   Highest education level: Not on file  Occupational History   Not on file  Tobacco Use   Smoking status: Former    Types: Pipe    Quit date: 07/07/1989    Years since quitting: 35.0   Smokeless tobacco: Former    Types: Chew    Quit date: 10/28/1989  Vaping Use   Vaping status: Never Used  Substance and Sexual Activity   Alcohol  use: Yes    Alcohol /week: 5.0 standard drinks of alcohol     Types: 5 Cans of beer per week   Drug use: No   Sexual activity: Yes  Other Topics Concern   Not on file  Social History  Narrative   Not on file   Social Drivers of Health   Financial Resource Strain: Low Risk  (04/01/2024)   Received from [facility]   Overall Financial Resource Strain (CARDIA)    Difficulty of Paying Living Expenses: Not hard at all  Food Insecurity: No Food Insecurity (04/01/2024)   Received from [facility]   Hunger Vital Sign    Within the past 12 months, you worried that your food would run out before you got the money to buy more.: Never true    Within the past 12 months, the food you bought just didn't last and you didn't have money to get more.: Never true  Transportation Needs: No Transportation Needs (04/01/2024)   Received from [facility] - Transportation    In the past 12 months, has lack of transportation kept you from medical appointments or from getting medications?: No    Lack of Transportation (Non-Medical): No  Physical Activity: Not on file  Stress: Not on file  Social Connections: Not on file  Intimate Partner Violence: Not on file  FAMILY HISTORY: Family History  Problem Relation Age of Onset   Skin cancer Father    Stomach cancer Maternal Grandmother     ALLERGIES:  is allergic to iodine and contrast media [iodinated contrast media].  MEDICATIONS:  Current Outpatient Medications  Medication Sig Dispense Refill   Cyanocobalamin (B-12 PO) Take 1 tablet by mouth daily.     finasteride  (PROSCAR ) 5 MG tablet Take 5 mg by mouth daily.     Fluticasone -Umeclidin-Vilant (TRELEGY ELLIPTA) 100-62.5-25 MCG/ACT AEPB Inhale 1 puff into the lungs daily.     levothyroxine  (SYNTHROID ) 50 MCG tablet Take 50 mcg by mouth daily before breakfast. Take with 200 mcg to equal 250 mcg daily     levothyroxine  (SYNTHROID , LEVOTHROID) 200 MCG tablet Take 200 mcg by mouth daily before breakfast. Take with 50 mcg to equal 250 mcg daily     Multiple Vitamins-Minerals (PRESERVISION AREDS 2) CAPS Take 1 capsule by mouth 2  (two) times daily.     tadalafil (CIALIS) 20 MG tablet Take 20 mg by mouth daily as needed for erectile dysfunction.     TESTOSTERONE  COMPOUNDING KIT TD Place 1 mL onto the skin daily. TESTOSTERONE  75MG /ML VERSATILE CR  4 clicks     VITAMIN D PO Take 1 tablet by mouth daily.     losartan (COZAAR) 25 MG tablet Take 25 mg by mouth daily.     No current facility-administered medications for this visit.     PHYSICAL EXAMINATION: ECOG PERFORMANCE STATUS: 1 - Symptomatic but completely ambulatory Vitals:   07/22/24 1343  BP: 114/75  Pulse: 65  Resp: 20  Temp: (!) 95.5 F (35.3 C)  SpO2: 96%   Filed Weights   07/22/24 1343  Weight: 193 lb 3.2 oz (87.6 kg)    Physical Exam Constitutional:      General: He is not in acute distress. HENT:     Head: Normocephalic and atraumatic.  Eyes:     General: No scleral icterus. Cardiovascular:     Rate and Rhythm: Normal rate and regular rhythm.  Pulmonary:     Effort: Pulmonary effort is normal. No respiratory distress.  Abdominal:     General: There is no distension.  Musculoskeletal:        General: No deformity. Normal range of motion.     Cervical back: Normal range of motion and neck supple.  Skin:    General: Skin is warm.     Findings: No erythema or rash.  Neurological:     Mental Status: He is alert and oriented to person, place, and time. Mental status is at baseline.  Psychiatric:        Mood and Affect: Mood normal.     RADIOGRAPHIC STUDIES: I have personally reviewed the radiological images as listed and agreed with the findings in the report. No results found.   LABORATORY DATA:  I have reviewed the data as listed    Latest Ref Rng & Units 07/22/2024    1:28 PM 01/22/2024   12:34 PM 07/24/2023   12:59 PM  CBC  WBC 4.0 - 10.5 K/uL 6.9  7.2  7.7   Hemoglobin 13.0 - 17.0 g/dL 83.1  82.6  83.6   Hematocrit 39.0 - 52.0 % 51.1  51.9  51.5   Platelets 150 - 400 K/uL 140  155  153

## 2024-07-22 NOTE — Progress Notes (Signed)
 Thomas Palmer. presents today for phlebotomy per MD orders. Phlebotomy procedure started at 1417 and ended at 1426. 300 mls removed. Patient tolerated procedure well. IV needle removed intact.

## 2024-07-22 NOTE — Assessment & Plan Note (Addendum)
 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 Labs reviewed and discussed with patient Hct is >50, recommend phlebotomy x 1

## 2024-07-22 NOTE — Patient Instructions (Signed)

## 2024-07-22 NOTE — Progress Notes (Signed)
 Patient has no new or acute concerns at this time.

## 2024-07-24 ENCOUNTER — Emergency Department

## 2024-07-24 ENCOUNTER — Other Ambulatory Visit: Payer: Self-pay

## 2024-07-24 ENCOUNTER — Emergency Department
Admission: EM | Admit: 2024-07-24 | Discharge: 2024-07-24 | Disposition: A | Discharge status: Home / Self Care | Attending: Emergency Medicine | Admitting: Emergency Medicine

## 2024-07-24 ENCOUNTER — Other Ambulatory Visit

## 2024-07-24 DIAGNOSIS — N281 Cyst of kidney, acquired: Secondary | ICD-10-CM | POA: Diagnosis not present

## 2024-07-24 DIAGNOSIS — R109 Unspecified abdominal pain: Secondary | ICD-10-CM | POA: Diagnosis not present

## 2024-07-24 DIAGNOSIS — I10 Essential (primary) hypertension: Secondary | ICD-10-CM | POA: Insufficient documentation

## 2024-07-24 DIAGNOSIS — R1012 Left upper quadrant pain: Secondary | ICD-10-CM | POA: Diagnosis not present

## 2024-07-24 DIAGNOSIS — J449 Chronic obstructive pulmonary disease, unspecified: Secondary | ICD-10-CM | POA: Insufficient documentation

## 2024-07-24 DIAGNOSIS — K7689 Other specified diseases of liver: Secondary | ICD-10-CM | POA: Diagnosis not present

## 2024-07-24 DIAGNOSIS — K5730 Diverticulosis of large intestine without perforation or abscess without bleeding: Secondary | ICD-10-CM | POA: Diagnosis not present

## 2024-07-24 LAB — URINALYSIS, ROUTINE W REFLEX MICROSCOPIC
Bilirubin Urine: NEGATIVE
Glucose, UA: NEGATIVE mg/dL
Hgb urine dipstick: NEGATIVE
Ketones, ur: NEGATIVE mg/dL
Leukocytes,Ua: NEGATIVE
Nitrite: NEGATIVE
Protein, ur: NEGATIVE mg/dL
Specific Gravity, Urine: 1.01 (ref 1.005–1.030)
pH: 6 (ref 5.0–8.0)

## 2024-07-24 LAB — CBC
HCT: 51.3 % (ref 39.0–52.0)
Hemoglobin: 17.2 g/dL — ABNORMAL HIGH (ref 13.0–17.0)
MCH: 29.1 pg (ref 26.0–34.0)
MCHC: 33.5 g/dL (ref 30.0–36.0)
MCV: 86.7 fL (ref 80.0–100.0)
Platelets: 138 K/uL — ABNORMAL LOW (ref 150–400)
RBC: 5.92 MIL/uL — ABNORMAL HIGH (ref 4.22–5.81)
RDW: 14.4 % (ref 11.5–15.5)
WBC: 7.7 K/uL (ref 4.0–10.5)
nRBC: 0 % (ref 0.0–0.2)

## 2024-07-24 LAB — COMPREHENSIVE METABOLIC PANEL WITH GFR
ALT: 18 U/L (ref 0–44)
AST: 23 U/L (ref 15–41)
Albumin: 4 g/dL (ref 3.5–5.0)
Alkaline Phosphatase: 45 U/L (ref 38–126)
Anion gap: 10 (ref 5–15)
BUN: 19 mg/dL (ref 8–23)
CO2: 26 mmol/L (ref 22–32)
Calcium: 8.6 mg/dL — ABNORMAL LOW (ref 8.9–10.3)
Chloride: 95 mmol/L — ABNORMAL LOW (ref 98–111)
Creatinine, Ser: 0.78 mg/dL (ref 0.61–1.24)
GFR, Estimated: >60 mL/min (ref >60)
Glucose, Bld: 103 mg/dL — ABNORMAL HIGH (ref 70–99)
Potassium: 4.5 mmol/L (ref 3.5–5.1)
Sodium: 131 mmol/L — ABNORMAL LOW (ref 135–145)
Total Bilirubin: 0.8 mg/dL (ref 0.0–1.2)
Total Protein: 7.2 g/dL (ref 6.5–8.1)

## 2024-07-24 LAB — LIPASE, BLOOD: Lipase: 15 U/L (ref 11–51)

## 2024-07-24 MED ORDER — POLYETHYLENE GLYCOL 3350 17 GM/SCOOP PO POWD: 17.0000 g | Freq: Every day | ORAL | 0 refills | Status: DC

## 2024-07-24 NOTE — ED Notes (Signed)
 First nurse note: To ED from [facility] for L abdominal pain that feels like gas. MD at [facility] thinks may be PUD or diverticulitis. Pt is ambulatory. [facility] VSS.

## 2024-07-24 NOTE — ED Provider Notes (Signed)
 [facility] Provider Note    Event Date/Time   First MD Initiated Contact with Patient 07/24/24 1444     (approximate)  History   Chief Complaint: Abdominal Pain and Flank Pain  HPI  Yue Glasheen. is a 83 y.o. male with a past medical history of COPD, hypertension, hyperlipidemia, history of diverticulitis presents to the emergency department for left-sided abdominal pain.  According to the patient the last 24 hours he has been experiencing intermittent pain in the left side of his abdomen.  Denies any nausea vomiting or diarrhea.  No fever.  History of diverticulitis but states that pain was lower in his abdomen last time it happened.  No urinary symptoms.  Physical Exam   Triage Vital Signs: ED Triage Vitals  Encounter Vitals Group     BP 07/24/24 1247 (!) 166/87     Girls Systolic BP Percentile --      Girls Diastolic BP Percentile --      Boys Systolic BP Percentile --      Boys Diastolic BP Percentile --      Pulse Rate 07/24/24 1247 61     Resp 07/24/24 1247 18     Temp 07/24/24 1247 97.6 F (36.4 C)     Temp Source 07/24/24 1247 Oral     SpO2 07/24/24 1247 96 %     Weight 07/24/24 1248 193 lb (87.5 kg)     Height 07/24/24 1248 5' 11 (1.803 m)     Head Circumference --      Peak Flow --      Pain Score 07/24/24 1248 5     Pain Loc --      Pain Education --      Exclude from Growth Chart --     Most recent vital signs: Vitals:   07/24/24 1247  BP: (!) 166/87  Pulse: 61  Resp: 18  Temp: 97.6 F (36.4 C)  SpO2: 96%    General: Awake, no distress.  CV:  Good peripheral perfusion.  Regular rate and rhythm  Resp:  Normal effort.  Equal breath sounds bilaterally.  Abd:  No distention.  Soft, minimal left upper quadrant left mid abdominal tenderness palpation.  No rebound or guarding.  The abdomen is otherwise benign.  ED Results / Procedures / Treatments   RADIOLOGY  I have reviewed and interpreted the CT images.   Patient has some mild dilation of small bowel loops but no obvious obstruction on my evaluation. CT is negative for acute abnormality.  On my examination there is decent amount of constipation.   MEDICATIONS ORDERED IN ED: Medications - No data to display   IMPRESSION / MDM / ASSESSMENT AND PLAN / ED COURSE  I reviewed the triage vital signs and the nursing notes.  Patient's presentation is most consistent with acute presentation with potential threat to life or bodily function.  Patient presents emergency department for 24 hours of abdominal pain.  Intermittent on the left side.  Patient's lab work today shows a reassuring CBC with a normal white blood cell count, overall reassuring chemistry with normal LFTs, normal lipase, urinalysis is normal.  CT scan of my evaluation does have some constipation but radiology is reading the CT is negative for acute abnormality.  Given the patient's reassuring workup I believe the patient safe for discharge home we will place the patient on MiraLAX have the patient follow-up with his doctor.  Patient agreeable to plan of care.  FINAL  CLINICAL IMPRESSION(S) / ED DIAGNOSES   Abdominal pain   Note:  This document was prepared using Dragon voice recognition software and may include unintentional dictation errors.   Dorothyann Drivers, MD 07/24/24 [phone]

## 2024-07-24 NOTE — ED Notes (Signed)
 CBC was redrawn and sent to lab. First was hemolyzed.

## 2024-07-24 NOTE — ED Triage Notes (Signed)
 Pt c/o LUQ and L flank pain x1 day.  Pain score 5/10.  Denies n/v/d.  Hx of diverticulitis.    Pt was sent by [facility] to rule out a perforation.

## 2024-07-29 DIAGNOSIS — I10 Essential (primary) hypertension: Secondary | ICD-10-CM | POA: Diagnosis not present

## 2024-07-29 DIAGNOSIS — Z23 Encounter for immunization: Secondary | ICD-10-CM | POA: Diagnosis not present

## 2024-08-02 DIAGNOSIS — Z961 Presence of intraocular lens: Secondary | ICD-10-CM | POA: Diagnosis not present

## 2024-08-02 DIAGNOSIS — H43813 Vitreous degeneration, bilateral: Secondary | ICD-10-CM | POA: Diagnosis not present

## 2024-08-02 DIAGNOSIS — H353132 Nonexudative age-related macular degeneration, bilateral, intermediate dry stage: Secondary | ICD-10-CM | POA: Diagnosis not present

## 2024-08-03 ENCOUNTER — Encounter: Payer: Self-pay | Admitting: Oncology

## 2024-09-08 ENCOUNTER — Encounter: Payer: Self-pay | Admitting: Urology

## 2024-09-08 ENCOUNTER — Ambulatory Visit: Admitting: Urology

## 2024-09-08 VITALS — BP 124/75 | HR 62 | Ht 71.0 in | Wt 184.0 lb

## 2024-09-08 DIAGNOSIS — D494 Neoplasm of unspecified behavior of bladder: Secondary | ICD-10-CM | POA: Diagnosis not present

## 2024-09-08 DIAGNOSIS — C679 Malignant neoplasm of bladder, unspecified: Secondary | ICD-10-CM | POA: Diagnosis not present

## 2024-09-08 LAB — URINALYSIS, COMPLETE
Bilirubin, UA: NEGATIVE
Glucose, UA: NEGATIVE
Ketones, UA: NEGATIVE
Nitrite, UA: NEGATIVE
Protein,UA: NEGATIVE
RBC, UA: NEGATIVE
Specific Gravity, UA: 1.02 (ref 1.005–1.030)
Urobilinogen, Ur: 0.2 mg/dL (ref 0.2–1.0)
pH, UA: 6 (ref 5.0–7.5)

## 2024-09-08 LAB — MICROSCOPIC EXAMINATION

## 2024-09-08 MED ORDER — SULFAMETHOXAZOLE-TRIMETHOPRIM 800-160 MG PO TABS
1.0000 | ORAL_TABLET | Freq: Once | ORAL | Status: AC
  Administered 2024-09-08: 1 via ORAL

## 2024-09-08 NOTE — Progress Notes (Signed)
 09/08/2024 1:00 PM   Thomas Palmer. 1941/09/27 [id number]  Referring provider: Sadie Manna, MD [address] [facility] [place],  [place] [address]  Chief Complaint  Patient presents with   Cysto    HPI: Thomas Palmer. is a 83 y.o. male with the above urologic history.  Surveillance cystoscopy today remarkable for a 5 mm papillary tumor posterior wall.  He is scheduled for TURBT with instillation of intravesical gemcitabine    PMH: Past Medical History:  Diagnosis Date   Actinic keratosis    Acute appendicitis    Arthritis    joints in hips and fingers; shoulders too (07/13/2013)   Chronic lower back pain    COPD (chronic obstructive pulmonary disease) (HCC)    GOLD stage 3   Depression    Diverticulosis of large intestine without hemorrhage    Erythrocytosis 01/01/2021   Gastric ulcer    H/O hiatal hernia    Hyperlipidemia    Hypertension    Hypothyroidism    IDA (iron deficiency anemia)    OP (osteoporosis)    Pneumonia 08/2011   Spinal stenosis of lumbar region with neurogenic claudication     Surgical History: Past Surgical History:  Procedure Laterality Date   ACHILLES TENDON REPAIR Left 2021   APPENDECTOMY     BACK SURGERY     kyphoplasty   BLADDER INSTILLATION N/A 09/16/2023   Procedure: BLADDER INSTILLATION OF GEMCITABINE ;  Surgeon: Twylla Thomas BROCKS, MD;  Location: [facility] ORS;  Service: Urology;  Laterality: N/A;   CARDIAC CATHETERIZATION  ~ 2009   COLONOSCOPY WITH PROPOFOL  N/A 05/03/2019   Procedure: COLONOSCOPY WITH PROPOFOL ;  Surgeon: Gaylyn Gladis PENNER, MD;  Location: [facility] ENDOSCOPY;  Service: Endoscopy;  Laterality: N/A;   FIXATION KYPHOPLASTY LUMBAR SPINE  ~ 2012   L1 (07/13/2013)   JOINT REPLACEMENT     LAPAROSCOPIC APPENDECTOMY N/A 02/22/2018   Procedure: APPENDECTOMY LAPAROSCOPIC;  Surgeon: Nicholaus Selinda Birmingham, MD;  Location: [facility] ORS;  Service: General;  Laterality: N/A;   LUMBAR  LAMINECTOMY/DECOMPRESSION MICRODISCECTOMY Bilateral 05/08/2023   Procedure: Lumbar three-four, Lumbar four-five LAMINOTOMY, FORAMINOTOMY;  Surgeon: Mavis Purchase, MD;  Location: [facility] OR;  Service: Neurosurgery;  Laterality: Bilateral;   TOTAL HIP ARTHROPLASTY Left 07/12/2013   Procedure: TOTAL HIP ARTHROPLASTY;  Surgeon: Marcey Raman, MD;  Location: [facility] OR;  Service: Orthopedics;  Laterality: Left;   TOTAL HIP ARTHROPLASTY Right 05/17/2002   TRANSURETHRAL RESECTION OF BLADDER TUMOR N/A 09/16/2023   Procedure: TRANSURETHRAL RESECTION OF BLADDER TUMOR (TURBT);  Surgeon: Twylla Thomas BROCKS, MD;  Location: [facility] ORS;  Service: Urology;  Laterality: N/A;    Home Medications:  Allergies as of 09/08/2024       Reactions   Iodine Nausea Only   Contrast Dye - causes severe nausea   Contrast Media [iodinated Contrast Media] Nausea And Vomiting        Medication List        Accurate as of September 08, 2024  1:00 PM. If you have any questions, ask your nurse or doctor.          B-12 PO Take 1 tablet by mouth daily.   finasteride  5 MG tablet Commonly known as: PROSCAR  Take 5 mg by mouth daily.   levothyroxine  200 MCG tablet Commonly known as: SYNTHROID  Take 200 mcg by mouth daily before breakfast. Take with 50 mcg to equal 250 mcg daily   levothyroxine  50 MCG tablet Commonly known as: SYNTHROID  Take 50 mcg by mouth daily before breakfast.  Take with 200 mcg to equal 250 mcg daily   losartan 25 MG tablet Commonly known as: COZAAR Take 25 mg by mouth daily.   polyethylene glycol powder 17 GM/SCOOP powder Commonly known as: GLYCOLAX/MIRALAX Take 17 g by mouth daily.   PreserVision AREDS 2 Caps Take 1 capsule by mouth 2 (two) times daily.   tadalafil 20 MG tablet Commonly known as: CIALIS Take 20 mg by mouth daily as needed for erectile dysfunction.   TESTOSTERONE  COMPOUNDING KIT TD Place 1 mL onto the skin daily. TESTOSTERONE  75MG /ML VERSATILE CR  4 clicks   Trelegy Ellipta  100-62.5-25 MCG/ACT Aepb Generic drug: Fluticasone -Umeclidin-Vilant Inhale 1 puff into the lungs daily.   VITAMIN D PO Take 1 tablet by mouth daily.        Allergies:  Allergies  Allergen Reactions   Iodine Nausea Only    Contrast Dye - causes severe nausea   Contrast Media [Iodinated Contrast Media] Nausea And Vomiting    Family History: Family History  Problem Relation Age of Onset   Skin cancer Father    Stomach cancer Maternal Grandmother     Social History:  reports that he quit smoking about 35 years ago. His smoking use included pipe. He has never been exposed to tobacco smoke. He quit smokeless tobacco use about 34 years ago.  His smokeless tobacco use included chew. He reports current alcohol  use of about 5.0 standard drinks of alcohol  per week. He reports that he does not use drugs.   Physical Exam: BP 124/75   Pulse 62   Ht 5' 11 (1.803 m)   Wt 184 lb (83.5 kg)   BMI 25.66 kg/m   Constitutional:  Alert, No acute distress. HEENT: [place] AT Respiratory: Normal respiratory effort, no increased work of breathing. Psychiatric: Normal mood and affect.   Assessment & Plan:    1. Bladder tumor Scheduled for TURBT; possible biopsy/fulguration.  Procedure has been discussed in detail as per today's cystoscopy note.   Thomas JAYSON Barba, MD  [facility] [address], [address] [place], [place] [address] [phone]

## 2024-09-08 NOTE — H&P (View-Only) (Signed)
 09/08/2024 1:00 PM   Thomas Palmer. 1941/09/27 [id number]  Referring provider: Sadie Manna, MD [address] [facility] [place],  [place] [address]  Chief Complaint  Patient presents with   Cysto    HPI: Thomas Villarin. is a 83 y.o. male with the above urologic history.  Surveillance cystoscopy today remarkable for a 5 mm papillary tumor posterior wall.  He is scheduled for TURBT with instillation of intravesical gemcitabine    PMH: Past Medical History:  Diagnosis Date   Actinic keratosis    Acute appendicitis    Arthritis    joints in hips and fingers; shoulders too (07/13/2013)   Chronic lower back pain    COPD (chronic obstructive pulmonary disease) (HCC)    GOLD stage 3   Depression    Diverticulosis of large intestine without hemorrhage    Erythrocytosis 01/01/2021   Gastric ulcer    H/O hiatal hernia    Hyperlipidemia    Hypertension    Hypothyroidism    IDA (iron deficiency anemia)    OP (osteoporosis)    Pneumonia 08/2011   Spinal stenosis of lumbar region with neurogenic claudication     Surgical History: Past Surgical History:  Procedure Laterality Date   ACHILLES TENDON REPAIR Left 2021   APPENDECTOMY     BACK SURGERY     kyphoplasty   BLADDER INSTILLATION N/A 09/16/2023   Procedure: BLADDER INSTILLATION OF GEMCITABINE ;  Surgeon: Twylla Glendia BROCKS, MD;  Location: [facility] ORS;  Service: Urology;  Laterality: N/A;   CARDIAC CATHETERIZATION  ~ 2009   COLONOSCOPY WITH PROPOFOL  N/A 05/03/2019   Procedure: COLONOSCOPY WITH PROPOFOL ;  Surgeon: Gaylyn Gladis PENNER, MD;  Location: [facility] ENDOSCOPY;  Service: Endoscopy;  Laterality: N/A;   FIXATION KYPHOPLASTY LUMBAR SPINE  ~ 2012   L1 (07/13/2013)   JOINT REPLACEMENT     LAPAROSCOPIC APPENDECTOMY N/A 02/22/2018   Procedure: APPENDECTOMY LAPAROSCOPIC;  Surgeon: Nicholaus Selinda Birmingham, MD;  Location: [facility] ORS;  Service: General;  Laterality: N/A;   LUMBAR  LAMINECTOMY/DECOMPRESSION MICRODISCECTOMY Bilateral 05/08/2023   Procedure: Lumbar three-four, Lumbar four-five LAMINOTOMY, FORAMINOTOMY;  Surgeon: Mavis Purchase, MD;  Location: [facility] OR;  Service: Neurosurgery;  Laterality: Bilateral;   TOTAL HIP ARTHROPLASTY Left 07/12/2013   Procedure: TOTAL HIP ARTHROPLASTY;  Surgeon: Marcey Raman, MD;  Location: [facility] OR;  Service: Orthopedics;  Laterality: Left;   TOTAL HIP ARTHROPLASTY Right 05/17/2002   TRANSURETHRAL RESECTION OF BLADDER TUMOR N/A 09/16/2023   Procedure: TRANSURETHRAL RESECTION OF BLADDER TUMOR (TURBT);  Surgeon: Twylla Glendia BROCKS, MD;  Location: [facility] ORS;  Service: Urology;  Laterality: N/A;    Home Medications:  Allergies as of 09/08/2024       Reactions   Iodine Nausea Only   Contrast Dye - causes severe nausea   Contrast Media [iodinated Contrast Media] Nausea And Vomiting        Medication List        Accurate as of September 08, 2024  1:00 PM. If you have any questions, ask your nurse or doctor.          B-12 PO Take 1 tablet by mouth daily.   finasteride  5 MG tablet Commonly known as: PROSCAR  Take 5 mg by mouth daily.   levothyroxine  200 MCG tablet Commonly known as: SYNTHROID  Take 200 mcg by mouth daily before breakfast. Take with 50 mcg to equal 250 mcg daily   levothyroxine  50 MCG tablet Commonly known as: SYNTHROID  Take 50 mcg by mouth daily before breakfast.  Take with 200 mcg to equal 250 mcg daily   losartan 25 MG tablet Commonly known as: COZAAR Take 25 mg by mouth daily.   polyethylene glycol powder 17 GM/SCOOP powder Commonly known as: GLYCOLAX/MIRALAX Take 17 g by mouth daily.   PreserVision AREDS 2 Caps Take 1 capsule by mouth 2 (two) times daily.   tadalafil 20 MG tablet Commonly known as: CIALIS Take 20 mg by mouth daily as needed for erectile dysfunction.   TESTOSTERONE  COMPOUNDING KIT TD Place 1 mL onto the skin daily. TESTOSTERONE  75MG /ML VERSATILE CR  4 clicks   Trelegy Ellipta  100-62.5-25 MCG/ACT Aepb Generic drug: Fluticasone -Umeclidin-Vilant Inhale 1 puff into the lungs daily.   VITAMIN D PO Take 1 tablet by mouth daily.        Allergies:  Allergies  Allergen Reactions   Iodine Nausea Only    Contrast Dye - causes severe nausea   Contrast Media [Iodinated Contrast Media] Nausea And Vomiting    Family History: Family History  Problem Relation Age of Onset   Skin cancer Father    Stomach cancer Maternal Grandmother     Social History:  reports that he quit smoking about 35 years ago. His smoking use included pipe. He has never been exposed to tobacco smoke. He quit smokeless tobacco use about 34 years ago.  His smokeless tobacco use included chew. He reports current alcohol  use of about 5.0 standard drinks of alcohol  per week. He reports that he does not use drugs.   Physical Exam: BP 124/75   Pulse 62   Ht 5' 11 (1.803 m)   Wt 184 lb (83.5 kg)   BMI 25.66 kg/m   Constitutional:  Alert, No acute distress. HEENT: [place] AT Respiratory: Normal respiratory effort, no increased work of breathing. Psychiatric: Normal mood and affect.   Assessment & Plan:    1. Bladder tumor Scheduled for TURBT; possible biopsy/fulguration.  Procedure has been discussed in detail as per today's cystoscopy note.   Glendia JAYSON Barba, MD  [facility] [address], [address] [place], [place] [address] [phone]

## 2024-09-08 NOTE — Progress Notes (Signed)
   09/08/24  CC:  Chief Complaint  Patient presents with   Cysto   Urologic history: Ta urothelial carcinoma the bladder-low-grade TURBT 09/16/2023 for multifocal bladder tumors largest measuring 3 cm anterior bladder neck.  All resected tumors were on a small stalk Postresection gemcitabine  Intermediate risk disease based on tumor volume; declined 6-week course intravesical chemotherapy  HPI: Presents for initial postresection surveillance cystoscopy and has no complaints.  Denies gross hematuria.  UA today 11-30 WBC/negative RBC/nitrite negative.  Patient asymptomatic  Refer to rooming tab for vitals NED. A&Ox3.     Cystoscopy Procedure Note  Patient identification was confirmed, informed consent was obtained, and patient was prepped using Betadine solution.  Lidocaine  jelly was administered per urethral meatus.     Pre-Procedure: - Inspection reveals a normal caliber urethral meatus.  Procedure: The flexible cystoscope was introduced without difficulty - No urethral strictures/lesions are present. -Prominent lateral lobe enlargement with hypervascularity prostate  -Mild elevation bladder neck - Bilateral ureteral orifices identified - Bladder mucosa  reveals ~5 mm papillary posterior wall bladder tumor.  No other mucosal abnormalities noted - No bladder stones -Mild trabeculation  Retroflexion shows no bladder neck tumors   Post-Procedure: - Patient tolerated the procedure well  Assessment/ Plan: Solitary posterior wall papillary tumor ~5 mm Findings were discussed with patient.  Recommend TURBT with postresection gemcitabine  The procedure was discussed in detail including potential risks of bleeding, bladder injury/perforation All questions were answered and he desires to proceed Bactrim DS given post procedure   Glendia JAYSON Barba, MD

## 2024-09-09 ENCOUNTER — Telehealth: Payer: Self-pay

## 2024-09-09 ENCOUNTER — Other Ambulatory Visit: Payer: Self-pay

## 2024-09-09 DIAGNOSIS — D494 Neoplasm of unspecified behavior of bladder: Secondary | ICD-10-CM

## 2024-09-09 NOTE — Progress Notes (Signed)
   [facility] Posting Form  Surgery Date: Date: 10/07/2024  Surgeon: Dr. Glendia Barba, MD  Inpt ( No  )   Outpt (Yes)   Obs ( No  )   Diagnosis: D49.4 Bladder Tumor  -CPT: 47765, [phone]  Surgery: Transurethral Resection of Bladder Tumor With Intravesical Instillation of Gemcitabine   Stop Anticoagulations: No  Cardiac/Medical/Pulmonary Clearance needed: no  *Orders entered into EPIC  Date: 09/09/24   *Case booked in [place]  Date: 09/09/2024  *Notified pt of Surgery: Date: 09/09/24  PRE-OP UA & CX: Yes, will obtain in clinic on 09/27/2024  *Placed into Prior Authorization Work [place] Date: 09/09/24  Assistant/laser/rep:No

## 2024-09-09 NOTE — Progress Notes (Signed)
 Surgical Physician Order Form [facility]  Dr. Glendia Barba, MD  * Scheduling expectation : Wants to schedule after Thanksgiving  *Length of Case: 30 minutes  *Clearance needed: no  *Anticoagulation Instructions: N/A  *Aspirin Instructions: N/A  *Post-op visit Date/Instructions:  1-2 week with pathology review  *Diagnosis: Bladder Tumor  *Procedure:  TURBT <2cm (47765)   Additional orders: Gemcitabine  2000mg  bladder instillation  -Admit type: OUTpatient  -Anesthesia: Choice  -VTE Prophylaxis Standing Order SCD's       Other:   -Standing Lab Orders Per Anesthesia    Lab other: UA&Urine Culture  -Standing Test orders EKG/Chest x-ray per Anesthesia       Test other:   - Medications:  Ancef  2gm IV  -Other orders:  N/A

## 2024-09-09 NOTE — Telephone Encounter (Signed)
 Per Dr. Twylla, Patient is to be scheduled for Transurethral Resection of Bladder Tumor With Intravesical Instillation of Gemcitabine    Mr. Skilling was contacted and possible surgical dates were discussed, Thursday December 11th, 2025 was agreed upon for surgery.   Patient was instructed that Dr. Twylla will require them to provide a pre-op UA & CX prior to surgery. This was ordered and scheduled drop off appointment was made for 09/27/2024.    Patient was directed to call [phone] between 1-3pm the day before surgery to find out surgical arrival time.  Instructions were given not to eat or drink from midnight on the night before surgery and have a driver for the day of surgery. On the surgery day patient was instructed to enter through the Medical Mall entrance of [facility] report the Same Day Surgery desk.   Pre-Admit Testing will be in contact via phone to set up an interview with the anesthesia team to review your history and medications prior to surgery.   Reminder of this information was sent via MyChart to the patient.

## 2024-09-27 ENCOUNTER — Other Ambulatory Visit

## 2024-09-27 DIAGNOSIS — D494 Neoplasm of unspecified behavior of bladder: Secondary | ICD-10-CM | POA: Diagnosis not present

## 2024-09-27 LAB — URINALYSIS, COMPLETE
Bilirubin, UA: NEGATIVE
Glucose, UA: NEGATIVE
Ketones, UA: NEGATIVE
Leukocytes,UA: NEGATIVE
Nitrite, UA: NEGATIVE
Protein,UA: NEGATIVE
RBC, UA: NEGATIVE
Specific Gravity, UA: 1.01 (ref 1.005–1.030)
Urobilinogen, Ur: 0.2 mg/dL (ref 0.2–1.0)
pH, UA: 6 (ref 5.0–7.5)

## 2024-09-27 LAB — MICROSCOPIC EXAMINATION
Bacteria, UA: NONE SEEN
Epithelial Cells (non renal): NONE SEEN /HPF (ref 0–10)
RBC, Urine: NONE SEEN /HPF (ref 0–2)

## 2024-09-28 ENCOUNTER — Other Ambulatory Visit: Payer: Self-pay

## 2024-09-28 ENCOUNTER — Inpatient Hospital Stay
Admission: RE | Admit: 2024-09-28 | Discharge: 2024-09-28 | Disposition: A | Source: Ambulatory Visit | Attending: Urology | Admitting: Urology

## 2024-09-28 VITALS — Ht 71.0 in | Wt 185.0 lb

## 2024-09-28 DIAGNOSIS — E871 Hypo-osmolality and hyponatremia: Secondary | ICD-10-CM

## 2024-09-28 DIAGNOSIS — D508 Other iron deficiency anemias: Secondary | ICD-10-CM

## 2024-09-28 DIAGNOSIS — I10 Essential (primary) hypertension: Secondary | ICD-10-CM

## 2024-09-28 HISTORY — DX: Malignant neoplasm of bladder, unspecified: C67.9

## 2024-09-28 NOTE — Patient Instructions (Addendum)
 Your procedure is scheduled on: Thursday 10/07/24 Report to the Registration Desk on the 1st floor of the Medical Mall. To find out your arrival time, please call [phone] between 1PM - 3PM on: Wednesday 10/06/24 If your arrival time is 6:00 am, do not arrive before that time as the Medical Mall entrance doors do not open until 6:00 am.  REMEMBER: Instructions that are not followed completely may result in serious medical risk, up to and including death; or upon the discretion of your surgeon and anesthesiologist your surgery may need to be rescheduled.  Do not eat food or drink any liquids after midnight the night before surgery.  No gum chewing or hard candies.  One week prior to surgery: Stop Anti-inflammatories (NSAIDS) such as Advil, Aleve, Ibuprofen, Motrin, Naproxen, Naprosyn and Aspirin based products such as Excedrin, Goody's Powder, BC Powder.  You may however, continue to take Tylenol  if needed for pain up until the day of surgery.  Stop ANY OVER THE COUNTER supplements and vitamins until after surgery.  Continue taking all of your other prescription medications up until the day of surgery.  ON THE DAY OF SURGERY ONLY TAKE THESE MEDICATIONS WITH SIPS OF WATER:  finasteride  (PROSCAR ) 5 MG tablet  levothyroxine  (SYNTHROID ) 250 MCG tablet  LITHIUM OROTATE PO   Use inhalers on the day of surgery .  No Alcohol  for 24 hours before or after surgery.  No Smoking including e-cigarettes for 24 hours before surgery.  No chewable tobacco products for at least 6 hours before surgery.  No nicotine patches on the day of surgery.  Do not use any recreational drugs for at least a week (preferably 2 weeks) before your surgery.  Please be advised that the combination of cocaine and anesthesia may have negative outcomes, up to and including death. If you test positive for cocaine, your surgery will be cancelled.  On the morning of surgery brush your teeth with toothpaste and  water, you may rinse your mouth with mouthwash if you wish. Do not swallow any toothpaste or mouthwash.  Shower prior to arrival to your procedure  Do not shave body hair from the neck down 48 hours before surgery.  Do not wear lotions, powders, or perfumes.   Wear comfortable clothing (specific to your surgery type) to the hospital.  Do not wear jewelry, make-up, hairpins, clips or nail polish.  For welded (permanent) jewelry: bracelets, anklets, waist bands, etc.  Please have this removed prior to surgery.  If it is not removed, there is a chance that hospital personnel will need to cut it off on the day of surgery.  Contact lenses, hearing aids and dentures may not be worn into surgery.  Do not bring valuables to the hospital. [facility] is not responsible for any missing/lost belongings or valuables.   Notify your doctor if there is any change in your medical condition (cold, fever, infection).  After surgery, you can help prevent lung complications by doing breathing exercises.  Take deep breaths and cough every 1-2 hours. Your doctor may order a device called an Incentive Spirometer to help you take deep breaths.  If you are being discharged the day of surgery, you will not be allowed to drive home. You will need a responsible individual to drive you home and stay with you for 24 hours after surgery.   Please call the Pre-admissions Testing Dept. at [phone] if you have any questions about these instructions.  Surgery Visitation Policy:  Patients having surgery  or a procedure may have two visitors.  Children under the age of 48 must have an adult with them who is not the patient.   Merchandiser, Retail to address health-related social needs:  [email or web address]

## 2024-09-29 ENCOUNTER — Inpatient Hospital Stay
Admission: RE | Admit: 2024-09-29 | Discharge: 2024-09-29 | Disposition: A | Source: Ambulatory Visit | Attending: Urology | Admitting: Urology

## 2024-09-29 ENCOUNTER — Encounter: Payer: Self-pay | Admitting: Oncology

## 2024-09-29 ENCOUNTER — Encounter: Payer: Self-pay | Admitting: Urgent Care

## 2024-09-29 DIAGNOSIS — E871 Hypo-osmolality and hyponatremia: Secondary | ICD-10-CM | POA: Diagnosis not present

## 2024-09-29 DIAGNOSIS — I10 Essential (primary) hypertension: Secondary | ICD-10-CM | POA: Diagnosis not present

## 2024-09-29 DIAGNOSIS — Z01818 Encounter for other preprocedural examination: Secondary | ICD-10-CM | POA: Diagnosis not present

## 2024-09-29 DIAGNOSIS — D508 Other iron deficiency anemias: Secondary | ICD-10-CM

## 2024-09-29 DIAGNOSIS — I440 Atrioventricular block, first degree: Secondary | ICD-10-CM | POA: Diagnosis not present

## 2024-09-29 DIAGNOSIS — I4510 Unspecified right bundle-branch block: Secondary | ICD-10-CM | POA: Diagnosis not present

## 2024-09-29 LAB — CBC
HCT: 51.5 % (ref 39.0–52.0)
Hemoglobin: 16.7 g/dL (ref 13.0–17.0)
MCH: 29.8 pg (ref 26.0–34.0)
MCHC: 32.4 g/dL (ref 30.0–36.0)
MCV: 91.8 fL (ref 80.0–100.0)
Platelets: 153 K/uL (ref 150–400)
RBC: 5.61 MIL/uL (ref 4.22–5.81)
RDW: 12.9 % (ref 11.5–15.5)
WBC: 4.5 K/uL (ref 4.0–10.5)
nRBC: 0 % (ref 0.0–0.2)

## 2024-09-29 LAB — BASIC METABOLIC PANEL WITH GFR
Anion gap: 6 (ref 5–15)
BUN: 15 mg/dL (ref 8–23)
CO2: 31 mmol/L (ref 22–32)
Calcium: 9.3 mg/dL (ref 8.9–10.3)
Chloride: 93 mmol/L — ABNORMAL LOW (ref 98–111)
Creatinine, Ser: 0.82 mg/dL (ref 0.61–1.24)
GFR, Estimated: >60 mL/min (ref >60)
Glucose, Bld: 78 mg/dL (ref 70–99)
Potassium: 4.5 mmol/L (ref 3.5–5.1)
Sodium: 131 mmol/L — ABNORMAL LOW (ref 135–145)

## 2024-10-01 LAB — CULTURE, URINE COMPREHENSIVE

## 2024-10-04 ENCOUNTER — Ambulatory Visit: Payer: Medicare HMO | Admitting: Dermatology

## 2024-10-04 ENCOUNTER — Encounter: Payer: Self-pay | Admitting: Dermatology

## 2024-10-04 DIAGNOSIS — D224 Melanocytic nevi of scalp and neck: Secondary | ICD-10-CM | POA: Diagnosis not present

## 2024-10-04 DIAGNOSIS — L820 Inflamed seborrheic keratosis: Secondary | ICD-10-CM | POA: Diagnosis not present

## 2024-10-04 DIAGNOSIS — D1801 Hemangioma of skin and subcutaneous tissue: Secondary | ICD-10-CM

## 2024-10-04 DIAGNOSIS — L720 Epidermal cyst: Secondary | ICD-10-CM | POA: Diagnosis not present

## 2024-10-04 DIAGNOSIS — D492 Neoplasm of unspecified behavior of bone, soft tissue, and skin: Secondary | ICD-10-CM | POA: Diagnosis not present

## 2024-10-04 DIAGNOSIS — Z1283 Encounter for screening for malignant neoplasm of skin: Secondary | ICD-10-CM | POA: Diagnosis not present

## 2024-10-04 DIAGNOSIS — L821 Other seborrheic keratosis: Secondary | ICD-10-CM

## 2024-10-04 DIAGNOSIS — D239 Other benign neoplasm of skin, unspecified: Secondary | ICD-10-CM

## 2024-10-04 DIAGNOSIS — C44222 Squamous cell carcinoma of skin of right ear and external auricular canal: Secondary | ICD-10-CM | POA: Diagnosis not present

## 2024-10-04 DIAGNOSIS — L578 Other skin changes due to chronic exposure to nonionizing radiation: Secondary | ICD-10-CM

## 2024-10-04 DIAGNOSIS — D229 Melanocytic nevi, unspecified: Secondary | ICD-10-CM

## 2024-10-04 DIAGNOSIS — L814 Other melanin hyperpigmentation: Secondary | ICD-10-CM | POA: Diagnosis not present

## 2024-10-04 DIAGNOSIS — W908XXA Exposure to other nonionizing radiation, initial encounter: Secondary | ICD-10-CM | POA: Diagnosis not present

## 2024-10-04 NOTE — Patient Instructions (Addendum)

## 2024-10-04 NOTE — Progress Notes (Signed)
 Follow-Up Visit   Subjective  Thomas Palmer is a 83 y.o. male who presents for the following: Skin Cancer Screening and Full Body Skin Exam, hx of Aks. No personal Hx of skin cancer.   Spot of concern on left angle of jaw. Asymptomatic. Raised, rough.  Spot inside right ear. Has been looked at before.   The patient presents for Total-Body Skin Exam (TBSE) for skin cancer screening and mole check. The patient has spots, moles and lesions to be evaluated, some may be new or changing and the patient may have concern these could be cancer.    The following portions of the chart were reviewed this encounter and updated as appropriate: medications, allergies, medical history  Review of Systems:  No other skin or systemic complaints except as noted in HPI or Assessment and Plan.  Objective  Well appearing patient in no apparent distress; mood and affect are within normal limits.  A full examination was performed including scalp, head, eyes, ears, nose, lips, neck, chest, axillae, abdomen, back, buttocks, bilateral upper extremities, bilateral lower extremities, hands, feet, fingers, toes, fingernails, and toenails. All findings within normal limits unless otherwise noted below.   Relevant physical exam findings are noted in the Assessment and Plan.  Right Intertragus 8 mm pink scaly macule with erosion   Left mid forearm 1 cm scaly pink scar-like patch  Left Inferior Occipital Scalp 5 mm pink papule with telangiectasias    Assessment & Plan   SKIN CANCER SCREENING PERFORMED TODAY.  ACTINIC DAMAGE - Chronic condition, secondary to cumulative UV/sun exposure - diffuse scaly erythematous macules with underlying dyspigmentation - Recommend daily broad spectrum sunscreen SPF 30+ to sun-exposed areas, reapply every 2 hours as needed.  - Staying in the shade or wearing long sleeves, sun glasses (UVA+UVB protection) and wide brim hats (4-inch brim around the entire circumference of the  hat) are also recommended for sun protection.  - Call for new or changing lesions.  LENTIGINES, SEBORRHEIC KERATOSES, HEMANGIOMAS - Benign normal skin lesions - Benign-appearing - Call for any changes  MELANOCYTIC NEVI - Tan-brown and/or pink-flesh-colored symmetric macules and papules - Benign appearing on exam today - Observation - Call clinic for new or changing moles - Recommend daily use of broad spectrum spf 30+ sunscreen to sun-exposed areas.   SEBORRHEIC KERATOSIS - Keratotic stuck on papule at right angle of jaw - Benign-appearing - Discussed benign etiology and prognosis. - Observe - Call for any changes  DERMATOFIBROMA Exam: Firm pink/brown papulenodule with dimple sign at R medial knee.  Treatment Plan: A dermatofibroma is a benign growth possibly related to trauma, such as an insect bite, cut from shaving, or inflamed acne-type bump.  Treatment options to remove include shave or excision with resulting scar and risk of recurrence.  Since benign-appearing and not bothersome, will observe for now.    NEOPLASM OF SKIN (3) Right Intertragus Skin / nail biopsy Type of biopsy: tangential   Informed consent: discussed and consent obtained   Timeout: patient name, date of birth, surgical site, and procedure verified   Procedure prep:  Patient was prepped and draped in usual sterile fashion Prep type:  Isopropyl alcohol  Anesthesia: the lesion was anesthetized in a standard fashion   Anesthetic:  1% lidocaine  w/ epinephrine  1-100,000 buffered w/ 8.4% NaHCO3 Instrument used: DermaBlade   Hemostasis achieved with: pressure and aluminum chloride   Outcome: patient tolerated procedure well   Post-procedure details: sterile dressing applied and wound care instructions given   Dressing  type: bandage and petrolatum    Specimen 1 - Surgical pathology Differential Diagnosis: AK vs SCC  Check Margins: No  2 pieces of specimen in bottle  Left mid forearm Skin / nail  biopsy Type of biopsy: tangential   Informed consent: discussed and consent obtained   Timeout: patient name, date of birth, surgical site, and procedure verified   Procedure prep:  Patient was prepped and draped in usual sterile fashion Prep type:  Isopropyl alcohol  Anesthesia: the lesion was anesthetized in a standard fashion   Anesthetic:  1% lidocaine  w/ epinephrine  1-100,000 buffered w/ 8.4% NaHCO3 Instrument used: DermaBlade   Hemostasis achieved with: pressure and aluminum chloride   Outcome: patient tolerated procedure well   Post-procedure details: sterile dressing applied and wound care instructions given   Dressing type: bandage and petrolatum    Specimen 2 - Surgical pathology Differential Diagnosis: AK vs BCC  Check Margins: No Left Inferior Occipital Scalp Skin / nail biopsy Type of biopsy: tangential   Informed consent: discussed and consent obtained   Timeout: patient name, date of birth, surgical site, and procedure verified   Procedure prep:  Patient was prepped and draped in usual sterile fashion Prep type:  Isopropyl alcohol  Anesthesia: the lesion was anesthetized in a standard fashion   Anesthetic:  1% lidocaine  w/ epinephrine  1-100,000 buffered w/ 8.4% NaHCO3 Instrument used: DermaBlade   Hemostasis achieved with: pressure and aluminum chloride   Outcome: patient tolerated procedure well   Post-procedure details: sterile dressing applied and wound care instructions given   Dressing type: bandage and petrolatum    Specimen 3 - Surgical pathology Differential Diagnosis: BCC vs epidermal inclusion cyst vs other follicular tumor  Check Margins: No MULTIPLE BENIGN NEVI   LENTIGINES   ACTINIC ELASTOSIS   CHERRY ANGIOMA   SEBORRHEIC KERATOSES   DERMATOFIBROMA    Return in about 6 months (around 04/04/2025) for TBSE.  I, Kate Fought, CMA, am acting as scribe for Boneta Sharps, MD.    Documentation: I have reviewed the above documentation  for accuracy and completeness, and I agree with the above.  Boneta Sharps, MD

## 2024-10-06 LAB — SURGICAL PATHOLOGY

## 2024-10-06 MED ORDER — CHLORHEXIDINE GLUCONATE 0.12 % MT SOLN
15.0000 mL | Freq: Once | OROMUCOSAL | Status: AC
  Administered 2024-10-07: 15 mL via OROMUCOSAL

## 2024-10-06 MED ORDER — LACTATED RINGERS IV SOLN: INTRAVENOUS | Status: DC

## 2024-10-06 MED ORDER — GEMCITABINE CHEMO FOR BLADDER INSTILLATION 2000 MG
2000.0000 mg | Freq: Once | INTRAVENOUS | Status: DC
  Filled 2024-10-06: qty 52.6

## 2024-10-06 MED ORDER — ORAL CARE MOUTH RINSE: 15.0000 mL | Freq: Once | OROMUCOSAL | Status: AC

## 2024-10-06 NOTE — Anesthesia Preprocedure Evaluation (Addendum)
 Anesthesia Evaluation  Patient identified by MRN, date of birth, ID band Patient awake    Reviewed: Allergy & Precautions, NPO status , Patient's Chart, lab work & pertinent test results  History of Anesthesia Complications Negative for: history of anesthetic complications  Airway Mallampati: III  TM Distance: <3 FB Neck ROM: full    Dental  (+) Chipped, Implants   Pulmonary shortness of breath and with exertion, COPD, former smoker   Pulmonary exam normal        Cardiovascular Exercise Tolerance: Good hypertension, (-) angina (-) Past MI and (-) DOE Normal cardiovascular exam+ dysrhythmias (RBBB)      Neuro/Psych  PSYCHIATRIC DISORDERS      negative neurological ROS     GI/Hepatic Neg liver ROS, PUD,GERD  Controlled,, history of diverticulitis    Endo/Other  Hypothyroidism    Renal/GU      Musculoskeletal   Abdominal Normal abdominal exam  (+)   Peds  Hematology negative hematology ROS (+)   Anesthesia Other Findings Past Medical History: No date: Acute appendicitis No date: Arthritis     Comment:  joints in hips and fingers; shoulders too (07/13/2013) No date: Chronic lower back pain No date: COPD (chronic obstructive pulmonary disease) (HCC)     Comment:  GOLD stage 3 No date: Depression No date: Diverticulosis of large intestine without hemorrhage 01/01/2021: Erythrocytosis No date: Gastric ulcer No date: H/O hiatal hernia No date: Hyperlipidemia No date: Hypertension No date: Hypothyroidism No date: IDA (iron deficiency anemia) No date: OP (osteoporosis) 08/2011: Pneumonia No date: Spinal stenosis of lumbar region with neurogenic claudication  Past Surgical History: 2021: ACHILLES TENDON REPAIR; Left No date: APPENDECTOMY No date: BACK SURGERY     Comment:  kyphoplasty ~ 2009: CARDIAC CATHETERIZATION 05/03/2019: COLONOSCOPY WITH PROPOFOL ; N/A     Comment:  Procedure: COLONOSCOPY WITH  PROPOFOL ;  Surgeon:               Gaylyn Gladis PENNER, MD;  Location: [facility] ENDOSCOPY;                Service: Endoscopy;  Laterality: N/A; ~ 2012: FIXATION KYPHOPLASTY LUMBAR SPINE     Comment:  L1 (07/13/2013) No date: JOINT REPLACEMENT 02/22/2018: LAPAROSCOPIC APPENDECTOMY; N/A     Comment:  Procedure: APPENDECTOMY LAPAROSCOPIC;  Surgeon: Nicholaus Selinda Birmingham, MD;  Location: [facility] ORS;  Service: General;                Laterality: N/A; 05/08/2023: LUMBAR LAMINECTOMY/DECOMPRESSION MICRODISCECTOMY; Bilateral     Comment:  Procedure: Lumbar three-four, Lumbar four-five               LAMINOTOMY, FORAMINOTOMY;  Surgeon: Mavis Purchase, MD;              Location: [facility] OR;  Service: Neurosurgery;  Laterality:               Bilateral; 07/12/2013: TOTAL HIP ARTHROPLASTY; Left     Comment:  Procedure: TOTAL HIP ARTHROPLASTY;  Surgeon: Marcey Raman, MD;  Location: [facility] OR;  Service: Orthopedics;                Laterality: Left; 05/17/2002: TOTAL HIP ARTHROPLASTY; Right  BMI    Body Mass Index: 27.20 kg/m      Reproductive/Obstetrics negative OB ROS  Anesthesia Physical Anesthesia Plan  ASA: 3  Anesthesia Plan: General   Post-op Pain Management: Celebrex  PO (pre-op)* and Tylenol  PO (pre-op)*   Induction: Intravenous  PONV Risk Score and Plan: Ondansetron , Dexamethasone , Midazolam  and Treatment may vary due to age or medical condition  Airway Management Planned: Oral ETT  Additional Equipment:   Intra-op Plan:   Post-operative Plan: Extubation in OR  Informed Consent: I have reviewed the patients History and Physical, chart, labs and discussed the procedure including the risks, benefits and alternatives for the proposed anesthesia with the patient or authorized representative who has indicated his/her understanding and acceptance.     Dental Advisory Given  Plan Discussed with: Anesthesiologist, CRNA  and Surgeon  Anesthesia Plan Comments: (Patient consented for risks of anesthesia including but not limited to:  - adverse reactions to medications - damage to eyes, teeth, lips or other oral mucosa - nerve damage due to positioning  - sore throat or hoarseness - Damage to heart, brain, nerves, lungs, other parts of body or loss of life  Patient voiced understanding and assent.)        Anesthesia Quick Evaluation

## 2024-10-07 ENCOUNTER — Encounter: Admission: RE | Disposition: A | Payer: Self-pay | Source: Ambulatory Visit | Attending: Urology

## 2024-10-07 ENCOUNTER — Encounter: Payer: Self-pay | Admitting: Urgent Care

## 2024-10-07 ENCOUNTER — Ambulatory Visit: Payer: Self-pay | Admitting: Dermatology

## 2024-10-07 ENCOUNTER — Encounter: Payer: Self-pay | Admitting: Urology

## 2024-10-07 ENCOUNTER — Encounter: Payer: Self-pay | Admitting: Dermatology

## 2024-10-07 ENCOUNTER — Ambulatory Visit: Payer: Self-pay | Admitting: Urgent Care

## 2024-10-07 ENCOUNTER — Ambulatory Visit: Admission: RE | Admit: 2024-10-07 | Discharge: 2024-10-07 | Disposition: A | Attending: Urology | Admitting: Urology

## 2024-10-07 ENCOUNTER — Other Ambulatory Visit: Payer: Self-pay

## 2024-10-07 DIAGNOSIS — C4492 Squamous cell carcinoma of skin, unspecified: Secondary | ICD-10-CM

## 2024-10-07 DIAGNOSIS — D494 Neoplasm of unspecified behavior of bladder: Secondary | ICD-10-CM

## 2024-10-07 DIAGNOSIS — C679 Malignant neoplasm of bladder, unspecified: Secondary | ICD-10-CM

## 2024-10-07 HISTORY — PX: BLADDER INSTILLATION: SHX6893

## 2024-10-07 HISTORY — PX: CYSTOSCOPY WITH BIOPSY: SHX5122

## 2024-10-07 SURGERY — TURBT (TRANSURETHRAL RESECTION OF BLADDER TUMOR)
Anesthesia: General | Site: Bladder

## 2024-10-07 MED ORDER — FENTANYL CITRATE (PF) 100 MCG/2ML IJ SOLN
INTRAMUSCULAR | Status: AC
  Filled 2024-10-07: qty 2

## 2024-10-07 MED ORDER — OXYCODONE HCL 5 MG/5ML PO SOLN: 5.0000 mg | Freq: Once | ORAL | Status: AC | PRN

## 2024-10-07 MED ORDER — LIDOCAINE HCL (CARDIAC) PF 100 MG/5ML IV SOSY
PREFILLED_SYRINGE | INTRAVENOUS | Status: DC | PRN
  Administered 2024-10-07: 100 mg via INTRAVENOUS

## 2024-10-07 MED ORDER — EPHEDRINE SULFATE-NACL 50-0.9 MG/10ML-% IV SOSY
PREFILLED_SYRINGE | INTRAVENOUS | Status: DC | PRN
  Administered 2024-10-07: 5 mg via INTRAVENOUS
  Administered 2024-10-07: 10 mg via INTRAVENOUS
  Administered 2024-10-07: 5 mg via INTRAVENOUS

## 2024-10-07 MED ORDER — FENTANYL CITRATE (PF) 100 MCG/2ML IJ SOLN
INTRAMUSCULAR | Status: DC | PRN
  Administered 2024-10-07 (×2): 25 ug via INTRAVENOUS

## 2024-10-07 MED ORDER — PROPOFOL 10 MG/ML IV BOLUS
INTRAVENOUS | Status: AC
  Filled 2024-10-07: qty 20

## 2024-10-07 MED ORDER — ONDANSETRON HCL 4 MG/2ML IJ SOLN
INTRAMUSCULAR | Status: DC | PRN
  Administered 2024-10-07: 4 mg via INTRAVENOUS

## 2024-10-07 MED ORDER — SODIUM CHLORIDE 0.9 % IR SOLN
Status: DC | PRN
  Administered 2024-10-07: 6000 mL

## 2024-10-07 MED ORDER — ACETAMINOPHEN 10 MG/ML IV SOLN: 1000.0000 mg | Freq: Once | INTRAVENOUS | Status: DC | PRN

## 2024-10-07 MED ORDER — OXYCODONE HCL 5 MG PO TABS
5.0000 mg | ORAL_TABLET | Freq: Once | ORAL | Status: AC | PRN
  Administered 2024-10-07: 5 mg via ORAL

## 2024-10-07 MED ORDER — FLUOROURACIL 5 % EX CREA: TOPICAL_CREAM | CUTANEOUS | 0 refills | Status: AC

## 2024-10-07 MED ORDER — CEFAZOLIN SODIUM-DEXTROSE 2-4 GM/100ML-% IV SOLN
2.0000 g | INTRAVENOUS | Status: AC
  Administered 2024-10-07: 2 g via INTRAVENOUS

## 2024-10-07 MED ORDER — EPHEDRINE 5 MG/ML INJ
INTRAVENOUS | Status: AC
  Filled 2024-10-07: qty 5

## 2024-10-07 MED ORDER — DEXAMETHASONE SOD PHOSPHATE PF 10 MG/ML IJ SOLN
INTRAMUSCULAR | Status: DC | PRN
  Administered 2024-10-07: 10 mg via INTRAVENOUS

## 2024-10-07 MED ORDER — ONDANSETRON HCL 4 MG/2ML IJ SOLN
INTRAMUSCULAR | Status: AC
  Filled 2024-10-07: qty 2

## 2024-10-07 MED ORDER — OXYCODONE HCL 5 MG PO TABS
ORAL_TABLET | ORAL | Status: AC
  Filled 2024-10-07: qty 1

## 2024-10-07 MED ORDER — CEFAZOLIN SODIUM-DEXTROSE 2-4 GM/100ML-% IV SOLN
INTRAVENOUS | Status: AC
  Filled 2024-10-07: qty 100

## 2024-10-07 MED ORDER — DROPERIDOL 2.5 MG/ML IJ SOLN: 0.6250 mg | Freq: Once | INTRAMUSCULAR | Status: DC | PRN

## 2024-10-07 MED ORDER — ACETAMINOPHEN 500 MG PO TABS
ORAL_TABLET | ORAL | Status: AC
  Filled 2024-10-07: qty 2

## 2024-10-07 MED ORDER — STERILE WATER FOR IRRIGATION IR SOLN
Status: DC | PRN
  Administered 2024-10-07: 1000 mL
  Administered 2024-10-07: 3000 mL

## 2024-10-07 MED ORDER — CHLORHEXIDINE GLUCONATE 0.12 % MT SOLN
OROMUCOSAL | Status: AC
  Filled 2024-10-07: qty 15

## 2024-10-07 MED ORDER — ACETAMINOPHEN 500 MG PO TABS
1000.0000 mg | ORAL_TABLET | Freq: Once | ORAL | Status: AC
  Administered 2024-10-07: 1000 mg via ORAL

## 2024-10-07 MED ORDER — CELECOXIB 200 MG PO CAPS
ORAL_CAPSULE | ORAL | Status: AC
  Filled 2024-10-07: qty 1

## 2024-10-07 MED ORDER — FENTANYL CITRATE (PF) 100 MCG/2ML IJ SOLN: 25.0000 ug | INTRAMUSCULAR | Status: DC | PRN

## 2024-10-07 MED ORDER — GEMCITABINE CHEMO FOR BLADDER INSTILLATION 2000 MG
INTRAVENOUS | Status: DC | PRN
  Administered 2024-10-07: 2000 mg via INTRAVESICAL

## 2024-10-07 MED ORDER — CELECOXIB 200 MG PO CAPS
200.0000 mg | ORAL_CAPSULE | Freq: Once | ORAL | Status: AC
  Administered 2024-10-07: 200 mg via ORAL

## 2024-10-07 MED ORDER — PROPOFOL 10 MG/ML IV BOLUS
INTRAVENOUS | Status: DC | PRN
  Administered 2024-10-07: 90 mg via INTRAVENOUS
  Administered 2024-10-07: 40 mg via INTRAVENOUS

## 2024-10-07 MED ORDER — LIDOCAINE HCL (PF) 2 % IJ SOLN
INTRAMUSCULAR | Status: AC
  Filled 2024-10-07: qty 5

## 2024-10-07 SURGICAL SUPPLY — 19 items
BAG DRAIN SIEMENS DORNER NS (MISCELLANEOUS) ×2 IMPLANT
BAG URINE DRAIN 2000ML AR STRL (UROLOGICAL SUPPLIES) ×2 IMPLANT
CATH FOL 2WAY LX 18X30 (CATHETERS) ×2 IMPLANT
DRAPE UTILITY 15X26 TOWEL STRL (DRAPES) ×2 IMPLANT
DRSG TELFA 3X4 N-ADH STERILE (GAUZE/BANDAGES/DRESSINGS) ×2 IMPLANT
ELECTRODE LOOP 22F BIPOLAR SML (ELECTROSURGICAL) IMPLANT
ELECTRODE REM PT RTRN 9FT ADLT (ELECTROSURGICAL) IMPLANT
GLOVE BIOGEL PI IND STRL 7.5 (GLOVE) ×2 IMPLANT
GOWN STRL REUS W/ TWL LRG LVL3 (GOWN DISPOSABLE) ×2 IMPLANT
GOWN STRL REUS W/TWL XL LVL4 (GOWN DISPOSABLE) ×2 IMPLANT
KIT TURNOVER CYSTO (KITS) ×2 IMPLANT
LOOP CUT BIPOLAR 24F LRG (ELECTROSURGICAL) IMPLANT
PACK CYSTO AR (MISCELLANEOUS) ×2 IMPLANT
SET IRRIG Y-TYPE CYSTO (SET/KITS/TRAYS/PACK) ×2 IMPLANT
SOL .9 NS 3000ML IRR UROMATIC (IV SOLUTION) ×4 IMPLANT
SOLN STERILE WATER 500 ML (IV SOLUTION) ×2 IMPLANT
SURGILUBE 2OZ TUBE FLIPTOP (MISCELLANEOUS) ×2 IMPLANT
SYRINGE TOOMEY IRRIG 70ML (MISCELLANEOUS) ×2 IMPLANT
WATER STERILE IRR 3000ML UROMA (IV SOLUTION) IMPLANT

## 2024-10-07 NOTE — Telephone Encounter (Signed)
-----   Message from Boneta Sharps, MD sent at 10/07/2024  8:43 AM EST ----- Diagnosis: 1. Skin, right intertragus :       MODERATELY DIFFERENTIATED SQUAMOUS CELL CARCINOMA        2. Skin, left mid forearm :       LICHENOID ACTINIC KERATOSIS        3. Skin, left inferior occipital scalp :       SURFACE OF EPIDERMOID CYST AND MELANOCYTIC NEVUS, INTRADERMAL TYPE, IRRITATED    Please call with diagnosis and refer to Mohs surgery.  R ear Explanation: This is a squamous cell skin cancer that has grown beyond the surface of the skin and is invading the second layer of the skin. It has the potential to spread beyond the skin and  threaten your health, so I recommend treating it.  Treatment: Given the location and type of skin cancer, I recommend Mohs surgery. Mohs surgery involves cutting out the skin cancer and then checking under the microscope to ensure the whole skin  cancer was removed. If any skin cancer remains, the surgeon will cut out more until it is fully removed. The cure rate is about 98-99%. Once the Mohs surgeon confirms the skin cancer is out, they  will discuss the options to repair or heal the area. You must take it easy for about two weeks after surgery (no lifting over 10-15 lbs, avoid activity to get your heart rate and blood pressure up).  It is done at another office outside of [place] ([place], [place], or [place]).  2. L forearm, inflamed precancer, recommend 4 weeks of healing then efudex BID until reaction (redness irritation tingling itching) then stop. Up to 5 weeks of use.  3. L scalp, benign cyst and benign mole. No treatment needed

## 2024-10-07 NOTE — Discharge Instructions (Signed)
 Transurethral Resection of Bladder Tumor (TURBT) or Bladder Biopsy   General instructions:     Your recent bladder surgery requires very little post hospital care but some definite precautions.  Despite the fact that no skin incisions were used, the area around the bladder incisions are raw and covered with scabs to promote healing and prevent bleeding. Certain precautions are needed to insure that the scabs are not disturbed over the next 2-4 weeks while the healing proceeds.  Because the raw surface inside your bladder and the irritating effects of urine you may expect frequency of urination and/or urgency (a stronger desire to urinate) and perhaps even getting up at night more often. This will usually resolve or improve slowly over the healing period. You may see some blood in your urine over the first 6 weeks. Do not be alarmed, even if the urine was clear for a while. Get off your feet and drink lots of fluids until clearing occurs. If you start to pass clots or don't improve call us .  Diet:  You may return to your normal diet immediately. Because of the raw surface of your bladder, alcohol , spicy foods, foods high in acid and drinks with caffeine may cause irritation or frequency and should be used in moderation. To keep your urine flowing freely and avoid constipation, drink plenty of fluids during the day (8-10 glasses). Tip: Avoid cranberry juice because it is very acidic.  Activity:  Your physical activity doesn't need to be restricted. However, if you are very active, you may see some blood in the urine. We suggest that you reduce your activity under the circumstances until the bleeding has stopped.  Bowels:  It is important to keep your bowels regular during the postoperative period. Straining with bowel movements can cause bleeding. A bowel movement every other day is reasonable. Use a mild laxative if needed, such as milk of magnesia 2-3 tablespoons, or 2 Dulcolax tablets. Call if  you continue to have problems. If you had been taking narcotics for pain, before, during or after your surgery, you may be constipated. Take a laxative if necessary.    Medication:  You should resume your pre-surgery medications unless told not to. In addition you may be given an antibiotic to prevent or treat infection. Antibiotics are not always necessary. All medication should be taken as prescribed until the bottles are finished unless you are having an unusual reaction to one of the drugs.   [facility] [address] [place], [place] [address] [phone]

## 2024-10-07 NOTE — Transfer of Care (Signed)
 Immediate Anesthesia Transfer of Care Note  Patient: Thomas Palmer  Procedure(s) Performed: INSTILLATION, BLADDER CYSTOSCOPY, WITH BIOPSY (Bladder)  Patient Location: PACU  Anesthesia Type:General  Level of Consciousness: drowsy  Airway & Oxygen Therapy: Patient Spontanous Breathing and Patient connected to face mask oxygen  Post-op Assessment: Report given to RN and Post -op Vital signs reviewed and stable  Post vital signs: Reviewed and stable  Last Vitals:  Vitals Value Taken Time  BP 150/70 10/07/24 09:05  Temp 36.4 C 10/07/24 09:05  Pulse 61 10/07/24 09:08  Resp 13 10/07/24 09:08  SpO2 100 % 10/07/24 09:08  Vitals shown include unfiled device data.  Last Pain:  Vitals:   10/07/24 0905  TempSrc:   PainSc: Asleep         Complications: No notable events documented.

## 2024-10-07 NOTE — Anesthesia Postprocedure Evaluation (Signed)
 Anesthesia Post Note  Patient: Thomas Palmer  Procedure(s) Performed: INSTILLATION, BLADDER CYSTOSCOPY, WITH BIOPSY (Bladder)  Patient location during evaluation: PACU Anesthesia Type: General Level of consciousness: awake and alert Pain management: pain level controlled Vital Signs Assessment: post-procedure vital signs reviewed and stable Respiratory status: spontaneous breathing, nonlabored ventilation and respiratory function stable Cardiovascular status: blood pressure returned to baseline and stable Postop Assessment: no apparent nausea or vomiting Anesthetic complications: no   No notable events documented.   Last Vitals:  Vitals:   10/07/24 1015 10/07/24 1029  BP: (!) 156/84 (!) 167/98  Pulse: 65 65  Resp: 19 16  Temp:  (!) 36.1 C  SpO2: 98% 97%    Last Pain:  Vitals:   10/07/24 1029  TempSrc: Temporal  PainSc: 0-No pain                 Camellia Merilee Louder

## 2024-10-07 NOTE — Interval H&P Note (Signed)
 History and Physical Interval Note:  10/07/2024 8:05 AM  Thomas Palmer  has presented today for surgery, with the diagnosis of Bladder Tumor.  The various methods of treatment have been discussed with the patient and family. After consideration of risks, benefits and other options for treatment, the patient has consented to  Procedures with comments: TURBT (TRANSURETHRAL RESECTION OF BLADDER TUMOR) (N/A) INSTILLATION, BLADDER (N/A) - W/ Gemcitabine  as a surgical intervention.  The patient's history has been reviewed, patient examined, no change in status, stable for surgery.  I have reviewed the patient's chart and labs.  Questions were answered to the patient's satisfaction.    CV:RRR Lungs: Clear   Laquincy Eastridge C Kashus Karlen

## 2024-10-07 NOTE — Op Note (Signed)
° °  Preoperative diagnosis:  Bladder tumor  Postoperative diagnosis:  Same  Procedure: Cystoscopy with bladder biopsies/fulguration Instillation intravesical gemcitabine   Surgeon: Glendia JAYSON Barba, MD  Anesthesia: General  Complications: None  Intraoperative findings:  Cystoscopy: Urethra normal Without strictures; prominent lateral lobe enlargement with hypervascularity; UOs normal-appearing bilaterally; moderate bladder trabeculation with cellules The following bladder tumors were identified: ~5 mm papillary tumor upper posterior wall ~3 mm elongated papillary tumor right posterior wall  EBL: Minimal  Specimens:  Tumor right posterior wall Tumor upper posterior wall  Indication: Thomas Palmer is a 83 y.o. male with a history of urothelial carcinoma the bladder.  Recent surveillance cystoscopy remarkable for 5 mm posterior wall tumor.  After reviewing the management options for treatment, he elected to proceed with the above surgical procedure(s). We have discussed the potential benefits and risks of the procedure, side effects of the proposed treatment, the likelihood of the patient achieving the goals of the procedure, and any potential problems that might occur during the procedure or recuperation. Informed consent has been obtained.  Description of procedure:  The patient was taken to the operating room and general anesthesia was induced.  The patient was placed in the dorsal lithotomy position, prepped and draped in the usual sterile fashion, and preoperative antibiotics were administered. A preoperative time-out was performed.   A 21 French cystoscope with 30 degree lens was duplicated, placed per urethra and advanced proximally into the bladder under direct vision with findings as described above.  Using rigid biopsy forceps the 2 tumors were removed in toto and sent separately.  Biopsy sites were fulgurated with a Bugbee electrode for hemostasis.  Repeat cystoscopy  showed no additional tumors or abnormal mucosal lesions.  Hemostasis was adequate.  The bladder was drained and the cystoscope removed.  An 9F Foley catheter was placed.  The balloon was inflated with 10 mL of sterile water.  Catheter was irrigated with return of pink-tinged effluent.  2000 mg of intravesical gemcitabine  was instilled to the bladder.  This was allowed to dwell in the PACU for 1 hour.  This was well-tolerated.  After 1 hour, the catheter was drained and removed.   Glendia JAYSON Barba, M.D.

## 2024-10-07 NOTE — Anesthesia Procedure Notes (Signed)
 Procedure Name: LMA Insertion Date/Time: 10/07/2024 8:30 AM  Performed by: Jackye Spanner, CRNAPre-anesthesia Checklist: Patient identified, Patient being monitored, Timeout performed, Emergency Drugs available and Suction available Patient Re-evaluated:Patient Re-evaluated prior to induction Oxygen Delivery Method: Circle system utilized Preoxygenation: Pre-oxygenation with 100% oxygen Induction Type: IV induction Ventilation: Mask ventilation without difficulty LMA: LMA inserted LMA Size: 4.0 Tube type: Oral Number of attempts: 1 Placement Confirmation: positive ETCO2 and breath sounds checked- equal and bilateral Tube secured with: Tape Dental Injury: Teeth and Oropharynx as per pre-operative assessment  Comments: Smooth atraumatic intubation, no complications noted.

## 2024-10-08 ENCOUNTER — Encounter: Payer: Self-pay | Admitting: Urology

## 2024-10-11 ENCOUNTER — Ambulatory Visit: Payer: Self-pay | Admitting: Urology

## 2024-10-11 LAB — SURGICAL PATHOLOGY

## 2024-10-15 ENCOUNTER — Encounter: Payer: Self-pay | Admitting: Urology

## 2024-10-15 ENCOUNTER — Ambulatory Visit: Admitting: Urology

## 2024-10-15 VITALS — BP 138/77 | HR 67 | Ht 71.0 in | Wt 185.0 lb

## 2024-10-15 DIAGNOSIS — C679 Malignant neoplasm of bladder, unspecified: Secondary | ICD-10-CM

## 2024-10-15 NOTE — Progress Notes (Signed)
 "  10/15/2024 7:57 AM   Thomas Palmer 1940/11/03 [id number]  Referring provider: Sadie Manna, MD [address] [facility] [place],  [place] [address]  Chief Complaint  Patient presents with   Results   Urologic history: Ta urothelial carcinoma the bladder-low-grade TURBT 09/16/2023 for multifocal bladder tumors largest measuring 3 cm anterior bladder neck.  All resected tumors were on a small stalk Postresection gemcitabine  Intermediate risk disease based on tumor volume; declined 6-week course intravesical chemotherapy   HPI: Thomas Palmer is a 83 y.o. male presents for a postop follow-up.  Bladder biopsy/fulguration 10/07/2024 with findings of a 5 mm papillary tumor of the posterior wall and a 3 mm elongated papillary tumor right posterior wall.  All abnormal tissue removed with cold biopsy Postbiopsy gemcitabine  instilled Path: LG papillary urothelial carcinoma No postop complaints and doing well   PMH: Past Medical History:  Diagnosis Date   Actinic keratosis    Acute appendicitis    Arthritis    joints in hips and fingers; shoulders too (07/13/2013)   Bladder cancer (HCC)    Chronic lower back pain    COPD (chronic obstructive pulmonary disease) (HCC)    GOLD stage 3   Depression    Diverticulosis of large intestine without hemorrhage    Erythrocytosis 01/01/2021   Gastric ulcer    H/O hiatal hernia    Hyperlipidemia    Hypertension    Hypothyroidism    IDA (iron deficiency anemia)    OP (osteoporosis)    Pneumonia 08/2011   SCC (squamous cell carcinoma) 10/04/2024   right intertragus, Mohs referral sent to Dr. Corey   Spinal stenosis of lumbar region with neurogenic claudication     Surgical History: Past Surgical History:  Procedure Laterality Date   ACHILLES TENDON REPAIR Left 2021   APPENDECTOMY     BACK SURGERY     kyphoplasty   BLADDER INSTILLATION N/A 09/16/2023   Procedure: BLADDER INSTILLATION OF GEMCITABINE ;   Surgeon: Twylla Glendia BROCKS, MD;  Location: [facility] ORS;  Service: Urology;  Laterality: N/A;   BLADDER INSTILLATION N/A 10/07/2024   Procedure: INSTILLATION, BLADDER;  Surgeon: Twylla Glendia BROCKS, MD;  Location: [facility];  Service: Urology;  Laterality: N/A;  W/ Gemcitabine    CARDIAC CATHETERIZATION  ~ 2009   COLONOSCOPY WITH PROPOFOL  N/A 05/03/2019   Procedure: COLONOSCOPY WITH PROPOFOL ;  Surgeon: Gaylyn Gladis PENNER, MD;  Location: [facility] ENDOSCOPY;  Service: Endoscopy;  Laterality: N/A;   CYSTOSCOPY WITH BIOPSY N/A 10/07/2024   Procedure: CYSTOSCOPY, WITH BIOPSY;  Surgeon: Twylla Glendia BROCKS, MD;  Location: [facility];  Service: Urology;  Laterality: N/A;  bladder biposy and fulgeration   FIXATION KYPHOPLASTY LUMBAR SPINE  ~ 2012   L1 (07/13/2013)   JOINT REPLACEMENT     LAPAROSCOPIC APPENDECTOMY N/A 02/22/2018   Procedure: APPENDECTOMY LAPAROSCOPIC;  Surgeon: Nicholaus Selinda Birmingham, MD;  Location: [facility];  Service: General;  Laterality: N/A;   LUMBAR LAMINECTOMY/DECOMPRESSION MICRODISCECTOMY Bilateral 05/08/2023   Procedure: Lumbar three-four, Lumbar four-five LAMINOTOMY, FORAMINOTOMY;  Surgeon: Mavis Purchase, MD;  Location: [facility] OR;  Service: Neurosurgery;  Laterality: Bilateral;   TOTAL HIP ARTHROPLASTY Left 07/12/2013   Procedure: TOTAL HIP ARTHROPLASTY;  Surgeon: Marcey Raman, MD;  [facility] OR;  Service: Orthopedics;  Laterality: Left;   TOTAL HIP ARTHROPLASTY Right 05/17/2002   TRANSURETHRAL RESECTION OF BLADDER TUMOR N/A 09/16/2023   Procedure: TRANSURETHRAL RESECTION OF BLADDER TUMOR (TURBT);  Surgeon: Twylla Glendia BROCKS, MD;  Location: [facility];  Service: Urology;  Laterality: N/A;  Home Medications:  Allergies as of 10/15/2024       Reactions   Contrast Media [iodinated Contrast Media] Nausea And Vomiting   Iodine Nausea Only   Contrast Dye - causes severe nausea        Medication List        Accurate as of October 15, 2024  7:57 AM. If you have any questions, ask your nurse or  doctor.          STOP taking these medications    polyethylene glycol powder 17 GM/SCOOP powder Commonly known as: GLYCOLAX /MIRALAX        TAKE these medications    Alpha-Lipoic Acid 300 MG Tabs Take 300 mg by mouth daily.   calcium citrate 950 (200 Ca) MG tablet Commonly known as: CALCITRATE - dosed in mg elemental calcium Take 200 mg of elemental calcium by mouth daily.   Cholecalciferol 125 MCG (5000 UT) Tabs Take 5,000 Units by mouth daily.   finasteride  5 MG tablet Commonly known as: PROSCAR  Take 5 mg by mouth daily.   fluorouracil  5 % cream Commonly known as: EFUDEX  Apply to aa arm twice daily until red and irritated up to 5 weeks.   levothyroxine  200 MCG tablet Commonly known as: SYNTHROID  Take 200 mcg by mouth daily before breakfast. Take with 50 mcg to equal 250 mcg daily   levothyroxine  50 MCG tablet Commonly known as: SYNTHROID  Take 50 mcg by mouth daily before breakfast. Take with 200 mcg to equal 250 mcg daily   LITHIUM OROTATE PO Take 240 mg by mouth daily.   losartan 50 MG tablet Commonly known as: COZAAR Take 50 mg by mouth daily.   PreserVision AREDS 2 Caps Take 1 capsule by mouth 2 (two) times daily.   CENTRUM SILVER 50+MEN PO Take 1 tablet by mouth daily.   QC TUMERIC COMPLEX PO Take 800 mg by mouth daily.   tadalafil 20 MG tablet Commonly known as: CIALIS Take 20 mg by mouth daily as needed for erectile dysfunction.   TESTOSTERONE  COMPOUNDING KIT TD Place 1 mL onto the skin daily. TESTOSTERONE  75MG /ML VERSATILE CR  4 clicks   TESTOSTERONE  IM Apply 1.5ml (6 clicks) once daily as directed.   Trelegy Ellipta 100-62.5-25 MCG/ACT Aepb Generic drug: Fluticasone -Umeclidin-Vilant Inhale 1 puff into the lungs daily.   vitamin C with rose hips 500 MG tablet Take 500 mg by mouth daily.   zinc  gluconate 50 MG tablet Take 50 mg by mouth daily.        Allergies: Allergies[1]  Family History: Family History  Problem Relation  Age of Onset   Skin cancer Father    Stomach cancer Maternal Grandmother     Social History:  reports that he quit smoking about 35 years ago. His smoking use included pipe. He has never been exposed to tobacco smoke. He quit smokeless tobacco use about 34 years ago.  His smokeless tobacco use included chew. He reports current alcohol  use of about 5.0 standard drinks of alcohol  per week. He reports that he does not use drugs.   Physical Exam: BP 138/77   Pulse 67   Ht 5' 11 (1.803 m)   Wt 185 lb (83.9 kg)   BMI 25.80 kg/m   Constitutional:  Alert, No acute distress. HEENT: [place] AT Respiratory: Normal respiratory effort, no increased work of breathing. Psychiatric: Normal mood and affect.    Assessment & Plan:    1.  Recurrent Ta LG urothelial carcinoma Intermediate risk based on multifocal tumor, recurrent  Discussed 6-week intravesical gemcitabine  which may decrease recurrent tumor He would like to pursue this course and referral was placed to oncology Schedule follow-up surveillance cystoscopy 4 months    Glendia JAYSON Barba, MD  [facility] [address] [place], [place] [address] [phone]    [1]  Allergies Allergen Reactions   Contrast Media [Iodinated Contrast Media] Nausea And Vomiting   Iodine Nausea Only    Contrast Dye - causes severe nausea   "

## 2024-10-18 ENCOUNTER — Telehealth: Payer: Self-pay

## 2024-10-18 NOTE — Telephone Encounter (Signed)
 Patient called regarding efudex  cream treatment for his left forearm. He had a question about how to use cream and when to stop. Went over instructions with patient. Patient will wait 4 weeks  until area is healed and start cream bid to left forearm until reaction then stop. Use up to 5 weeks.  Patient verbalized understanding and denied further questions.

## 2024-11-11 ENCOUNTER — Encounter: Payer: Self-pay | Admitting: Dermatology

## 2024-11-12 ENCOUNTER — Other Ambulatory Visit: Admitting: Urology

## 2024-11-15 ENCOUNTER — Inpatient Hospital Stay: Admitting: Oncology

## 2024-11-15 ENCOUNTER — Inpatient Hospital Stay: Attending: Oncology

## 2024-11-15 ENCOUNTER — Encounter: Payer: Self-pay | Admitting: Oncology

## 2024-11-15 VITALS — BP 115/67 | HR 58 | Temp 97.6°F | Resp 17 | Wt 194.4 lb

## 2024-11-15 DIAGNOSIS — C679 Malignant neoplasm of bladder, unspecified: Secondary | ICD-10-CM | POA: Insufficient documentation

## 2024-11-15 DIAGNOSIS — D751 Secondary polycythemia: Secondary | ICD-10-CM

## 2024-11-15 MED ORDER — ONDANSETRON HCL 8 MG PO TABS: 8.0000 mg | ORAL_TABLET | Freq: Three times a day (TID) | ORAL | 1 refills | Status: AC | PRN

## 2024-11-15 NOTE — Progress Notes (Signed)
 START OFF PATHWAY REGIMEN - Bladder   [id number]:Gemcitabine  Intravesical 2,000 mg D1 q7 Days x 6 Cycles:   A cycle is every 7 days:     Gemcitabine    **Always confirm dose/schedule in your pharmacy ordering system**  Clinician Citation:   Patient Characteristics: Post-Cystectomy without Neoadjuvant Therapy, M0 (Pathologic Staging), pT0-2, pN0, M0 Therapeutic Status: Post-Cystectomy without Neoadjuvant Therapy, M0 (Pathologic Staging) AJCC T Category: pTa AJCC N Category: pN0 AJCC M Category: cM0 AJCC 8 Stage Grouping: 0a Intent of Therapy: Curative Intent, Discussed with Patient

## 2024-11-15 NOTE — Assessment & Plan Note (Addendum)
 Recurrent low-grade Ta urothelial carcinoma, multifocal, early recurrence Urology recommend adjuvant intravesical gemcitabine . Patient potential side effects reviewed and discussed with patient.  He agrees with the treatment We will arrange chemotherapy class. In this appeared to start intravesical chemotherapy in 1 to 2 weeks.

## 2024-11-15 NOTE — Progress Notes (Signed)
 " Hematology/Oncology progress note [phone]    Patient Care Team: Sadie Manna, MD as PCP - General (Internal Medicine) Babara Call, MD as Consulting Physician (Oncology)  ASSESSMENT & PLAN:   Erythrocytosis Secondary erythrocytosis due to testosterone  use. Labs reviewed and discussed with patient Phlebotomy if hct is >50  Urothelial carcinoma of bladder (HCC) Recurrent low-grade Ta urothelial carcinoma, multifocal, early recurrence Urology recommend adjuvant intravesical gemcitabine . Patient potential side effects reviewed and discussed with patient.  He agrees with the treatment We will arrange chemotherapy class. In this appeared to start intravesical chemotherapy in 1 to 2 weeks.  No orders of the defined types were placed in this encounter.  Follow up  To start intravesical gemcitabine  in next 1 to 2 weeks. All questions were answered. The patient knows to call the clinic with any problems, questions or concerns.  Call Babara, MD, PhD [facility] Hematology Oncology 11/15/2024   CHIEF COMPLAINTS/REASON FOR VISIT:  Secondary erythrocytosis  HISTORY OF PRESENTING ILLNESS:  Thomas Palmer is a 84 y.o. male who was seen in consultation at the request of Sadie Manna, MD for evaluation of polycytosis/erythrocytosis Reviewed recent labs done at the primary care provider's office. 12/14/2020, hemoglobin 19.4, hematocrit 59.2, MCV 94.7, WBC 5.6, Chronic Onset, intermittent, duration since 2020. No aggravating or alleviating factors.   Associated signs or symptoms: Denies weight loss, fever, chills, fatigue, night sweats.   Context:  Smoking history: Former smoker. Testosterone  supplements: Patient is on testosterone  cream. History of blood clots: Denies Daytime somnolence: Denies Family history of polycythemia: Denies  He is on testosterone  replacement therapy-75 mg.  INTERVAL HISTORY Thomas Palmer is a 84 y.o. male who has above history  reviewed by me today presents for follow up visit for secondary erythrocytosis, low-grade urothelial carcinoma. Patient reports feeling well today Oncology History  Urothelial carcinoma of bladder (HCC)  11/15/2024 Initial Diagnosis   Urothelial carcinoma of bladder  09/16/2023, TA urothelial carcinoma of bladder-low-grade status post TURBT for multi focal bladder tumors.  Largest was 3 cm anterior bladder neck.  Patient received post resection gemcitabine .  Declined 6-week course of intravesical chemotherapy.  10/07/2024 he developed recurrence.   Cystoscopy findings Urethra normal Without strictures; prominent lateral lobe enlargement with hypervascularity; UOs normal-appearing bilaterally; moderate bladder trabeculation with cellules The following bladder tumors were identified: ~5 mm papillary tumor upper posterior wall ~3 mm elongated papillary tumor right posterior wall  Pathology showed 1. Bladder, transurethral resection, right posterior wall tumor :       - LOW-GRADE PAPILLARY UROTHELIAL CARCINOMA   2. Bladder, transurethral resection, upper posterior bladder wall tumor :       - LOW-GRADE PAPILLARY UROTHELIAL CARCINOMA     11/15/2024 Cancer Staging   Staging form: Urinary Bladder, AJCC 8th Edition - Clinical stage from 11/15/2024: Stage 0a (rcTa, rcN0, rcM0) - Signed by Babara Call, MD on 11/15/2024 Stage prefix: Recurrence    Patient present to discuss intravesical gemcitabine  treatments. He reports feeling well.  Denies any dysuria, urinary difficulty, urgency.  He endorses frequent urination.  Review of Systems  Constitutional:  Positive for fatigue. Negative for appetite change, chills, fever and unexpected weight change.  HENT:   Negative for hearing loss and voice change.   Eyes:  Negative for eye problems and icterus.  Respiratory:  Negative for chest tightness, cough and shortness of breath.   Cardiovascular:  Negative for chest pain and leg swelling.   Gastrointestinal:  Negative for abdominal distention and abdominal pain.  Endocrine: Negative for hot  flashes.  Genitourinary:  Positive for frequency. Negative for difficulty urinating and dysuria.   Musculoskeletal:  Negative for arthralgias.  Skin:  Negative for itching and rash.  Neurological:  Negative for light-headedness and numbness.  Hematological:  Negative for adenopathy. Does not bruise/bleed easily.  Psychiatric/Behavioral:  Negative for confusion.     MEDICAL HISTORY:  Past Medical History:  Diagnosis Date   Actinic keratosis    Acute appendicitis    Arthritis    joints in hips and fingers; shoulders too (07/13/2013)   Bladder cancer (HCC)    Chronic lower back pain    COPD (chronic obstructive pulmonary disease) (HCC)    GOLD stage 3   Depression    Diverticulosis of large intestine without hemorrhage    Erythrocytosis 01/01/2021   Gastric ulcer    H/O hiatal hernia    Hyperlipidemia    Hypertension    Hypothyroidism    IDA (iron deficiency anemia)    OP (osteoporosis)    Pneumonia 08/2011   SCC (squamous cell carcinoma) 10/04/2024   right intertragus, Mohs referral sent to Dr. Corey   Spinal stenosis of lumbar region with neurogenic claudication     SURGICAL HISTORY: Past Surgical History:  Procedure Laterality Date   ACHILLES TENDON REPAIR Left 2021   APPENDECTOMY     BACK SURGERY     kyphoplasty   BLADDER INSTILLATION N/A 09/16/2023   Procedure: BLADDER INSTILLATION OF GEMCITABINE ;  Surgeon: Twylla Glendia BROCKS, MD;  Location: [facility] OR[facility];  Service: Urology;  Laterality: N/A;   BLADDER INSTILLATION N/A 10/07/2024   Procedure: INSTILLATION, BLADDER;  Surgeon: Twylla Glendia BROCKS, MD;  Location: [facility] OR[facility];  Service: Urology;  Laterality: N/A;  W/ Gemcitabine    CARDIAC CATHETERIZATION  ~ 2009   COLONOSCOPY WITH PROPOFOL  N/A 05/03/2019   Procedure: COLONOSCOPY WITH PROPOFOL ;  Surgeon: Gaylyn Gladis PENNER, MD;  Location: [facility] ENDOSCOPY;  Service: Endoscopy;   Laterality: N/A;   CYSTOSCOPY WITH BIOPSY N/A 10/07/2024   Procedure: CYSTOSCOPY, WITH BIOPSY;  Surgeon: Twylla Glendia BROCKS, MD;  Location: [facility];  Service: Urology;  Laterality: N/A;  bladder biposy and fulgeration   FIXATION KYPHOPLASTY LUMBAR SPINE  ~ 2012   L1 (07/13/2013)   JOINT REPLACEMENT     LAPAROSCOPIC APPENDECTOMY N/A 02/22/2018   Procedure: APPENDECTOMY LAPAROSCOPIC;  Surgeon: Nicholaus Selinda Birmingham, MD;  Location: [facility] ORS;  Service: General;  Laterality: N/A;   LUMBAR LAMINECTOMY/DECOMPRESSION MICRODISCECTOMY Bilateral 05/08/2023   Procedure: Lumbar three-four, Lumbar four-five LAMINOTOMY, FORAMINOTOMY;  Surgeon: Mavis Purchase, MD;  Location: [facility] OR;  Service: Neurosurgery;  Laterality: Bilateral;   TOTAL HIP ARTHROPLASTY Left 07/12/2013   Procedure: TOTAL HIP ARTHROPLASTY;  Surgeon: Marcey Raman, MD;  Location: [facility] OR;  Service: Orthopedics;  Laterality: Left;   TOTAL HIP ARTHROPLASTY Right 05/17/2002   TRANSURETHRAL RESECTION OF BLADDER TUMOR N/A 09/16/2023   Procedure: TRANSURETHRAL RESECTION OF BLADDER TUMOR (TURBT);  Surgeon: Twylla Glendia BROCKS, MD;  Location: [facility] OR[facility];  Service: Urology;  Laterality: N/A;    SOCIAL HISTORY: Social History   Socioeconomic History   Marital status: Married    Spouse name: brenda   Number of children: Not on file   Years of education: Not on file   Highest education level: Not on file  Occupational History   Not on file  Tobacco Use   Smoking status: Former    Types: Pipe    Quit date: 07/07/1989    Years since quitting: 35.3    Passive exposure: Never   Smokeless tobacco: Former  Types: [place]    Quit date: 10/28/1989  Vaping Use   Vaping status: Never Used  Substance and Sexual Activity   Alcohol  use: Yes    Alcohol /week: 5.0 standard drinks of alcohol     Types: 5 Cans of beer per week   Drug use: No   Sexual activity: Yes  Other Topics Concern   Not on file  Social History Narrative   Not on file   Social Drivers of  Health   Tobacco Use: Medium Risk (11/15/2024)   Patient History    Smoking Tobacco Use: Former    Smokeless Tobacco Use: Former    Passive Exposure: Never  Programmer, Applications: Low Risk  (07/22/2024)   Received from [facility]   Overall Financial Resource Strain (CARDIA)    Difficulty of Paying Living Expenses: Not hard at all  Food Insecurity: No Food Insecurity (07/22/2024)   Received from [facility]   Epic    Within the past 12 months, you worried that your food would run out before you got the money to buy more.: Never true    Within the past 12 months, the food you bought just didn't last and you didn't have money to get more.: Never true  Transportation Needs: No Transportation Needs (07/22/2024)   Received from [facility] - Transportation    In the past 12 months, has lack of transportation kept you from medical appointments or from getting medications?: No    Lack of Transportation (Non-Medical): No  Physical Activity: Not on file  Stress: Not on file  Social Connections: Not on file  Intimate Partner Violence: Not on file  Depression (PHQ2-9): Low Risk (07/22/2024)   Depression (PHQ2-9)    PHQ-2 Score: 0  Alcohol  Screen: Not on file  Housing: Low Risk  (07/22/2024)   Received from [facility]   Epic    In the last 12 months, was there a time when you were not able to pay the mortgage or rent on time?: No    In the past 12 months, how many times have you moved where you were living?: 0    At any time in the past 12 months, were you homeless or living in a shelter (including now)?: No  Utilities: Not At Risk (07/22/2024)   Received from [facility]   Epic    In the past 12 months has the electric, gas, oil, or water  company threatened to shut off services in your home?: No  Health Literacy: Not on file    FAMILY HISTORY: Family History  Problem Relation Age of  Onset   Skin cancer Father    Stomach cancer Maternal Grandmother     ALLERGIES:  is allergic to contrast media [iodinated contrast media] and iodine.  MEDICATIONS:  Current Outpatient Medications  Medication Sig Dispense Refill   Alpha-Lipoic Acid 300 MG TABS Take 300 mg by mouth daily.     Ascorbic Acid (VITAMIN C WITH ROSE HIPS) 500 MG tablet Take 500 mg by mouth daily.     calcium citrate (CALCITRATE - DOSED IN MG ELEMENTAL CALCIUM) 950 (200 Ca) MG tablet Take 200 mg of elemental calcium by mouth daily.     Cholecalciferol 125 MCG (5000 UT) TABS Take 5,000 Units by mouth daily.     finasteride  (PROSCAR ) 5 MG tablet Take 5 mg by mouth daily.     fluorouracil  (EFUDEX ) 5 % cream Apply to aa  arm twice daily until red and irritated up to 5 weeks. 40 g 0   Fluticasone -Umeclidin-Vilant (TRELEGY ELLIPTA) 100-62.5-25 MCG/ACT AEPB Inhale 1 puff into the lungs daily.     levothyroxine  (SYNTHROID ) 50 MCG tablet Take 50 mcg by mouth daily before breakfast. Take with 200 mcg to equal 250 mcg daily     levothyroxine  (SYNTHROID , LEVOTHROID) 200 MCG tablet Take 200 mcg by mouth daily before breakfast. Take with 50 mcg to equal 250 mcg daily     LITHIUM OROTATE PO Take 240 mg by mouth daily.     losartan (COZAAR) 50 MG tablet Take 50 mg by mouth daily.     Multiple Vitamins-Minerals (CENTRUM SILVER 50+MEN PO) Take 1 tablet by mouth daily.     Multiple Vitamins-Minerals (PRESERVISION AREDS 2) CAPS Take 1 capsule by mouth 2 (two) times daily.     tadalafil (CIALIS) 20 MG tablet Take 20 mg by mouth daily as needed for erectile dysfunction.     TESTOSTERONE  COMPOUNDING KIT TD Place 1 mL onto the skin daily. TESTOSTERONE  75MG /ML VERSATILE CR  4 clicks     TESTOSTERONE  IM Apply 1.5ml (6 clicks) once daily as directed.     Turmeric (QC TUMERIC COMPLEX PO) Take 800 mg by mouth daily.     zinc  gluconate 50 MG tablet Take 50 mg by mouth daily.     No current facility-administered medications for this visit.      PHYSICAL EXAMINATION: ECOG PERFORMANCE STATUS: 1 - Symptomatic but completely ambulatory Vitals:   11/15/24 1109  BP: 115/67  Pulse: (!) 58  Resp: 17  Temp: 97.6 F (36.4 C)  SpO2: 95%   Filed Weights   11/15/24 1109  Weight: 194 lb 6.4 oz (88.2 kg)    Physical Exam Constitutional:      General: He is not in acute distress. HENT:     Head: Normocephalic and atraumatic.  Eyes:     General: No scleral icterus. Cardiovascular:     Rate and Rhythm: Normal rate and regular rhythm.  Pulmonary:     Effort: Pulmonary effort is normal. No respiratory distress.  Abdominal:     General: There is no distension.  Musculoskeletal:        General: No deformity. Normal range of motion.     Cervical back: Normal range of motion and neck supple.  Skin:    General: Skin is warm.     Findings: No erythema or rash.  Neurological:     Mental Status: He is alert and oriented to person, place, and time. Mental status is at baseline.  Psychiatric:        Mood and Affect: Mood normal.     RADIOGRAPHIC STUDIES: I have personally reviewed the radiological images as listed and agreed with the findings in the report. No results found.   LABORATORY DATA:  I have reviewed the data as listed    Latest Ref Rng & Units 09/29/2024    8:46 AM 07/24/2024    1:47 PM 07/22/2024    1:28 PM  CBC  WBC 4.0 - 10.5 K/uL 4.5  7.7  6.9   Hemoglobin 13.0 - 17.0 g/dL 83.2  82.7  83.1   Hematocrit 39.0 - 52.0 % 51.5  51.3  51.1   Platelets 150 - 400 K/uL 153  138  140     "

## 2024-11-15 NOTE — Assessment & Plan Note (Addendum)
 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 Labs reviewed and discussed with patient Phlebotomy if hct is >50

## 2024-11-16 ENCOUNTER — Encounter: Payer: Self-pay | Admitting: Dermatology

## 2024-11-16 ENCOUNTER — Ambulatory Visit: Admitting: Dermatology

## 2024-11-16 ENCOUNTER — Encounter: Payer: Self-pay | Admitting: Oncology

## 2024-11-16 ENCOUNTER — Telehealth: Payer: Self-pay | Admitting: Oncology

## 2024-11-16 ENCOUNTER — Other Ambulatory Visit: Payer: Self-pay

## 2024-11-16 VITALS — BP 120/65 | HR 68 | Temp 98.2°F

## 2024-11-16 DIAGNOSIS — L814 Other melanin hyperpigmentation: Secondary | ICD-10-CM | POA: Diagnosis not present

## 2024-11-16 DIAGNOSIS — C44222 Squamous cell carcinoma of skin of right ear and external auricular canal: Secondary | ICD-10-CM

## 2024-11-16 DIAGNOSIS — L578 Other skin changes due to chronic exposure to nonionizing radiation: Secondary | ICD-10-CM | POA: Diagnosis not present

## 2024-11-16 DIAGNOSIS — C4492 Squamous cell carcinoma of skin, unspecified: Secondary | ICD-10-CM

## 2024-11-16 MED ORDER — GENTAMICIN SULFATE 0.1 % EX OINT: 1.0000 | TOPICAL_OINTMENT | Freq: Three times a day (TID) | CUTANEOUS | 0 refills | Status: AC

## 2024-11-16 MED ORDER — MUPIROCIN 2 % EX OINT: 1.0000 | TOPICAL_OINTMENT | Freq: Two times a day (BID) | CUTANEOUS | 1 refills | Status: AC

## 2024-11-16 NOTE — Progress Notes (Signed)
 "  Follow-Up Visit   Subjective  Thomas Palmer is a 84 y.o. male who presents for the following: Mohs of a Moderately Differentiated Squamous Cell Carcinoma on the right intertragus, referred by Dr. Claudene.  The following portions of the chart were reviewed this encounter and updated as appropriate: medications, allergies, medical history  Review of Systems:  No other skin or systemic complaints except as noted in HPI or Assessment and Plan.  Objective  Well appearing patient in no apparent distress; mood and affect are within normal limits.  A focused examination was performed of the following areas: Right intertragus Relevant physical exam findings are noted in the Assessment and Plan.   Right intertragus Tender pink nodule   Assessment & Plan   SQUAMOUS CELL CARCINOMA OF SKIN Right intertragus - Mohs surgery  Consent obtained: written  Universal Protocol: Procedure explained and questions answered to patient or proxy's satisfaction: Yes   Test results available and properly labeled: Yes   Pathology report reviewed: Yes   External notes reviewed: Yes   Photo or diagram used for site identification: Yes   Site/side marked: Yes   Slide independently reviewed by Mohs surgeon: Yes    Anticoagulation: Is the patient taking prescription anticoagulant and/or aspirin prescribed/recommended by a physician? No   Was the anticoagulation regimen changed prior to Mohs? No    Anesthesia: Anesthesia method: local infiltration Local anesthetic: lidocaine  1% WITH epi  Procedure Details: Timeout: pre-procedure verification complete Procedure Prep: patient was prepped and draped in usual sterile fashion Prep type: chlorhexidine  Biopsy accession number: [id number] Pre-Op diagnosis: squamous cell carcinoma SCC subtype: moderately differentiated MohsAIQ Surgical site (if tumor spans multiple areas, please select predominant area): ear Surgery side: right Surgical site (from  skin exam): Right intertragus Pre-operative length (cm): 1.3 Pre-operative width (cm): 1 Indications for Mohs surgery: anatomic location where tissue conservation is critical Previously treated? No    Micrographic Surgery Details: Post-operative length (cm): 2.6 Post-operative width (cm): 1.5 Number of Mohs stages: 2 Post surgery depth of defect: cartilage  Stage 1    Tumor features identified on Mohs section: squamous cell carcinoma  Stage 2    Tumor features identified on Mohs section: no tumor identified    Depth of tumor invasion after stage: cartilage  Patient tolerance of procedure: tolerated well, no immediate complications  Reconstruction: Was the defect reconstructed?: No    Opioids: Did the patient receive a prescription for opioid/narcotic related to Mohs surgery?: No    Antibiotics: Does patient meet AHA guidelines for endocarditis?: No   Does patient meet AHA guidelines for orthopedic prophylaxis?: No   Were antibiotics given on the day of surgery?: No   Did surgery breach mucosa, expose cartilage/bone, involve an area of lymphedema/inflamed/infected tissue? No      Return in about 4 weeks (around 12/14/2024) for wound check.  LILLETTE Darice Smock, CMA, am acting as scribe for RUFUS CHRISTELLA HOLY, MD.    11/16/2024  HISTORY OF PRESENT ILLNESS  Thomas Palmer is seen in consultation at the request of Dr. Claudene for biopsy-proven Moderately Differentiated Squamous Cell Carcinoma on the right intertragus. They note that the area has been present for about 1 y ear increasing in size with time.  There is no history of previous treatment.  Reports no other new or changing lesions and has no other complaints today.  Medications and allergies: see patient chart.  Review of systems: Reviewed 8 systems and notable for the above skin cancer.  All other systems reviewed  are unremarkable/negative, unless noted in the HPI. Past medical history, surgical history, family history,  social history were also reviewed and are noted in the chart/questionnaire.    PHYSICAL EXAMINATION  General: Well-appearing, in no acute distress, alert and oriented x 4. Vitals reviewed in chart (if available).   Skin: Exam reveals a 1.3 x 1.0 cm erythematous papule and biopsy scar on the right intertragus. There are rhytids, telangiectasias, and lentigines, consistent with photodamage.  Biopsy report(s) reviewed, confirming the diagnosis.   ASSESSMENT  1) Moderately Differentiated Squamous Cell Carcinoma on the right intertragus 2) photodamage 3) solar lentigines   PLAN   1. Due to location, size, histology, or recurrence and the likelihood of subclinical extension as well as the need to conserve normal surrounding tissue, the patient was deemed acceptable for Mohs micrographic surgery (MMS).  The nature and purpose of the procedure, associated benefits and risks including recurrence and scarring, possible complications such as pain, infection, and bleeding, and alternative methods of treatment if appropriate were discussed with the patient during consent. The lesion location was verified by the patient, by reviewing previous notes, pathology reports, and by photographs as well as angulation measurements if available.  Informed consent was reviewed and signed by the patient, and timeout was performed at 8:30 AM. See op note below.  2. For the photodamage and solar lentigines, sun protection discussed/information given on OTC sunscreens, and we recommend continued regular follow-up with primary dermatologist every 6 months or sooner for any growing, bleeding, or changing lesions. 3. Prognosis and future surveillance discussed. 4. Letter with treatment outcome sent to referring provider. 5. Pain acetaminophen /ibuprofen  MOHS MICROGRAPHIC SURGERY AND RECONSTRUCTION  Initial size:   1.3 x 1.0 cm Surgical defect/wound size: 2.6 x 1.5 cm Anesthesia:    0.33% lidocaine  with 1:200,000  epinephrine  EBL:    <5 mL Complications:  None Repair type:   Second Intention  Stages: 2  STAGE I: Anesthesia achieved with 0.5% lidocaine  with 1:200,000 epinephrine . ChloraPrep applied. 1 section(s) excised using Mohs technique (this includes total peripheral and deep tissue margin excision and evaluation with frozen sections, excised and interpreted by the same physician). The tumor was first debulked and then excised with an approx. 2 mm margin.  Hemostasis was achieved with electrocautery as needed.  The specimen was then oriented, subdivided/relaxed, inked, and processed using Mohs technique.    Frozen section analysis revealed a positive margin for full thickness epidermal architectural and cellular atypia, apoptotic cells, individual cell dyskeratosis with markedly altered maturation but usually still some surface keratinization and intercellular bridges present with marked nuclear atypia, including nuclear hyperchromasia and multinucleation in the peripheral margin.    STAGE II: An additional 2 mm margin was excised.  Hemostasis was achieved with electrocautery as needed.  The specimen was then oriented, subdivided/relaxed, inked, and processed using Mohs technique. Evaluation of slides by the Mohs surgeon revealed clear tumor margins.  Reconstruction  Patient was notified of results and repair options were discussed, including second intention healing. After reviewing the advantages and disadvantages of each, we agreed on second intention healing as appropriate.   The surgical site was then lightly scrubbed with sterile, saline-soaked gauze.  The area was bandaged using Vaseline ointment, non-adherent gauze, gauze pads, and tape to provide an adequate pressure dressing.   The patient tolerated the procedure well, was given detailed written and verbal wound care instructions, and was discharged in good condition.  The patient will follow-up in 4 weeks and as scheduled with primary  dermatologist.    Documentation: I have reviewed the above documentation for accuracy and completeness, and I agree with the above.  RUFUS CHRISTELLA HOLY, MD  "

## 2024-11-16 NOTE — Patient Instructions (Signed)

## 2024-11-16 NOTE — Telephone Encounter (Signed)
 Left VM with new treatment start date. Asked to return call to confirm scheduled appts for 1/29.

## 2024-11-18 ENCOUNTER — Inpatient Hospital Stay

## 2024-11-18 NOTE — Progress Notes (Signed)
 Pharmacist Chemotherapy Monitoring - Initial Assessment    Anticipated start date: 11/25/24   The following has been reviewed per standard work regarding the patient's treatment regimen: The patient's diagnosis, treatment plan and drug doses, and organ/hematologic function Lab orders and baseline tests specific to treatment regimen  The treatment plan start date, drug sequencing, and pre-medications Prior authorization status  Patient's documented medication list, including drug-drug interaction screen and prescriptions for anti-emetics and supportive care specific to the treatment regimen The drug concentrations, fluid compatibility, administration routes, and timing of the medications to be used The patient's access for treatment and lifetime cumulative dose history, if applicable  The patient's medication allergies and previous infusion related reactions, if applicable  Recurrent low-grade Ta urothelial carcinoma, multifocal, early recurrence Urology recommend adjuvant intravesical gemcitabine .   Changes made to treatment plan:  N/A  Follow up needed:  N/A   Redell JINNY Gaskins, [facility], 11/18/2024  12:33 PM

## 2024-11-23 ENCOUNTER — Other Ambulatory Visit: Payer: Self-pay

## 2024-11-25 ENCOUNTER — Inpatient Hospital Stay

## 2024-11-25 ENCOUNTER — Inpatient Hospital Stay: Admitting: Oncology

## 2024-11-25 ENCOUNTER — Encounter: Payer: Self-pay | Admitting: Oncology

## 2024-11-25 VITALS — BP 136/95 | HR 60

## 2024-11-25 VITALS — BP 133/81 | HR 62 | Temp 97.1°F | Resp 18 | Wt 191.2 lb

## 2024-11-25 DIAGNOSIS — C679 Malignant neoplasm of bladder, unspecified: Secondary | ICD-10-CM

## 2024-11-25 DIAGNOSIS — D751 Secondary polycythemia: Secondary | ICD-10-CM | POA: Diagnosis not present

## 2024-11-25 DIAGNOSIS — R7989 Other specified abnormal findings of blood chemistry: Secondary | ICD-10-CM | POA: Diagnosis not present

## 2024-11-25 DIAGNOSIS — Z5111 Encounter for antineoplastic chemotherapy: Secondary | ICD-10-CM | POA: Diagnosis not present

## 2024-11-25 LAB — CBC WITH DIFFERENTIAL (CANCER CENTER ONLY)
Abs Immature Granulocytes: 0.04 10*3/uL (ref 0.00–0.07)
Basophils Absolute: 0 10*3/uL (ref 0.0–0.1)
Basophils Relative: 1 %
Eosinophils Absolute: 0.1 10*3/uL (ref 0.0–0.5)
Eosinophils Relative: 2 %
HCT: 53.2 % — ABNORMAL HIGH (ref 39.0–52.0)
Hemoglobin: 17.5 g/dL — ABNORMAL HIGH (ref 13.0–17.0)
Immature Granulocytes: 1 %
Lymphocytes Relative: 13 %
Lymphs Abs: 0.9 10*3/uL (ref 0.7–4.0)
MCH: 30.2 pg (ref 26.0–34.0)
MCHC: 32.9 g/dL (ref 30.0–36.0)
MCV: 91.7 fL (ref 80.0–100.0)
Monocytes Absolute: 0.6 10*3/uL (ref 0.1–1.0)
Monocytes Relative: 9 %
Neutro Abs: 4.9 10*3/uL (ref 1.7–7.7)
Neutrophils Relative %: 74 %
Platelet Count: 144 10*3/uL — ABNORMAL LOW (ref 150–400)
RBC: 5.8 MIL/uL (ref 4.22–5.81)
RDW: 13.2 % (ref 11.5–15.5)
WBC Count: 6.6 10*3/uL (ref 4.0–10.5)
nRBC: 0 % (ref 0.0–0.2)

## 2024-11-25 LAB — URINALYSIS, COMPLETE (UACMP) WITH MICROSCOPIC
Bacteria, UA: NONE SEEN
Bilirubin Urine: NEGATIVE
Glucose, UA: NEGATIVE mg/dL
Hgb urine dipstick: NEGATIVE
Ketones, ur: NEGATIVE mg/dL
Leukocytes,Ua: NEGATIVE
Nitrite: NEGATIVE
Protein, ur: NEGATIVE mg/dL
Specific Gravity, Urine: 1.014 (ref 1.005–1.030)
Squamous Epithelial / HPF: 0 /HPF (ref 0–5)
pH: 6 (ref 5.0–8.0)

## 2024-11-25 LAB — CMP (CANCER CENTER ONLY)
ALT: 15 U/L (ref 0–44)
AST: 23 U/L (ref 15–41)
Albumin: 4.2 g/dL (ref 3.5–5.0)
Alkaline Phosphatase: 63 U/L (ref 38–126)
Anion gap: 14 (ref 5–15)
BUN: 16 mg/dL (ref 8–23)
CO2: 26 mmol/L (ref 22–32)
Calcium: 9.2 mg/dL (ref 8.9–10.3)
Chloride: 95 mmol/L — ABNORMAL LOW (ref 98–111)
Creatinine: 1.26 mg/dL — ABNORMAL HIGH (ref 0.61–1.24)
GFR, Estimated: 56 mL/min — ABNORMAL LOW
Glucose, Bld: 107 mg/dL — ABNORMAL HIGH (ref 70–99)
Potassium: 4.9 mmol/L (ref 3.5–5.1)
Sodium: 135 mmol/L (ref 135–145)
Total Bilirubin: 0.5 mg/dL (ref 0.0–1.2)
Total Protein: 6.9 g/dL (ref 6.5–8.1)

## 2024-11-25 MED ORDER — OXYBUTYNIN CHLORIDE 5 MG PO TABS
5.0000 mg | ORAL_TABLET | Freq: Once | ORAL | Status: DC | PRN
  Filled 2024-11-25: qty 1

## 2024-11-25 MED ORDER — LIDOCAINE HCL URETHRAL/MUCOSAL 2 % EX GEL
1.0000 | Freq: Once | CUTANEOUS | Status: AC
  Administered 2024-11-25: 1 via URETHRAL
  Filled 2024-11-25: qty 1

## 2024-11-25 MED ORDER — PROCHLORPERAZINE MALEATE 10 MG PO TABS
10.0000 mg | ORAL_TABLET | Freq: Once | ORAL | Status: AC
  Administered 2024-11-25: 10 mg via ORAL

## 2024-11-25 MED ORDER — GEMCITABINE CHEMO FOR BLADDER INSTILLATION 2000 MG
2000.0000 mg | Freq: Once | INTRAVENOUS | Status: AC
  Administered 2024-11-25: 2000 mg via INTRAVESICAL
  Filled 2024-11-25: qty 52.6

## 2024-11-25 NOTE — Patient Instructions (Signed)
 [facility] - A DEPT OF [facility]  Discharge Instructions: Thank you for choosing [facility] to provide your oncology and hematology care.  If you have a lab appointment with the Cancer Center, please go directly to the Cancer Center and check in at the registration area.  Wear comfortable clothing and clothing appropriate for easy access to any Portacath or PICC line.   We strive to give you quality time with your provider. You may need to reschedule your appointment if you arrive late (15 or more minutes).  Arriving late affects you and other patients whose appointments are after yours.  Also, if you miss three or more appointments without notifying the office, you may be dismissed from the clinic at the providers discretion.      For prescription refill requests, have your pharmacy contact our office and allow 72 hours for refills to be completed.    Today you received the following chemotherapy and/or immunotherapy agents GEMZAR - BLADDER      To help prevent nausea and vomiting after your treatment, we encourage you to take your nausea medication as directed.  BELOW ARE SYMPTOMS THAT SHOULD BE REPORTED IMMEDIATELY: *FEVER GREATER THAN 100.4 F (38 C) OR HIGHER *CHILLS OR SWEATING *NAUSEA AND VOMITING THAT IS NOT CONTROLLED WITH YOUR NAUSEA MEDICATION *UNUSUAL SHORTNESS OF BREATH *UNUSUAL BRUISING OR BLEEDING *URINARY PROBLEMS (pain or burning when urinating, or frequent urination) *BOWEL PROBLEMS (unusual diarrhea, constipation, pain near the anus) TENDERNESS IN MOUTH AND THROAT WITH OR WITHOUT PRESENCE OF ULCERS (sore throat, sores in mouth, or a toothache) UNUSUAL RASH, SWELLING OR PAIN  UNUSUAL VAGINAL DISCHARGE OR ITCHING   Items with * indicate a potential emergency and should be followed up as soon as possible or go to the Emergency Department if any problems should occur.  Please show the CHEMOTHERAPY ALERT CARD or  IMMUNOTHERAPY ALERT CARD at check-in to the Emergency Department and triage nurse.  Should you have questions after your visit or need to cancel or reschedule your appointment, please contact [facility] - A DEPT OF [facility]  [phone] and follow the prompts.  Office hours are 8:00 a.m. to 4:30 p.m. Monday - Friday. Please note that voicemails left after 4:00 p.m. may not be returned until the following business day.  We are closed weekends and major holidays. You have access to a nurse at all times for urgent questions. Please call the main number to the clinic [phone] and follow the prompts.  For any non-urgent questions, you may also contact your provider using MyChart. We now offer e-Visits for anyone 74 and older to request care online for non-urgent symptoms. For details visit [email or web address].   Also download the MyChart app! Go to the app store, search MyChart, open the app, select [facility], and log in with your MyChart username and password.   Gemcitabine  Injection What is this medication? GEMCITABINE  (jem SYE ta been) treats some types of cancer. It works by slowing down the growth of cancer cells. This medicine may be used for other purposes; ask your health care provider or pharmacist if you have questions. COMMON BRAND NAME(S): Gemzar , Infugem  What should I tell my care team before I take this medication? They need to know if you have any of these conditions: Blood disorders Infection Kidney disease Liver disease Lung or breathing disease, such as asthma or COPD Recent or ongoing radiation therapy An unusual  or allergic reaction to gemcitabine , other medications, foods, dyes, or preservatives If you or your partner are pregnant or trying to get pregnant Breast-feeding How should I use this medication? This medication is injected into a vein. It is given by your care team in a hospital or clinic setting. Talk to your care  team about the use of this medication in children. Special care may be needed. Overdosage: If you think you have taken too much of this medicine contact a poison control center or emergency room at once. NOTE: This medicine is only for you. Do not share this medicine with others. What if I miss a dose? Keep appointments for follow-up doses. It is important not to miss your dose. Call your care team if you are unable to keep an appointment. What may interact with this medication? Interactions have not been studied. This list may not describe all possible interactions. Give your health care provider a list of all the medicines, herbs, non-prescription drugs, or dietary supplements you use. Also tell them if you smoke, drink alcohol , or use illegal drugs. Some items may interact with your medicine. What should I watch for while using this medication? Your condition will be monitored carefully while you are receiving this medication. This medication may make you feel generally unwell. This is not uncommon, as chemotherapy can affect healthy cells as well as cancer cells. Report any side effects. Continue your course of treatment even though you feel ill unless your care team tells you to stop. In some cases, you may be given additional medications to help with side effects. Follow all directions for their use. This medication may increase your risk of getting an infection. Call your care team for advice if you get a fever, chills, sore throat, or other symptoms of a cold or flu. Do not treat yourself. Try to avoid being around people who are sick. This medication may increase your risk to bruise or bleed. Call your care team if you notice any unusual bleeding. Be careful brushing or flossing your teeth or using a toothpick because you may get an infection or bleed more easily. If you have any dental work done, tell your dentist you are receiving this medication. Avoid taking medications that contain  aspirin, acetaminophen , ibuprofen, naproxen, or ketoprofen unless instructed by your care team. These medications may hide a fever. Talk to your care team if you or your partner wish to become pregnant or think you might be pregnant. This medication can cause serious birth defects if taken during pregnancy and for 6 months after the last dose. A negative pregnancy test is required before starting this medication. A reliable form of contraception is recommended while taking this medication and for 6 months after the last dose. Talk to your care team about effective forms of contraception. Do not father a child while taking this medication and for 3 months after the last dose. Use a condom while having sex during this time period. Do not breastfeed while taking this medication and for at least 1 week after the last dose. This medication may cause infertility. Talk to your care team if you are concerned about your fertility. What side effects may I notice from receiving this medication? Side effects that you should report to your care team as soon as possible: Allergic reactions--skin rash, itching, hives, swelling of the face, lips, tongue, or throat Capillary leak syndrome--stomach or muscle pain, unusual weakness or fatigue, feeling faint or lightheaded, decrease in the amount of urine,  swelling of the ankles, hands, or feet, trouble breathing Infection--fever, chills, cough, sore throat, wounds that don't heal, pain or trouble when passing urine, general feeling of discomfort or being unwell Liver injury--right upper belly pain, loss of appetite, nausea, light-colored stool, dark yellow or brown urine, yellowing skin or eyes, unusual weakness or fatigue Low red blood cell level--unusual weakness or fatigue, dizziness, headache, trouble breathing Lung injury--shortness of breath or trouble breathing, cough, spitting up blood, chest pain, fever Stomach pain, bloody diarrhea, pale skin, unusual weakness or  fatigue, decrease in the amount of urine, which may be signs of hemolytic uremic syndrome Sudden and severe headache, confusion, change in vision, seizures, which may be signs of posterior reversible encephalopathy syndrome (PRES) Unusual bruising or bleeding Side effects that usually do not require medical attention (report to your care team if they continue or are bothersome): Diarrhea Drowsiness Hair loss Nausea Pain, redness, or swelling with sores inside the mouth or throat Vomiting This list may not describe all possible side effects. Call your doctor for medical advice about side effects. You may report side effects to FDA at 1-800-FDA-1088. Where should I keep my medication? This medication is given in a hospital or clinic. It will not be stored at home. NOTE: This sheet is a summary. It may not cover all possible information. If you have questions about this medicine, talk to your doctor, pharmacist, or health care provider.  2024 [employer]vier/Gold Standard (2022-02-19 00:00:00)

## 2024-11-25 NOTE — Progress Notes (Signed)
 " Hematology/Oncology progress note [phone]    Patient Care Team: Sadie Manna, MD as PCP - General (Internal Medicine) Babara Call, MD as Consulting Physician (Oncology)  ASSESSMENT & PLAN:   Urothelial carcinoma of bladder (HCC) Recurrent low-grade Ta urothelial carcinoma, multifocal, early recurrence Urology recommend adjuvant intravesical gemcitabine .  Labs reviewed and discussed patient. 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 Labs reviewed and discussed with patient I will hold off phlebotomy due to being on chemotherapy and anticipate possible marrow suppression from treatments.  Encounter for antineoplastic chemotherapy Treatment plan as listed above  Orders Placed This Encounter  Procedures   Urinalysis, Complete w Microscopic    Standing Status:   Future    Expected Date:   12/09/2024    Expiration Date:   12/09/2025   CBC with Differential (Cancer Center Only)    Standing Status:   Future    Expected Date:   12/09/2024    Expiration Date:   12/09/2025   CMP (Cancer Center only)    Standing Status:   Future    Expected Date:   12/09/2024    Expiration Date:   12/09/2025   Urinalysis, Complete w Microscopic    Standing Status:   Future    Expected Date:   12/16/2024    Expiration Date:   12/16/2025   CBC with Differential (Cancer Center Only)    Standing Status:   Future    Expected Date:   12/16/2024    Expiration Date:   12/16/2025   CMP (Cancer Center only)    Standing Status:   Future    Expected Date:   12/16/2024    Expiration Date:   12/16/2025   Urinalysis, Complete w Microscopic    Standing Status:   Future    Expected Date:   12/23/2024    Expiration Date:   12/23/2025   CBC with Differential (Cancer Center Only)    Standing Status:   Future    Expected Date:   12/23/2024    Expiration Date:   12/23/2025   CMP (Cancer Center only)    Standing Status:   Future     Expected Date:   12/23/2024    Expiration Date:   12/23/2025   Urinalysis, Complete w Microscopic    Standing Status:   Future    Expected Date:   12/30/2024    Expiration Date:   12/30/2025   CBC with Differential (Cancer Center Only)    Standing Status:   Future    Expected Date:   12/30/2024    Expiration Date:   12/30/2025   CMP (Cancer Center only)    Standing Status:   Future    Expected Date:   12/30/2024    Expiration Date:   12/30/2025   Follow up 1 week All questions were answered. The patient knows to call the clinic with any problems, questions or concerns.  Call Babara, MD, PhD [facility] Hematology Oncology 11/25/2024   CHIEF COMPLAINTS/REASON FOR VISIT:  Secondary erythrocytosis  HISTORY OF PRESENTING ILLNESS:  Thomas Palmer is a 84 y.o. male who was seen in consultation at the request of Babara Call, MD for evaluation of polycytosis/erythrocytosis Reviewed recent labs done at the primary care provider's office. 12/14/2020, hemoglobin 19.4, hematocrit 59.2, MCV 94.7, WBC 5.6, Chronic Onset, intermittent, duration since 2020. No aggravating or alleviating factors.   Associated signs or symptoms: Denies weight loss, fever, chills, fatigue, night sweats.   Context:  Smoking history: Former smoker. Testosterone  supplements: Patient is on testosterone  cream. History of blood clots: Denies Daytime somnolence: Denies Family history of polycythemia: Denies  He is on testosterone  replacement therapy-75 mg.  INTERVAL HISTORY Thomas Palmer is a 84 y.o. male who has above history reviewed by me today presents for follow up visit for secondary erythrocytosis, low-grade urothelial carcinoma. Patient reports feeling well today Oncology History  Urothelial carcinoma of bladder (HCC)  11/15/2024 Initial Diagnosis   Urothelial carcinoma of bladder  09/16/2023, TA urothelial carcinoma of bladder-low-grade status post TURBT for multi focal bladder tumors.  Largest was 3 cm anterior  bladder neck.  Patient received post resection gemcitabine .  Declined 6-week course of intravesical chemotherapy.  10/07/2024 he developed recurrence.   Cystoscopy findings Urethra normal Without strictures; prominent lateral lobe enlargement with hypervascularity; UOs normal-appearing bilaterally; moderate bladder trabeculation with cellules The following bladder tumors were identified: ~5 mm papillary tumor upper posterior wall ~3 mm elongated papillary tumor right posterior wall  Pathology showed 1. Bladder, transurethral resection, right posterior wall tumor :       - LOW-GRADE PAPILLARY UROTHELIAL CARCINOMA   2. Bladder, transurethral resection, upper posterior bladder wall tumor :       - LOW-GRADE PAPILLARY UROTHELIAL CARCINOMA     11/15/2024 Cancer Staging   Staging form: Urinary Bladder, AJCC 8th Edition - Clinical stage from 11/15/2024: Stage 0a (rcTa, rcN0, rcM0) - Signed by Babara Call, MD on 11/15/2024 Stage prefix: Recurrence   11/25/2024 -  Chemotherapy   Patient is on Treatment Plan : BLADDER Gemcitabine  INTRAVESICAL (2000) q7d      Patient present to discuss intravesical gemcitabine  treatments. He reports feeling well.  Denies any dysuria, urinary difficulty, urgency.  He endorses frequent urination.  Review of Systems  Constitutional:  Positive for fatigue. Negative for appetite change, chills, fever and unexpected weight change.  HENT:   Negative for hearing loss and voice change.   Eyes:  Negative for eye problems and icterus.  Respiratory:  Negative for chest tightness, cough and shortness of breath.   Cardiovascular:  Negative for chest pain and leg swelling.  Gastrointestinal:  Negative for abdominal distention and abdominal pain.  Endocrine: Negative for hot flashes.  Genitourinary:  Positive for frequency. Negative for difficulty urinating and dysuria.   Musculoskeletal:  Negative for arthralgias.  Skin:  Negative for itching and rash.  Neurological:  Negative  for light-headedness and numbness.  Hematological:  Negative for adenopathy. Does not bruise/bleed easily.  Psychiatric/Behavioral:  Negative for confusion.     MEDICAL HISTORY:  Past Medical History:  Diagnosis Date   Actinic keratosis    Acute appendicitis    Arthritis    joints in hips and fingers; shoulders too (07/13/2013)   Bladder cancer (HCC)    Chronic lower back pain    COPD (chronic obstructive pulmonary disease) (HCC)    GOLD stage 3   Depression    Diverticulosis of large intestine without hemorrhage    Erythrocytosis 01/01/2021   Gastric ulcer    H/O hiatal hernia    Hyperlipidemia    Hypertension    Hypothyroidism    IDA (iron deficiency anemia)    OP (osteoporosis)    Pneumonia 08/2011   SCC (squamous cell carcinoma) 10/04/2024   right intertragus, tx Mohs Dr. Corey 1.20.2026   Spinal stenosis of lumbar region with neurogenic claudication     SURGICAL HISTORY: Past Surgical History:  Procedure Laterality Date   ACHILLES TENDON REPAIR Left 2021   APPENDECTOMY  BACK SURGERY     kyphoplasty   BLADDER INSTILLATION N/A 09/16/2023   Procedure: BLADDER INSTILLATION OF GEMCITABINE ;  Surgeon: Twylla Glendia BROCKS, MD;  Location: [facility] ORS;  Service: Urology;  Laterality: N/A;   BLADDER INSTILLATION N/A 10/07/2024   Procedure: INSTILLATION, BLADDER;  Surgeon: Twylla Glendia BROCKS, MD;  Location: [facility] ORS;  Service: Urology;  Laterality: N/A;  W/ Gemcitabine    CARDIAC CATHETERIZATION  ~ 2009   COLONOSCOPY WITH PROPOFOL  N/A 05/03/2019   Procedure: COLONOSCOPY WITH PROPOFOL ;  Surgeon: Gaylyn Gladis PENNER, MD;  Location[facility] ENDOSCOPY;  Service: Endoscopy;  Laterality: N/A;   CYSTOSCOPY WITH BIOPSY N/A 10/07/2024   Procedure: CYSTOSCOPY, WITH BIOPSY;  Surgeon: Twylla Glendia BROCKS, MD;  Location: [facility] ORS;  Service: Urology;  Laterality: N/A;  bladder biposy and fulgeration   FIXATION KYPHOPLASTY LUMBAR SPINE  ~ 2012   L1 (07/13/2013)   JOINT REPLACEMENT     LAPAROSCOPIC  APPENDECTOMY N/A 02/22/2018   Procedure: APPENDECTOMY LAPAROSCOPIC;  Surgeon: Nicholaus Selinda Birmingham, MD;  Location: [facility] ORS;  Service: General;  Laterality: N/A;   LUMBAR LAMINECTOMY/DECOMPRESSION MICRODISCECTOMY Bilateral 05/08/2023   Procedure: Lumbar three-four, Lumbar four-five LAMINOTOMY, FORAMINOTOMY;  Surgeon: Mavis Purchase, MD;  Location: [facility] OR;  Service: Neurosurgery;  Laterality: Bilateral;   TOTAL HIP ARTHROPLASTY Left 07/12/2013   Procedure: TOTAL HIP ARTHROPLASTY;  Surgeon: Marcey Raman, MD;  Location: [facility] OR;  Service: Orthopedics;  Laterality: Left;   TOTAL HIP ARTHROPLASTY Right 05/17/2002   TRANSURETHRAL RESECTION OF BLADDER TUMOR N/A 09/16/2023   Procedure: TRANSURETHRAL RESECTION OF BLADDER TUMOR (TURBT);  Surgeon: Twylla Glendia BROCKS, MD;  Location: [facility] ORS;  Service: Urology;  Laterality: N/A;    SOCIAL HISTORY: Social History   Socioeconomic History   Marital status: Married    Spouse name: brenda   Number of children: Not on file   Years of education: Not on file   Highest education level: Not on file  Occupational History   Not on file  Tobacco Use   Smoking status: Former    Types: Pipe    Quit date: 07/07/1989    Years since quitting: 35.4    Passive exposure: Never   Smokeless tobacco: Former    Types: Chew    Quit date: 10/28/1989  Vaping Use   Vaping status: Never Used  Substance and Sexual Activity   Alcohol  use: Yes    Alcohol /week: 5.0 standard drinks of alcohol     Types: 5 Cans of beer per week   Drug use: No   Sexual activity: Yes  Other Topics Concern   Not on file  Social History Narrative   Not on file   Social Drivers of Health   Tobacco Use: Medium Risk (11/25/2024)   Patient History    Smoking Tobacco Use: Former    Smokeless Tobacco Use: Former    Passive Exposure: Never  Programmer, Applications: Low Risk  (07/22/2024)   Received from [facility]   Overall Financial Resource Strain (CARDIA)    Difficulty of  Paying Living Expenses: Not hard at all  Food Insecurity: No Food Insecurity (07/22/2024)   Received from [facility]   Epic    Within the past 12 months, you worried that your food would run out before you got the money to buy more.: Never true    Within the past 12 months, the food you bought just didn't last and you didn't have money to get more.: Never true  Transportation Needs: No Transportation Needs (07/22/2024)  Received from [facility] - Transportation    In the past 12 months, has lack of transportation kept you from medical appointments or from getting medications?: No    Lack of Transportation (Non-Medical): No  Physical Activity: Not on file  Stress: Not on file  Social Connections: Not on file  Intimate Partner Violence: Not on file  Depression (PHQ2-9): Low Risk (11/25/2024)   Depression (PHQ2-9)    PHQ-2 Score: 0  Alcohol  Screen: Not on file  Housing: Low Risk  (07/22/2024)   Received from [facility]   Epic    In the last 12 months, was there a time when you were not able to pay the mortgage or rent on time?: No    In the past 12 months, how many times have you moved where you were living?: 0    At any time in the past 12 months, were you homeless or living in a shelter (including now)?: No  Utilities: Not At Risk (07/22/2024)   Received from [facility]   Epic    In the past 12 months has the electric, gas, oil, or water  company threatened to shut off services in your home?: No  Health Literacy: Not on file    FAMILY HISTORY: Family History  Problem Relation Age of Onset   Skin cancer Father    Stomach cancer Maternal Grandmother     ALLERGIES:  is allergic to contrast media [iodinated contrast media] and iodine.  MEDICATIONS:  Current Outpatient Medications  Medication Sig Dispense Refill   Alpha-Lipoic Acid 300 MG TABS Take 300 mg by mouth daily.     Ascorbic Acid (VITAMIN  C WITH ROSE HIPS) 500 MG tablet Take 500 mg by mouth daily.     calcium citrate (CALCITRATE - DOSED IN MG ELEMENTAL CALCIUM) 950 (200 Ca) MG tablet Take 200 mg of elemental calcium by mouth daily.     Cholecalciferol 125 MCG (5000 UT) TABS Take 5,000 Units by mouth daily.     finasteride  (PROSCAR ) 5 MG tablet Take 5 mg by mouth daily.     fluorouracil  (EFUDEX ) 5 % cream Apply to aa arm twice daily until red and irritated up to 5 weeks. 40 g 0   Fluticasone -Umeclidin-Vilant (TRELEGY ELLIPTA) 100-62.5-25 MCG/ACT AEPB Inhale 1 puff into the lungs daily.     gentamicin  ointment (GARAMYCIN ) 0.1 % Apply 1 Application topically 3 (three) times daily. 15 g 0   levothyroxine  (SYNTHROID ) 50 MCG tablet Take 50 mcg by mouth daily before breakfast. Take with 200 mcg to equal 250 mcg daily     levothyroxine  (SYNTHROID , LEVOTHROID) 200 MCG tablet Take 200 mcg by mouth daily before breakfast. Take with 50 mcg to equal 250 mcg daily     LITHIUM OROTATE PO Take 240 mg by mouth daily.     losartan (COZAAR) 50 MG tablet Take 50 mg by mouth daily.     Multiple Vitamins-Minerals (CENTRUM SILVER 50+MEN PO) Take 1 tablet by mouth daily.     Multiple Vitamins-Minerals (PRESERVISION AREDS 2) CAPS Take 1 capsule by mouth 2 (two) times daily.     mupirocin  ointment (BACTROBAN ) 2 % Apply 1 Application topically 2 (two) times daily. 22 g 1   ondansetron  (ZOFRAN ) 8 MG tablet Take 1 tablet (8 mg total) by mouth every 8 (eight) hours as needed for nausea or vomiting. 30 tablet 1   tadalafil (CIALIS) 20 MG tablet Take 20 mg by mouth daily as  needed for erectile dysfunction.     TESTOSTERONE  COMPOUNDING KIT TD Place 1 mL onto the skin daily. TESTOSTERONE  75MG /ML VERSATILE CR  4 clicks     Turmeric (QC TUMERIC COMPLEX PO) Take 800 mg by mouth daily.     zinc  gluconate 50 MG tablet Take 50 mg by mouth daily.     No current facility-administered medications for this visit.   Facility-Administered Medications Ordered in Other Visits   Medication Dose Route Frequency Provider Last Rate Last Admin   oxybutynin  (DITROPAN ) tablet 5 mg  5 mg Oral Once PRN Babara Call, MD         PHYSICAL EXAMINATION: ECOG PERFORMANCE STATUS: 1 - Symptomatic but completely ambulatory Vitals:   11/25/24 1334  BP: 133/81  Pulse: 62  Resp: 18  Temp: (!) 97.1 F (36.2 C)  SpO2: 94%   Filed Weights   11/25/24 1334  Weight: 191 lb 3.2 oz (86.7 kg)    Physical Exam Constitutional:      General: He is not in acute distress. HENT:     Head: Normocephalic and atraumatic.  Eyes:     General: No scleral icterus. Cardiovascular:     Rate and Rhythm: Normal rate and regular rhythm.  Pulmonary:     Effort: Pulmonary effort is normal. No respiratory distress.  Abdominal:     General: There is no distension.  Musculoskeletal:        General: No deformity. Normal range of motion.     Cervical back: Normal range of motion and neck supple.  Skin:    General: Skin is warm.     Findings: No erythema or rash.  Neurological:     Mental Status: He is alert and oriented to person, place, and time. Mental status is at baseline.  Psychiatric:        Mood and Affect: Mood normal.     RADIOGRAPHIC STUDIES: I have personally reviewed the radiological images as listed and agreed with the findings in the report. No results found.   LABORATORY DATA:  I have reviewed the data as listed    Latest Ref Rng & Units 11/25/2024   12:45 PM 09/29/2024    8:46 AM 07/24/2024    1:47 PM  CBC  WBC 4.0 - 10.5 K/uL 6.6  4.5  7.7   Hemoglobin 13.0 - 17.0 g/dL 82.4  83.2  82.7   Hematocrit 39.0 - 52.0 % 53.2  51.5  51.3   Platelets 150 - 400 K/uL 144  153  138     "

## 2024-11-25 NOTE — Assessment & Plan Note (Signed)
 Encourage oral hydration.  Will repeat testing next week.

## 2024-11-25 NOTE — Assessment & Plan Note (Signed)
 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 Labs reviewed and discussed with patient I will hold off phlebotomy due to being on chemotherapy and anticipate possible marrow suppression from treatments.

## 2024-11-25 NOTE — Assessment & Plan Note (Signed)
 Treatment plan as listed above.

## 2024-11-25 NOTE — Assessment & Plan Note (Addendum)
 Recurrent low-grade Ta urothelial carcinoma, multifocal, early recurrence Urology recommend adjuvant intravesical gemcitabine .  Labs reviewed and discussed patient. Proceed with cycle 1 intravesical gemcitabine 

## 2024-11-29 ENCOUNTER — Telehealth: Payer: Self-pay

## 2024-11-29 NOTE — Telephone Encounter (Signed)
Telephone call to patient for follow up after receiving first infusion.   Patient states infusion went great.  States eating good and drinking plenty of fluids.   Denies any nausea or vomiting.  Encouraged patient to call for any concerns or questions. 

## 2024-12-02 ENCOUNTER — Encounter: Payer: Self-pay | Admitting: Oncology

## 2024-12-02 ENCOUNTER — Inpatient Hospital Stay: Admitting: Oncology

## 2024-12-02 ENCOUNTER — Inpatient Hospital Stay

## 2024-12-02 VITALS — BP 124/74 | HR 62 | Temp 96.0°F | Resp 18 | Wt 191.8 lb

## 2024-12-02 DIAGNOSIS — C679 Malignant neoplasm of bladder, unspecified: Secondary | ICD-10-CM

## 2024-12-02 DIAGNOSIS — R7989 Other specified abnormal findings of blood chemistry: Secondary | ICD-10-CM | POA: Diagnosis not present

## 2024-12-02 DIAGNOSIS — D751 Secondary polycythemia: Secondary | ICD-10-CM | POA: Diagnosis not present

## 2024-12-02 DIAGNOSIS — Z5111 Encounter for antineoplastic chemotherapy: Secondary | ICD-10-CM | POA: Diagnosis not present

## 2024-12-02 LAB — CBC WITH DIFFERENTIAL (CANCER CENTER ONLY)
Abs Immature Granulocytes: 0.04 10*3/uL (ref 0.00–0.07)
Basophils Absolute: 0 10*3/uL (ref 0.0–0.1)
Basophils Relative: 1 %
Eosinophils Absolute: 0.1 10*3/uL (ref 0.0–0.5)
Eosinophils Relative: 2 %
HCT: 51.8 % (ref 39.0–52.0)
Hemoglobin: 17.4 g/dL — ABNORMAL HIGH (ref 13.0–17.0)
Immature Granulocytes: 1 %
Lymphocytes Relative: 14 %
Lymphs Abs: 0.9 10*3/uL (ref 0.7–4.0)
MCH: 30.6 pg (ref 26.0–34.0)
MCHC: 33.6 g/dL (ref 30.0–36.0)
MCV: 91 fL (ref 80.0–100.0)
Monocytes Absolute: 0.5 10*3/uL (ref 0.1–1.0)
Monocytes Relative: 8 %
Neutro Abs: 5 10*3/uL (ref 1.7–7.7)
Neutrophils Relative %: 74 %
Platelet Count: 144 10*3/uL — ABNORMAL LOW (ref 150–400)
RBC: 5.69 MIL/uL (ref 4.22–5.81)
RDW: 13.2 % (ref 11.5–15.5)
WBC Count: 6.7 10*3/uL (ref 4.0–10.5)
nRBC: 0 % (ref 0.0–0.2)

## 2024-12-02 LAB — CMP (CANCER CENTER ONLY)
ALT: 14 U/L (ref 0–44)
AST: 25 U/L (ref 15–41)
Albumin: 4.1 g/dL (ref 3.5–5.0)
Alkaline Phosphatase: 60 U/L (ref 38–126)
Anion gap: 12 (ref 5–15)
BUN: 19 mg/dL (ref 8–23)
CO2: 28 mmol/L (ref 22–32)
Calcium: 9.2 mg/dL (ref 8.9–10.3)
Chloride: 95 mmol/L — ABNORMAL LOW (ref 98–111)
Creatinine: 1.42 mg/dL — ABNORMAL HIGH (ref 0.61–1.24)
GFR, Estimated: 48 mL/min — ABNORMAL LOW
Glucose, Bld: 92 mg/dL (ref 70–99)
Potassium: 4.5 mmol/L (ref 3.5–5.1)
Sodium: 135 mmol/L (ref 135–145)
Total Bilirubin: 0.6 mg/dL (ref 0.0–1.2)
Total Protein: 6.7 g/dL (ref 6.5–8.1)

## 2024-12-02 LAB — URINALYSIS, COMPLETE (UACMP) WITH MICROSCOPIC
Bacteria, UA: NONE SEEN
Bilirubin Urine: NEGATIVE
Glucose, UA: NEGATIVE mg/dL
Hgb urine dipstick: NEGATIVE
Ketones, ur: NEGATIVE mg/dL
Leukocytes,Ua: NEGATIVE
Nitrite: NEGATIVE
Protein, ur: NEGATIVE mg/dL
RBC / HPF: 0 RBC/hpf (ref 0–5)
Specific Gravity, Urine: 1.014 (ref 1.005–1.030)
pH: 6 (ref 5.0–8.0)

## 2024-12-02 MED ORDER — PROCHLORPERAZINE MALEATE 10 MG PO TABS
10.0000 mg | ORAL_TABLET | Freq: Once | ORAL | Status: AC
  Administered 2024-12-02: 10 mg via ORAL

## 2024-12-02 MED ORDER — GEMCITABINE CHEMO FOR BLADDER INSTILLATION 2000 MG
2000.0000 mg | Freq: Once | INTRAVENOUS | Status: AC
  Administered 2024-12-02: 2000 mg via INTRAVESICAL
  Filled 2024-12-02: qty 2000

## 2024-12-02 MED ORDER — LIDOCAINE HCL URETHRAL/MUCOSAL 2 % EX GEL
1.0000 | Freq: Once | CUTANEOUS | Status: AC
  Administered 2024-12-02: 1 via URETHRAL
  Filled 2024-12-02: qty 1

## 2024-12-02 MED ORDER — SODIUM CHLORIDE 0.9 % IV SOLN
INTRAVENOUS | Status: DC
  Filled 2024-12-02: qty 250

## 2024-12-02 NOTE — Assessment & Plan Note (Signed)
 Recurrent low-grade Ta urothelial carcinoma, multifocal, early recurrence Urology recommend adjuvant intravesical gemcitabine .  Labs reviewed and discussed patient. Proceed with cycle 2 intravesical gemcitabine 

## 2024-12-02 NOTE — Assessment & Plan Note (Signed)
 Treatment plan as listed above.

## 2024-12-02 NOTE — Patient Instructions (Signed)
 [facility] - A DEPT OF [facility]  Discharge Instructions: Thank you for choosing [facility] to provide your oncology and hematology care.  If you have a lab appointment with the Cancer Center, please go directly to the Cancer Center and check in at the registration area.  Wear comfortable clothing and clothing appropriate for easy access to any Portacath or PICC line.   We strive to give you quality time with your provider. You may need to reschedule your appointment if you arrive late (15 or more minutes).  Arriving late affects you and other patients whose appointments are after yours.  Also, if you miss three or more appointments without notifying the office, you may be dismissed from the clinic at the provider's discretion.      For prescription refill requests, have your pharmacy contact our office and allow 72 hours for refills to be completed.    Today you received the following chemotherapy and/or immunotherapy agents GEMZAR      To help prevent nausea and vomiting after your treatment, we encourage you to take your nausea medication as directed.  BELOW ARE SYMPTOMS THAT SHOULD BE REPORTED IMMEDIATELY: *FEVER GREATER THAN 100.4 F (38 C) OR HIGHER *CHILLS OR SWEATING *NAUSEA AND VOMITING THAT IS NOT CONTROLLED WITH YOUR NAUSEA MEDICATION *UNUSUAL SHORTNESS OF BREATH *UNUSUAL BRUISING OR BLEEDING *URINARY PROBLEMS (pain or burning when urinating, or frequent urination) *BOWEL PROBLEMS (unusual diarrhea, constipation, pain near the anus) TENDERNESS IN MOUTH AND THROAT WITH OR WITHOUT PRESENCE OF ULCERS (sore throat, sores in mouth, or a toothache) UNUSUAL RASH, SWELLING OR PAIN  UNUSUAL VAGINAL DISCHARGE OR ITCHING   Items with * indicate a potential emergency and should be followed up as soon as possible or go to the Emergency Department if any problems should occur.  Please show the CHEMOTHERAPY ALERT CARD or IMMUNOTHERAPY ALERT  CARD at check-in to the Emergency Department and triage nurse.  Should you have questions after your visit or need to cancel or reschedule your appointment, please contact [facility] - A DEPT OF [facility]  [phone] and follow the prompts.  Office hours are 8:00 a.m. to 4:30 p.m. Monday - Friday. Please note that voicemails left after 4:00 p.m. may not be returned until the following business day.  We are closed weekends and major holidays. You have access to a nurse at all times for urgent questions. Please call the main number to the clinic [phone] and follow the prompts.  For any non-urgent questions, you may also contact your provider using MyChart. We now offer e-Visits for anyone 58 and older to request care online for non-urgent symptoms. For details visit [email or web address].   Also download the MyChart app! Go to the app store, search "MyChart", open the app, select [place], and log in with your MyChart username and password.  Gemcitabine Injection What is this medication? GEMCITABINE (jem SYE ta been) treats some types of cancer. It works by slowing down the growth of cancer cells. This medicine may be used for other purposes; ask your health care provider or pharmacist if you have questions. COMMON BRAND NAME(S): Gemzar, Infugem What should I tell my care team before I take this medication? They need to know if you have any of these conditions: Blood disorders Infection Kidney disease Liver disease Lung or breathing disease, such as asthma or COPD Recent or ongoing radiation therapy An unusual or allergic  reaction to gemcitabine, other medications, foods, dyes, or preservatives If you or your partner are pregnant or trying to get pregnant Breast-feeding How should I use this medication? This medication is injected into a vein. It is given by your care team in a hospital or clinic setting. Talk to your care team about the use of  this medication in children. Special care may be needed. Overdosage: If you think you have taken too much of this medicine contact a poison control center or emergency room at once. NOTE: This medicine is only for you. Do not share this medicine with others. What if I miss a dose? Keep appointments for follow-up doses. It is important not to miss your dose. Call your care team if you are unable to keep an appointment. What may interact with this medication? Interactions have not been studied. This list may not describe all possible interactions. Give your health care provider a list of all the medicines, herbs, non-prescription drugs, or dietary supplements you use. Also tell them if you smoke, drink alcohol, or use illegal drugs. Some items may interact with your medicine. What should I watch for while using this medication? Your condition will be monitored carefully while you are receiving this medication. This medication may make you feel generally unwell. This is not uncommon, as chemotherapy can affect healthy cells as well as cancer cells. Report any side effects. Continue your course of treatment even though you feel ill unless your care team tells you to stop. In some cases, you may be given additional medications to help with side effects. Follow all directions for their use. This medication may increase your risk of getting an infection. Call your care team for advice if you get a fever, chills, sore throat, or other symptoms of a cold or flu. Do not treat yourself. Try to avoid being around people who are sick. This medication may increase your risk to bruise or bleed. Call your care team if you notice any unusual bleeding. Be careful brushing or flossing your teeth or using a toothpick because you may get an infection or bleed more easily. If you have any dental work done, tell your dentist you are receiving this medication. Avoid taking medications that contain aspirin, acetaminophen,  ibuprofen, naproxen, or ketoprofen unless instructed by your care team. These medications may hide a fever. Talk to your care team if you or your partner wish to become pregnant or think you might be pregnant. This medication can cause serious birth defects if taken during pregnancy and for 6 months after the last dose. A negative pregnancy test is required before starting this medication. A reliable form of contraception is recommended while taking this medication and for 6 months after the last dose. Talk to your care team about effective forms of contraception. Do not father a child while taking this medication and for 3 months after the last dose. Use a condom while having sex during this time period. Do not breastfeed while taking this medication and for at least 1 week after the last dose. This medication may cause infertility. Talk to your care team if you are concerned about your fertility. What side effects may I notice from receiving this medication? Side effects that you should report to your care team as soon as possible: Allergic reactions--skin rash, itching, hives, swelling of the face, lips, tongue, or throat Capillary leak syndrome--stomach or muscle pain, unusual weakness or fatigue, feeling faint or lightheaded, decrease in the amount of urine, swelling of  the ankles, hands, or feet, trouble breathing Infection--fever, chills, cough, sore throat, wounds that don't heal, pain or trouble when passing urine, general feeling of discomfort or being unwell Liver injury--right upper belly pain, loss of appetite, nausea, light-colored stool, dark yellow or brown urine, yellowing skin or eyes, unusual weakness or fatigue Low red blood cell level--unusual weakness or fatigue, dizziness, headache, trouble breathing Lung injury--shortness of breath or trouble breathing, cough, spitting up blood, chest pain, fever Stomach pain, bloody diarrhea, pale skin, unusual weakness or fatigue, decrease in  the amount of urine, which may be signs of hemolytic uremic syndrome Sudden and severe headache, confusion, change in vision, seizures, which may be signs of posterior reversible encephalopathy syndrome (PRES) Unusual bruising or bleeding Side effects that usually do not require medical attention (report to your care team if they continue or are bothersome): Diarrhea Drowsiness Hair loss Nausea Pain, redness, or swelling with sores inside the mouth or throat Vomiting This list may not describe all possible side effects. Call your doctor for medical advice about side effects. You may report side effects to FDA at 1-800-FDA-1088. Where should I keep my medication? This medication is given in a hospital or clinic. It will not be stored at home. NOTE: This sheet is a summary. It may not cover all possible information. If you have questions about this medicine, talk to your doctor, pharmacist, or health care provider.  2024 Elsevier/Gold Standard (2022-02-19 00:00:00)

## 2024-12-02 NOTE — Assessment & Plan Note (Signed)
 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 Labs reviewed and discussed with patient I will hold off phlebotomy due to being on chemotherapy and anticipate possible marrow suppression from treatments.

## 2024-12-02 NOTE — Progress Notes (Signed)
 " Hematology/Oncology progress note [phone]    Patient Care Team: Sadie Manna, MD as PCP - General (Internal Medicine) Babara Call, MD as Consulting Physician (Oncology)  ASSESSMENT & PLAN:   Urothelial carcinoma of bladder (HCC) Recurrent low-grade Ta urothelial carcinoma, multifocal, early recurrence Urology recommend adjuvant intravesical gemcitabine .  Labs reviewed and discussed patient. Proceed with cycle 2 intravesical gemcitabine    Increase in serum creatinine from prior measurement Increase of creatinine is pre-existing to first dose of intravesical gemcitabine . Encourage oral hydration.  Cr is trending up.  Patient denies urinary retention/urination difficulty symptoms.  No dysuria.  Chronically he has increased frequency.   Obtain ultrasound renal for further evaluation.   Erythrocytosis Secondary erythrocytosis due to testosterone  use. Labs reviewed and discussed with patient I will hold off phlebotomy due to being on chemotherapy and anticipate possible marrow suppression from treatments.  Encounter for antineoplastic chemotherapy Treatment plan as listed above  Orders Placed This Encounter  Procedures   US  RENAL    Standing Status:   Future    Expected Date:   12/06/2024    Expiration Date:   12/02/2025    Reason for Exam (SYMPTOM  OR DIAGNOSIS REQUIRED):   elevated creat level    Preferred imaging location?:   [facility]   Follow up 1 week All questions were answered. The patient knows to call the clinic with any problems, questions or concerns.  Call Babara, MD, PhD [facility] Hematology Oncology 12/02/2024   CHIEF COMPLAINTS/REASON FOR VISIT:  Secondary erythrocytosis  HISTORY OF PRESENTING ILLNESS:  Thomas Palmer is a 84 y.o. male who was seen in consultation at the request of Babara Call, MD for evaluation of polycytosis/erythrocytosis Reviewed recent labs done at the primary care provider's office. 12/14/2020, hemoglobin  19.4, hematocrit 59.2, MCV 94.7, WBC 5.6, Chronic Onset, intermittent, duration since 2020. No aggravating or alleviating factors.   Associated signs or symptoms: Denies weight loss, fever, chills, fatigue, night sweats.   Context:  Smoking history: Former smoker. Testosterone  supplements: Patient is on testosterone  cream. History of blood clots: Denies Daytime somnolence: Denies Family history of polycythemia: Denies  He is on testosterone  replacement therapy-75 mg.  INTERVAL HISTORY Thomas Palmer is a 84 y.o. male who has above history reviewed by me today presents for follow up visit for secondary erythrocytosis, low-grade urothelial carcinoma. Patient reports feeling well today Oncology History  Urothelial carcinoma of bladder (HCC)  11/15/2024 Initial Diagnosis   Urothelial carcinoma of bladder  09/16/2023, TA urothelial carcinoma of bladder-low-grade status post TURBT for multi focal bladder tumors.  Largest was 3 cm anterior bladder neck.  Patient received post resection gemcitabine .  Declined 6-week course of intravesical chemotherapy.  10/07/2024 he developed recurrence.   Cystoscopy findings Urethra normal Without strictures; prominent lateral lobe enlargement with hypervascularity; UOs normal-appearing bilaterally; moderate bladder trabeculation with cellules The following bladder tumors were identified: ~5 mm papillary tumor upper posterior wall ~3 mm elongated papillary tumor right posterior wall  Pathology showed 1. Bladder, transurethral resection, right posterior wall tumor :       - LOW-GRADE PAPILLARY UROTHELIAL CARCINOMA   2. Bladder, transurethral resection, upper posterior bladder wall tumor :       - LOW-GRADE PAPILLARY UROTHELIAL CARCINOMA     11/15/2024 Cancer Staging   Staging form: Urinary Bladder, AJCC 8th Edition - Clinical stage from 11/15/2024: Stage 0a (rcTa, rcN0, rcM0) - Signed by Babara Call, MD on 11/15/2024 Stage prefix: Recurrence    11/25/2024 -  Chemotherapy   Patient  is on Treatment Plan : BLADDER Gemcitabine  INTRAVESICAL (2000) q7d      Patient present to discuss intravesical gemcitabine  treatments. He reports feeling well.  Denies any dysuria, urinary difficulty, urgency.  He endorses frequent urination.  Review of Systems  Constitutional:  Positive for fatigue. Negative for appetite change, chills, fever and unexpected weight change.  HENT:   Negative for hearing loss and voice change.   Eyes:  Negative for eye problems and icterus.  Respiratory:  Negative for chest tightness, cough and shortness of breath.   Cardiovascular:  Negative for chest pain and leg swelling.  Gastrointestinal:  Negative for abdominal distention and abdominal pain.  Endocrine: Negative for hot flashes.  Genitourinary:  Positive for frequency. Negative for difficulty urinating and dysuria.   Musculoskeletal:  Negative for arthralgias.  Skin:  Negative for itching and rash.  Neurological:  Negative for light-headedness and numbness.  Hematological:  Negative for adenopathy. Does not bruise/bleed easily.  Psychiatric/Behavioral:  Negative for confusion.     MEDICAL HISTORY:  Past Medical History:  Diagnosis Date   Actinic keratosis    Acute appendicitis    Arthritis    joints in hips and fingers; shoulders too (07/13/2013)   Bladder cancer (HCC)    Chronic lower back pain    COPD (chronic obstructive pulmonary disease) (HCC)    GOLD stage 3   Depression    Diverticulosis of large intestine without hemorrhage    Erythrocytosis 01/01/2021   Gastric ulcer    H/O hiatal hernia    Hyperlipidemia    Hypertension    Hypothyroidism    IDA (iron deficiency anemia)    OP (osteoporosis)    Pneumonia 08/2011   SCC (squamous cell carcinoma) 10/04/2024   right intertragus, tx Mohs Dr. Corey 1.20.2026   Spinal stenosis of lumbar region with neurogenic claudication     SURGICAL HISTORY: Past Surgical History:  Procedure Laterality  Date   ACHILLES TENDON REPAIR Left 2021   APPENDECTOMY     BACK SURGERY     kyphoplasty   BLADDER INSTILLATION N/A 09/16/2023   Procedure: BLADDER INSTILLATION OF GEMCITABINE ;  Surgeon: Twylla Glendia BROCKS, MD;  Location: [facility] OR[facility];  Service: Urology;  Laterality: N/A;   BLADDER INSTILLATION N/A 10/07/2024   Procedure: INSTILLATION, BLADDER;  Surgeon: Twylla Glendia BROCKS, MD;  Location: [facility];  Service: Urology;  Laterality: N/A;  W/ Gemcitabine    CARDIAC CATHETERIZATION  ~ 2009   COLONOSCOPY WITH PROPOFOL  N/A 05/03/2019   Procedure: COLONOSCOPY WITH PROPOFOL ;  Surgeon: Gaylyn Gladis PENNER, MD;  Location[facility];  Service: Endoscopy;  Laterality: N/A;   CYSTOSCOPY WITH BIOPSY N/A 10/07/2024   Procedure: CYSTOSCOPY, WITH BIOPSY;  Surgeon: Twylla Glendia BROCKS, MD;  Location: [facility] OR[facility];  Service: Urology;  Laterality: N/A;  bladder biposy and fulgeration   FIXATION KYPHOPLASTY LUMBAR SPINE  ~ 2012   L1 (07/13/2013)   JOINT REPLACEMENT     LAPAROSCOPIC APPENDECTOMY N/A 02/22/2018   Procedure: APPENDECTOMY LAPAROSCOPIC;  Surgeon: Nicholaus Selinda Birmingham, MD;  Location: [facility] ORS;  Service: General;  Laterality: N/A;   LUMBAR LAMINECTOMY/DECOMPRESSION MICRODISCECTOMY Bilateral 05/08/2023   Procedure: Lumbar three-four, Lumbar four-five LAMINOTOMY, FORAMINOTOMY;  Surgeon: Mavis Purchase, MD;  Location: [facility] OR;  Service: Neurosurgery;  Laterality: Bilateral;   TOTAL HIP ARTHROPLASTY Left 07/12/2013   Procedure: TOTAL HIP ARTHROPLASTY;  Surgeon: Marcey Raman, MD;  Location: [facility] OR;  Service: Orthopedics;  Laterality: Left;   TOTAL HIP ARTHROPLASTY Right 05/17/2002   TRANSURETHRAL RESECTION OF BLADDER TUMOR N/A 09/16/2023  Procedure: TRANSURETHRAL RESECTION OF BLADDER TUMOR (TURBT);  Surgeon: Twylla Glendia BROCKS, MD;  Location: [facility] ORS;  Service: Urology;  Laterality: N/A;    SOCIAL HISTORY: Social History   Socioeconomic History   Marital status: Married    Spouse name: brenda   Number of children:  Not on file   Years of education: Not on file   Highest education level: Not on file  Occupational History   Not on file  Tobacco Use   Smoking status: Former    Types: Pipe    Quit date: 07/07/1989    Years since quitting: 35.4    Passive exposure: Never   Smokeless tobacco: Former    Types: Chew    Quit date: 10/28/1989  Vaping Use   Vaping status: Never Used  Substance and Sexual Activity   Alcohol  use: Yes    Alcohol /week: 5.0 standard drinks of alcohol     Types: 5 Cans of beer per week   Drug use: No   Sexual activity: Yes  Other Topics Concern   Not on file  Social History Narrative   Not on file   Social Drivers of Health   Tobacco Use: Medium Risk (12/02/2024)   Patient History    Smoking Tobacco Use: Former    Smokeless Tobacco Use: Former    Passive Exposure: Never  Programmer, Applications: Low Risk  (07/22/2024)   Received from [facility]   Overall Financial Resource Strain (CARDIA)    Difficulty of Paying Living Expenses: Not hard at all  Food Insecurity: No Food Insecurity (07/22/2024)   Received from [facility]   Epic    Within the past 12 months, you worried that your food would run out before you got the money to buy more.: Never true    Within the past 12 months, the food you bought just didn't last and you didn't have money to get more.: Never true  Transportation Needs: No Transportation Needs (07/22/2024)   [facility] from [facility] - Transportation    In the past 12 months, has lack of transportation kept you from medical appointments or from getting medications?: No    Lack of Transportation (Non-Medical): No  Physical Activity: Not on file  Stress: Not on file  Social Connections: Not on file  Intimate Partner Violence: Not on file  Depression (PHQ2-9): Low Risk (12/02/2024)   Depression (PHQ2-9)    PHQ-2 Score: 0  Alcohol  Screen: Not on file  Housing: Low Risk  (07/22/2024)    Received from [facility]   Epic    In the last 12 months, was there a time when you were not able to pay the mortgage or rent on time?: No    In the past 12 months, how many times have you moved where you were living?: 0    At any time in the past 12 months, were you homeless or living in a shelter (including now)?: No  Utilities: Not At Risk (07/22/2024)   Received from [facility]   Epic    In the past 12 months has the electric, gas, oil, or water  company threatened to shut off services in your home?: No  Health Literacy: Not on file    FAMILY HISTORY: Family History  Problem Relation Age of Onset   Skin cancer Father    Stomach cancer Maternal Grandmother     ALLERGIES:  is allergic to contrast media [iodinated contrast  media] and iodine.  MEDICATIONS:  Current Outpatient Medications  Medication Sig Dispense Refill   Alpha-Lipoic Acid 300 MG TABS Take 300 mg by mouth daily.     Ascorbic Acid (VITAMIN C WITH ROSE HIPS) 500 MG tablet Take 500 mg by mouth daily.     calcium citrate (CALCITRATE - DOSED IN MG ELEMENTAL CALCIUM) 950 (200 Ca) MG tablet Take 200 mg of elemental calcium by mouth daily.     Cholecalciferol 125 MCG (5000 UT) TABS Take 5,000 Units by mouth daily.     finasteride  (PROSCAR ) 5 MG tablet Take 5 mg by mouth daily.     fluorouracil  (EFUDEX ) 5 % cream Apply to aa arm twice daily until red and irritated up to 5 weeks. 40 g 0   Fluticasone -Umeclidin-Vilant (TRELEGY ELLIPTA) 100-62.5-25 MCG/ACT AEPB Inhale 1 puff into the lungs daily.     gentamicin  ointment (GARAMYCIN ) 0.1 % Apply 1 Application topically 3 (three) times daily. 15 g 0   levothyroxine  (SYNTHROID ) 50 MCG tablet Take 50 mcg by mouth daily before breakfast. Take with 200 mcg to equal 250 mcg daily     levothyroxine  (SYNTHROID , LEVOTHROID) 200 MCG tablet Take 200 mcg by mouth daily before breakfast. Take with 50 mcg to equal 250 mcg daily     LITHIUM OROTATE PO Take 240  mg by mouth daily.     losartan (COZAAR) 50 MG tablet Take 50 mg by mouth daily.     Multiple Vitamins-Minerals (CENTRUM SILVER 50+MEN PO) Take 1 tablet by mouth daily.     Multiple Vitamins-Minerals (PRESERVISION AREDS 2) CAPS Take 1 capsule by mouth 2 (two) times daily.     mupirocin  ointment (BACTROBAN ) 2 % Apply 1 Application topically 2 (two) times daily. 22 g 1   ondansetron  (ZOFRAN ) 8 MG tablet Take 1 tablet (8 mg total) by mouth every 8 (eight) hours as needed for nausea or vomiting. 30 tablet 1   tadalafil (CIALIS) 20 MG tablet Take 20 mg by mouth daily as needed for erectile dysfunction.     TESTOSTERONE  COMPOUNDING KIT TD Place 1 mL onto the skin daily. TESTOSTERONE  75MG /ML VERSATILE CR  4 clicks     Turmeric (QC TUMERIC COMPLEX PO) Take 800 mg by mouth daily.     zinc  gluconate 50 MG tablet Take 50 mg by mouth daily.     No current facility-administered medications for this visit.   Facility-Administered Medications Ordered in Other Visits  Medication Dose Route Frequency Provider Last Rate Last Admin   0.9 %  sodium chloride  infusion   Intravenous Continuous Babara Call, MD         PHYSICAL EXAMINATION: ECOG PERFORMANCE STATUS: 1 - Symptomatic but completely ambulatory Vitals:   12/02/24 1333  BP: 124/74  Pulse: 62  Resp: 18  Temp: (!) 96 F (35.6 C)  SpO2: 94%   Filed Weights   12/02/24 1333  Weight: 191 lb 12.8 oz (87 kg)    Physical Exam Constitutional:      General: He is not in acute distress. HENT:     Head: Normocephalic and atraumatic.  Eyes:     General: No scleral icterus. Cardiovascular:     Rate and Rhythm: Normal rate and regular rhythm.  Pulmonary:     Effort: Pulmonary effort is normal. No respiratory distress.  Abdominal:     General: There is no distension.  Musculoskeletal:        General: No deformity. Normal range of motion.     Cervical back: Normal  range of motion and neck supple.  Skin:    General: Skin is warm.     Findings: No  erythema or rash.  Neurological:     Mental Status: He is alert and oriented to person, place, and time. Mental status is at baseline.  Psychiatric:        Mood and Affect: Mood normal.     RADIOGRAPHIC STUDIES: I have personally reviewed the radiological images as listed and agreed with the findings in the report. No results found.   LABORATORY DATA:  I have reviewed the data as listed    Latest Ref Rng & Units 12/02/2024    1:07 PM 11/25/2024   12:45 PM 09/29/2024    8:46 AM  CBC  WBC 4.0 - 10.5 K/uL 6.7  6.6  4.5   Hemoglobin 13.0 - 17.0 g/dL 82.5  82.4  83.2   Hematocrit 39.0 - 52.0 % 51.8  53.2  51.5   Platelets 150 - 400 K/uL 144  144  153     "

## 2024-12-02 NOTE — Assessment & Plan Note (Addendum)
 Increase of creatinine is pre-existing to first dose of intravesical gemcitabine . Encourage oral hydration.  Cr is trending up.  Patient denies urinary retention/urination difficulty symptoms.  No dysuria.  Chronically he has increased frequency.   Obtain ultrasound renal for further evaluation.

## 2024-12-07 ENCOUNTER — Ambulatory Visit

## 2024-12-09 ENCOUNTER — Inpatient Hospital Stay: Admitting: Oncology

## 2024-12-09 ENCOUNTER — Inpatient Hospital Stay

## 2024-12-16 ENCOUNTER — Inpatient Hospital Stay

## 2024-12-16 ENCOUNTER — Ambulatory Visit: Admitting: Dermatology

## 2024-12-16 ENCOUNTER — Inpatient Hospital Stay: Admitting: Oncology

## 2024-12-23 ENCOUNTER — Inpatient Hospital Stay

## 2024-12-23 ENCOUNTER — Inpatient Hospital Stay: Admitting: Oncology

## 2024-12-30 ENCOUNTER — Inpatient Hospital Stay

## 2024-12-30 ENCOUNTER — Inpatient Hospital Stay: Admitting: Oncology

## 2025-01-19 ENCOUNTER — Other Ambulatory Visit

## 2025-01-19 ENCOUNTER — Encounter

## 2025-02-15 ENCOUNTER — Other Ambulatory Visit: Admitting: Urology

## 2025-03-28 ENCOUNTER — Ambulatory Visit: Admitting: Dermatology

## 2025-07-22 ENCOUNTER — Other Ambulatory Visit

## 2025-07-22 ENCOUNTER — Ambulatory Visit: Admitting: Oncology

## 2025-07-22 ENCOUNTER — Encounter
